# Patient Record
Sex: Female | Born: 1995 | Race: White | Hispanic: No | Marital: Married | Smoking: Former smoker
Health system: Southern US, Community
[De-identification: ages and names within clinical notes are randomized; demographics above are authoritative.]

## PROBLEM LIST (undated history)

## (undated) DIAGNOSIS — O24419 Gestational diabetes mellitus in pregnancy, unspecified control: Secondary | ICD-10-CM

## (undated) DIAGNOSIS — O2230 Deep phlebothrombosis in pregnancy, unspecified trimester: Secondary | ICD-10-CM

## (undated) DIAGNOSIS — R112 Nausea with vomiting, unspecified: Secondary | ICD-10-CM

## (undated) DIAGNOSIS — G43909 Migraine, unspecified, not intractable, without status migrainosus: Secondary | ICD-10-CM

## (undated) DIAGNOSIS — Z9889 Other specified postprocedural states: Secondary | ICD-10-CM

## (undated) DIAGNOSIS — L309 Dermatitis, unspecified: Secondary | ICD-10-CM

## (undated) DIAGNOSIS — J302 Other seasonal allergic rhinitis: Secondary | ICD-10-CM

## (undated) DIAGNOSIS — Z8489 Family history of other specified conditions: Secondary | ICD-10-CM

## (undated) DIAGNOSIS — G47419 Narcolepsy without cataplexy: Secondary | ICD-10-CM

## (undated) DIAGNOSIS — J45909 Unspecified asthma, uncomplicated: Secondary | ICD-10-CM

## (undated) DIAGNOSIS — F4310 Post-traumatic stress disorder, unspecified: Secondary | ICD-10-CM

## (undated) HISTORY — DX: Nausea with vomiting, unspecified: R11.2

## (undated) HISTORY — DX: Narcolepsy without cataplexy: G47.419

## (undated) HISTORY — PX: WISDOM TOOTH EXTRACTION: SHX21

## (undated) HISTORY — DX: Dermatitis, unspecified: L30.9

## (undated) HISTORY — DX: Deep phlebothrombosis in pregnancy, unspecified trimester: O22.30

## (undated) HISTORY — DX: Migraine, unspecified, not intractable, without status migrainosus: G43.909

## (undated) HISTORY — DX: Family history of other specified conditions: Z84.89

## (undated) HISTORY — DX: Post-traumatic stress disorder, unspecified: F43.10

## (undated) HISTORY — DX: Nausea with vomiting, unspecified: Z98.890

## (undated) HISTORY — DX: Gestational diabetes mellitus in pregnancy, unspecified control: O24.419

## (undated) HISTORY — PX: TONSILLECTOMY: SUR1361

---
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
## 2018-01-04 ENCOUNTER — Other Ambulatory Visit: Payer: Self-pay

## 2018-01-04 ENCOUNTER — Emergency Department (HOSPITAL_COMMUNITY)
Admission: EM | Admit: 2018-01-04 | Discharge: 2018-01-04 | Disposition: A | Discharge status: Home / Self Care | Payer: Self-pay | Attending: Emergency Medicine | Admitting: Emergency Medicine

## 2018-01-04 ENCOUNTER — Encounter (HOSPITAL_COMMUNITY): Payer: Self-pay

## 2018-01-04 DIAGNOSIS — R112 Nausea with vomiting, unspecified: Secondary | ICD-10-CM | POA: Insufficient documentation

## 2018-01-04 DIAGNOSIS — R69 Illness, unspecified: Secondary | ICD-10-CM

## 2018-01-04 DIAGNOSIS — R197 Diarrhea, unspecified: Secondary | ICD-10-CM | POA: Insufficient documentation

## 2018-01-04 DIAGNOSIS — J111 Influenza due to unidentified influenza virus with other respiratory manifestations: Secondary | ICD-10-CM

## 2018-01-04 DIAGNOSIS — R5383 Other fatigue: Secondary | ICD-10-CM | POA: Insufficient documentation

## 2018-01-04 DIAGNOSIS — R52 Pain, unspecified: Secondary | ICD-10-CM | POA: Insufficient documentation

## 2018-01-04 LAB — PREGNANCY, URINE: Preg Test, Ur: NEGATIVE

## 2018-01-04 LAB — COMPREHENSIVE METABOLIC PANEL
ALBUMIN: 4 g/dL (ref 3.5–5.0)
ALT: 16 U/L (ref 14–54)
ANION GAP: 11 (ref 5–15)
AST: 21 U/L (ref 15–41)
Alkaline Phosphatase: 85 U/L (ref 38–126)
BUN: 18 mg/dL (ref 6–20)
CHLORIDE: 104 mmol/L (ref 101–111)
CO2: 23 mmol/L (ref 22–32)
Calcium: 8.7 mg/dL — ABNORMAL LOW (ref 8.9–10.3)
Creatinine, Ser: 0.67 mg/dL (ref 0.44–1.00)
GFR calc Af Amer: >60 mL/min (ref >60)
GFR calc non Af Amer: >60 mL/min (ref >60)
GLUCOSE: 99 mg/dL (ref 65–99)
POTASSIUM: 3.7 mmol/L (ref 3.5–5.1)
Sodium: 138 mmol/L (ref 135–145)
Total Bilirubin: 1 mg/dL (ref 0.3–1.2)
Total Protein: 7 g/dL (ref 6.5–8.1)

## 2018-01-04 LAB — URINALYSIS, ROUTINE W REFLEX MICROSCOPIC
Bilirubin Urine: NEGATIVE
Glucose, UA: NEGATIVE mg/dL
Hgb urine dipstick: NEGATIVE
Ketones, ur: NEGATIVE mg/dL
Nitrite: NEGATIVE
Protein, ur: 30 mg/dL — AB
Specific Gravity, Urine: 1.034 — ABNORMAL HIGH (ref 1.005–1.030)
pH: 5 (ref 5.0–8.0)

## 2018-01-04 LAB — TSH: TSH: 0.71 u[IU]/mL (ref 0.350–4.500)

## 2018-01-04 LAB — CBC
HCT: 45.1 % (ref 36.0–46.0)
Hemoglobin: 14.9 g/dL (ref 12.0–15.0)
MCH: 27.7 pg (ref 26.0–34.0)
MCHC: 33 g/dL (ref 30.0–36.0)
MCV: 84 fL (ref 78.0–100.0)
Platelets: 184 10*3/uL (ref 150–400)
RBC: 5.37 MIL/uL — ABNORMAL HIGH (ref 3.87–5.11)
RDW: 13.2 % (ref 11.5–15.5)
WBC: 6.9 10*3/uL (ref 4.0–10.5)

## 2018-01-04 LAB — LIPASE, BLOOD: LIPASE: 28 U/L (ref 11–51)

## 2018-01-04 LAB — T4, FREE: Free T4: 0.8 ng/dL (ref 0.61–1.12)

## 2018-01-04 MED ORDER — KETOROLAC TROMETHAMINE 30 MG/ML IJ SOLN
15.0000 mg | Freq: Once | INTRAMUSCULAR | Status: DC
  Filled 2018-01-04: qty 1

## 2018-01-04 MED ORDER — KETOROLAC TROMETHAMINE 30 MG/ML IJ SOLN
15.0000 mg | Freq: Once | INTRAMUSCULAR | Status: AC
  Administered 2018-01-04: 15 mg via INTRAVENOUS

## 2018-01-04 MED ORDER — TRAMADOL HCL 50 MG PO TABS
50.0000 mg | ORAL_TABLET | Freq: Once | ORAL | Status: AC
  Administered 2018-01-04: 50 mg via ORAL
  Filled 2018-01-04: qty 1

## 2018-01-04 NOTE — ED Provider Notes (Signed)
[facility] Provider Note   CSN: [id number] Arrival date & time: 01/04/18  1040     History   Chief Complaint Chief Complaint  Patient presents with  . Generalized Body Aches  . Emesis    HPI Amy [name] is a 22 y.o. female.  HPI   Patient presents with concern of 3/4 weeks of illness. Prior to the onset she was in her usual state of health, and denies any chronic medical problems. Now, over the past few weeks she has had persistent nausea, anorexia, diminished p.o. intake due to this. There is concurrent fatigue, lack of previously normal energy. Patient also comments of ongoing intermittent loose stool, and has had episodic vomiting as well. Vomiting is approximately 1 hour postprandial, and random. No focal abdominal pain, no chest pain, no dyspnea. There is associated headache, though she notes a long history of migraine headache. This headache is not as severe as her migraines, but is in the posterior midline occiput and neck. No relief with OTC medication. No vision changes, no confusion, no disorientation. Patient does not smoke, drinks only socially.  History reviewed. No pertinent past medical history.  There are no active problems to display for this patient.   Past Surgical History:  Procedure Laterality Date  . TONSILLECTOMY    . WISDOM TOOTH EXTRACTION      OB History    No data available       Home Medications    Prior to Admission medications   Not on File    Family History No family history on file.  Social History Social History   Tobacco Use  . Smoking status: Never Smoker  . Smokeless tobacco: Never Used  Substance Use Topics  . Alcohol use: No    Frequency: Never  . Drug use: No     Allergies   Cough syrup [guaifenesin]   Review of Systems Review of Systems  Constitutional:       Per HPI, otherwise negative  HENT:       Per HPI, otherwise negative  Respiratory:       Per HPI, otherwise  negative  Cardiovascular:       Per HPI, otherwise negative  Gastrointestinal: Positive for diarrhea, nausea and vomiting. Negative for abdominal pain.  Endocrine:       Negative aside from HPI  Genitourinary:       Neg aside from HPI   Musculoskeletal:       Per HPI, otherwise negative  Skin: Negative.   Neurological: Positive for headaches. Negative for syncope.     Physical Exam Updated Vital Signs BP 124/69 (BP Location: Left Arm)   Pulse 90   Temp 99 F (37.2 C) (Oral)   Resp 18   Ht 5\' 3"  (1.6 m)   Wt 74.4 kg (164 lb 1.6 oz)   LMP 11/23/2017   SpO2 100%   BMI 29.07 kg/m   Physical Exam  Constitutional: She is oriented to person, place, and time. She appears well-developed and well-nourished. No distress.  HENT:  Head: Normocephalic and atraumatic.  Eyes: Conjunctivae and EOM are normal.  Neck: Normal range of motion. Neck supple. No spinous process tenderness and no muscular tenderness present. No neck rigidity. No edema, no erythema and normal range of motion present.  Cardiovascular: Normal rate and regular rhythm.  Pulmonary/Chest: Effort normal and breath sounds normal. No stridor. No respiratory distress.  Abdominal: She exhibits no distension.  Musculoskeletal: She exhibits no edema.  Neurological:  She is alert and oriented to person, place, and time. No cranial nerve deficit.  Skin: Skin is warm and dry.  Psychiatric: She has a normal mood and affect.  Nursing note and vitals reviewed.    ED Treatments / Results  Labs (all labs ordered are listed, but only abnormal results are displayed) Labs Reviewed  COMPREHENSIVE METABOLIC PANEL - Abnormal; Notable for the following components:      Result Value   Calcium 8.7 (*)    All other components within normal limits  CBC - Abnormal; Notable for the following components:   RBC 5.37 (*)    All other components within normal limits  URINALYSIS, ROUTINE W REFLEX MICROSCOPIC - Abnormal; Notable for the  following components:   Color, Urine AMBER (*)    APPearance CLOUDY (*)    Specific Gravity, Urine 1.034 (*)    Protein, ur 30 (*)    Leukocytes, UA SMALL (*)    Bacteria, UA RARE (*)    Squamous Epithelial / LPF TOO NUMEROUS TO COUNT (*)    All other components within normal limits  LIPASE, BLOOD  PREGNANCY, URINE  TSH  T4, FREE     Procedures Procedures (including critical care time)  Medications Ordered in ED Medications  traMADol (ULTRAM) tablet 50 mg (not administered)  ketorolac (TORADOL) 30 MG/ML injection 15 mg (not administered)     Initial Impression / Assessment and Plan / ED Course  I have reviewed the triage vital signs and the nursing notes.  Pertinent labs & imaging results that were available during my care of the patient were reviewed by me and considered in my medical decision making (see chart for details).  4:16 PM Awake alert in no distress speaking clearly. No new complaints. We discussed all findings, including reassuring labs, urinalysis. With no abnormal breath sounds, and non-smoking status, no x-ray indicated.  This young female presents with ongoing generalized illness including nausea, vomiting, diarrhea, but has a reassuring physical exam, vital signs, and given the time course, though she may have previously had influenza, no indication for antiviral. After hours of monitoring with no decompensation, no new complaints, the patient was discharged with close outpatient follow-up.  Final Clinical Impressions(s) / ED Diagnoses  Influenza-like illness   Gerhard Munch, MD 01/04/18 [phone]

## 2018-01-04 NOTE — ED Triage Notes (Signed)
Reports of body aches, vomiting, diarrhea and headache x3 weeks.

## 2018-01-04 NOTE — Discharge Instructions (Signed)
As discussed, your evaluation today has been largely reassuring.  But, it is important that you monitor your condition carefully, and do not hesitate to return to the ED if you develop new, or concerning changes in your condition. ? ?Otherwise, please follow-up with your physician for appropriate ongoing care. ? ?

## 2018-01-26 ENCOUNTER — Encounter (HOSPITAL_COMMUNITY): Payer: Self-pay

## 2018-01-26 ENCOUNTER — Emergency Department (HOSPITAL_COMMUNITY)
Admission: EM | Admit: 2018-01-26 | Discharge: 2018-01-26 | Disposition: A | Discharge status: Home / Self Care | Payer: Self-pay | Attending: Emergency Medicine | Admitting: Emergency Medicine

## 2018-01-26 DIAGNOSIS — R112 Nausea with vomiting, unspecified: Secondary | ICD-10-CM | POA: Insufficient documentation

## 2018-01-26 DIAGNOSIS — R197 Diarrhea, unspecified: Secondary | ICD-10-CM | POA: Insufficient documentation

## 2018-01-26 DIAGNOSIS — J4531 Mild persistent asthma with (acute) exacerbation: Secondary | ICD-10-CM | POA: Insufficient documentation

## 2018-01-26 DIAGNOSIS — R062 Wheezing: Secondary | ICD-10-CM

## 2018-01-26 DIAGNOSIS — Z79899 Other long term (current) drug therapy: Secondary | ICD-10-CM | POA: Insufficient documentation

## 2018-01-26 HISTORY — DX: Unspecified asthma, uncomplicated: J45.909

## 2018-01-26 MED ORDER — ONDANSETRON 4 MG PO TBDP: 4.0000 mg | ORAL_TABLET | Freq: Three times a day (TID) | ORAL | 0 refills | Status: DC | PRN

## 2018-01-26 MED ORDER — ALBUTEROL SULFATE (2.5 MG/3ML) 0.083% IN NEBU
5.0000 mg | INHALATION_SOLUTION | Freq: Once | RESPIRATORY_TRACT | Status: AC
  Administered 2018-01-26: 5 mg via RESPIRATORY_TRACT
  Filled 2018-01-26: qty 6

## 2018-01-26 MED ORDER — PREDNISONE 20 MG PO TABS: 40.0000 mg | ORAL_TABLET | Freq: Every day | ORAL | 0 refills | Status: DC

## 2018-01-26 MED ORDER — ONDANSETRON 4 MG PO TBDP
4.0000 mg | ORAL_TABLET | Freq: Once | ORAL | Status: AC
  Administered 2018-01-26: 4 mg via ORAL
  Filled 2018-01-26: qty 1

## 2018-01-26 NOTE — Discharge Instructions (Signed)
Zofran every 6 hours as needed Albuterol every 4 hours as needed for wheezing Prednisone daily for 5 days  Out of work until not having vomiting or diarrhea

## 2018-01-26 NOTE — ED Provider Notes (Signed)
[facility] EMERGENCY DEPARTMENT Provider Note   CSN: [id number] Arrival date & time: 01/26/18  1029     History   Chief Complaint Chief Complaint  Patient presents with  . Emesis  . Diarrhea    HPI ARLISS HEPBURN is a 22 y.o. female.  HPI  The patient is a 22 year old female, she has a history of asthma, she works at [employer] and states that over the last several days she has had several coworkers which have gone out with vomiting and diarrhea, last night she developed vomiting and diarrhea and had a fever of 102, that has subsided this morning but she still has ongoing watery diarrhea which is nonbloody as well as multiple episodes of vomiting when she tries to eat food.  She does report that she has been able to drink over a gallon of water yesterday and has been able to hold that down without any difficulty.  She is also noticed an increase in the amount of wheezing and coughing that she is doing over the last several days.  She does use her rescue inhaler as needed and uses Singulair for coughing related to allergies.  She still uses those medications.   Past Medical History:  Diagnosis Date  . Asthma     There are no active problems to display for this patient.   Past Surgical History:  Procedure Laterality Date  . TONSILLECTOMY    . WISDOM TOOTH EXTRACTION      OB History    No data available       Home Medications    Prior to Admission medications   Medication Sig Start Date End Date Taking? Authorizing Provider  acetaminophen (TYLENOL) 325 MG tablet Take 650 mg by mouth every 6 (six) hours as needed for mild pain or headache.   Yes [provider]  albuterol (PROVENTIL HFA;VENTOLIN HFA) 108 (90 Base) MCG/ACT inhaler Inhale 1-2 puffs into the lungs every 6 (six) hours as needed for wheezing or shortness of breath.   Yes [provider]  Cinnamon 500 MG capsule Take 500 mg by mouth daily.   Yes [provider]  magnesium  gluconate (MAGONATE) 500 MG tablet Take 500 mg by mouth daily.   Yes [provider]  OLIVE LEAF EXTRACT PO Take 1 capsule by mouth daily.   Yes [provider]  vitamin C (ASCORBIC ACID) 500 MG tablet Take 500 mg by mouth daily.   Yes [provider]  montelukast (SINGULAIR) 10 MG tablet Take 10 mg by mouth at bedtime.    [provider]  ondansetron (ZOFRAN ODT) 4 MG disintegrating tablet Take 1 tablet (4 mg total) by mouth every 8 (eight) hours as needed for nausea. 01/26/18   Eber Hong, MD  predniSONE (DELTASONE) 20 MG tablet Take 2 tablets (40 mg total) by mouth daily. 01/26/18   Eber Hong, MD    Family History No family history on file.  Social History Social History   Tobacco Use  . Smoking status: Never Smoker  . Smokeless tobacco: Never Used  Substance Use Topics  . Alcohol use: Yes    Frequency: Never    Comment: occ  . Drug use: No     Allergies   Cough syrup [guaifenesin] and Other   Review of Systems Review of Systems  All other systems reviewed and are negative.    Physical Exam Updated Vital Signs BP 132/62   Pulse 77   Temp 98.6 F (37 C) (Oral)  Resp 18   Ht 5\' 3"  (1.6 m)   Wt 74.1 kg (163 lb 6.4 oz)   LMP 01/19/2018 (Exact Date)   SpO2 98%   BMI 28.95 kg/m   Physical Exam  Constitutional: She appears well-developed and well-nourished. No distress.  HENT:  Head: Normocephalic and atraumatic.  Mouth/Throat: Oropharynx is clear and moist. No oropharyngeal exudate.  Oral cavity is clear, moist, no redness exudate or asymmetry  Eyes: Conjunctivae and EOM are normal. Pupils are equal, round, and reactive to light. Right eye exhibits no discharge. Left eye exhibits no discharge. No scleral icterus.  Neck: Normal range of motion. Neck supple. No JVD present. No thyromegaly present.  Cardiovascular: Normal rate, regular rhythm, normal heart sounds and intact distal pulses. Exam reveals no gallop and no friction  rub.  No murmur heard. Pulmonary/Chest: Effort normal. No respiratory distress. She has wheezes ( The patient has expiratory wheezing but is able to speak in full sentences without any increased work of breathing or distress). She has no rales.  Abdominal: Soft. Bowel sounds are normal. She exhibits no distension and no mass. There is tenderness ( Mild diffuse abdominal tenderness, there is no guarding, the abdomen is very soft, there is no focal tenderness in the right lower quadrant and there is no Murphy sign).  Musculoskeletal: Normal range of motion. She exhibits no edema or tenderness.  No edema  Lymphadenopathy:    She has no cervical adenopathy.  Neurological: She is alert. Coordination normal.  The patient is alert, she is able to follow all my commands, she is able to walk without any difficulty with normal balance and normal speech  Skin: Skin is warm and dry. No rash noted. No erythema.  Psychiatric: She has a normal mood and affect. Her behavior is normal.  Nursing note and vitals reviewed.    ED Treatments / Results  Labs (all labs ordered are listed, but only abnormal results are displayed) Labs Reviewed - No data to display   Radiology No results found.  Procedures Procedures (including critical care time)  Medications Ordered in ED Medications  ondansetron (ZOFRAN-ODT) disintegrating tablet 4 mg (4 mg Oral Given 01/26/18 1154)  albuterol (PROVENTIL) (2.5 MG/3ML) 0.083% nebulizer solution 5 mg (5 mg Nebulization Given 01/26/18 1226)     Initial Impression / Assessment and Plan / ED Course  I have reviewed the triage vital signs and the nursing notes.  Pertinent labs & imaging results that were available during my care of the patient were reviewed by me and considered in my medical decision making (see chart for details).     The patient has what appears to be a gastroenteritis, she is afebrile here however with her story of fever with vomiting and diarrhea I  suspect this is a viral cause.  Additionally she has some wheezing for which she will get a nebulized treatment however she does not appear to be in distress from this and has ongoing treatment at home.  Zofran given in the emergency department, Nebulized treatment of albuterol given  Anticipate discharge home.  Improved with meds ,  Vitals:   01/26/18 1045 01/26/18 1227 01/26/18 1251  BP: 111/68  132/62  Pulse: 96  77  Resp: 18  18  Temp: 98.6 F (37 C)    TempSrc: Oral    SpO2: 97% 98% 98%  Weight: 74.1 kg (163 lb 6.4 oz)    Height: 5\' 3"  (1.6 m)       Final Clinical Impressions(s) /  ED Diagnoses   Final diagnoses:  Nausea vomiting and diarrhea  Wheezing  Mild persistent asthma with exacerbation    ED Discharge Orders        Ordered    ondansetron (ZOFRAN ODT) 4 MG disintegrating tablet  Every 8 hours PRN     01/26/18 1312    predniSONE (DELTASONE) 20 MG tablet  Daily     01/26/18 1312       Eber HongMiller, Ignacio Lowder, MD 01/26/18 1313

## 2018-01-26 NOTE — ED Triage Notes (Signed)
Pt reports n/v/d abd pain, and sob since last night.  Reports a lot of her coworkers have had the same symptoms.

## 2018-03-02 ENCOUNTER — Encounter (HOSPITAL_COMMUNITY): Payer: Self-pay | Admitting: *Deleted

## 2018-03-02 ENCOUNTER — Emergency Department (HOSPITAL_COMMUNITY): Payer: Self-pay

## 2018-03-02 ENCOUNTER — Emergency Department (HOSPITAL_COMMUNITY)
Admission: EM | Admit: 2018-03-02 | Discharge: 2018-03-02 | Disposition: A | Discharge status: Home / Self Care | Payer: Self-pay | Attending: Emergency Medicine | Admitting: Emergency Medicine

## 2018-03-02 DIAGNOSIS — J45909 Unspecified asthma, uncomplicated: Secondary | ICD-10-CM | POA: Insufficient documentation

## 2018-03-02 DIAGNOSIS — X501XXA Overexertion from prolonged static or awkward postures, initial encounter: Secondary | ICD-10-CM | POA: Insufficient documentation

## 2018-03-02 DIAGNOSIS — Y929 Unspecified place or not applicable: Secondary | ICD-10-CM | POA: Insufficient documentation

## 2018-03-02 DIAGNOSIS — S8392XA Sprain of unspecified site of left knee, initial encounter: Secondary | ICD-10-CM | POA: Insufficient documentation

## 2018-03-02 DIAGNOSIS — Z79899 Other long term (current) drug therapy: Secondary | ICD-10-CM | POA: Insufficient documentation

## 2018-03-02 DIAGNOSIS — Y999 Unspecified external cause status: Secondary | ICD-10-CM | POA: Insufficient documentation

## 2018-03-02 DIAGNOSIS — Y9389 Activity, other specified: Secondary | ICD-10-CM | POA: Insufficient documentation

## 2018-03-02 HISTORY — DX: Other seasonal allergic rhinitis: J30.2

## 2018-03-02 MED ORDER — DICLOFENAC SODIUM 50 MG PO TBEC: 50.0000 mg | DELAYED_RELEASE_TABLET | Freq: Two times a day (BID) | ORAL | 0 refills | Status: DC

## 2018-03-02 NOTE — ED Notes (Signed)
Hx of knee problems  Getting out of [id number] truck when knee gave way and she hit the running board with same knee

## 2018-03-02 NOTE — ED Triage Notes (Signed)
Pt states her left knee gave out while getting out of her truck today and fell landing on that same knee.  Pt states she is able to put weight on it but very painful.

## 2018-03-02 NOTE — Discharge Instructions (Signed)
Return if any problems.

## 2018-03-02 NOTE — ED Provider Notes (Signed)
[facility] EMERGENCY DEPARTMENT Provider Note   CSN: [id number] Arrival date & time: 03/02/18  1712     History   Chief Complaint Chief Complaint  Patient presents with  . Knee Pain    left    HPI Amy [name] is a 22 y.o. female.  The history is provided by the patient. No language interpreter was used.  Knee Pain   This is a new problem. The current episode started 1 to 2 hours ago. The problem occurs constantly. The problem has been gradually worsening. The pain is present in the left knee. The quality of the pain is described as aching. The pain is moderate. Associated symptoms include limited range of motion. She has tried nothing for the symptoms. The treatment provided no relief. There has been a history of trauma.  Pt reports she slipped on a running board and twisted/hit her knee. Pt complains of swelling and pain.    Past Medical History:  Diagnosis Date  . Asthma   . Seasonal allergies     There are no active problems to display for this patient.   Past Surgical History:  Procedure Laterality Date  . TONSILLECTOMY    . WISDOM TOOTH EXTRACTION      OB History    No data available       Home Medications    Prior to Admission medications   Medication Sig Start Date End Date Taking? Authorizing Provider  acetaminophen (TYLENOL) 325 MG tablet Take 650 mg by mouth every 6 (six) hours as needed for mild pain or headache.    [provider]  albuterol (PROVENTIL HFA;VENTOLIN HFA) 108 (90 Base) MCG/ACT inhaler Inhale 1-2 puffs into the lungs every 6 (six) hours as needed for wheezing or shortness of breath.    [provider]  Cinnamon 500 MG capsule Take 500 mg by mouth daily.    [provider]  magnesium gluconate (MAGONATE) 500 MG tablet Take 500 mg by mouth daily.    [provider]  montelukast (SINGULAIR) 10 MG tablet Take 10 mg by mouth at bedtime.    [provider]  OLIVE LEAF EXTRACT PO Take 1  capsule by mouth daily.    [provider]  ondansetron (ZOFRAN ODT) 4 MG disintegrating tablet Take 1 tablet (4 mg total) by mouth every 8 (eight) hours as needed for nausea. 01/26/18   Eber Hong, MD  predniSONE (DELTASONE) 20 MG tablet Take 2 tablets (40 mg total) by mouth daily. 01/26/18   Eber Hong, MD  vitamin C (ASCORBIC ACID) 500 MG tablet Take 500 mg by mouth daily.    [provider]    Family History History reviewed. No pertinent family history.  Social History Social History   Tobacco Use  . Smoking status: Never Smoker  . Smokeless tobacco: Never Used  Substance Use Topics  . Alcohol use: Yes    Frequency: Never    Comment: occ  . Drug use: No     Allergies   Cough syrup [guaifenesin] and Other   Review of Systems Review of Systems  All other systems reviewed and are negative.    Physical Exam Updated Vital Signs BP 113/81   Pulse 81   Temp 97.8 F (36.6 C) (Oral)   Resp 18   Ht 5\' 3"  (1.6 m)   Wt 63.5 kg (140 lb)   LMP 02/21/2018   SpO2 100%   BMI 24.80 kg/m   Physical Exam  Constitutional: She is  oriented to person, place, and time. She appears well-developed and well-nourished.  HENT:  Head: Normocephalic.  Musculoskeletal: She exhibits edema and tenderness.  Neurological: She is alert and oriented to person, place, and time.  Skin: Skin is warm.  Psychiatric: She has a normal mood and affect.  Nursing note and vitals reviewed.    ED Treatments / Results  Labs (all labs ordered are listed, but only abnormal results are displayed) Labs Reviewed - No data to display  EKG  EKG Interpretation None       Radiology Dg Knee Complete 4 Views Left  Result Date: 03/02/2018 CLINICAL DATA:  Pain after trauma EXAM: LEFT KNEE - COMPLETE 4+ VIEW COMPARISON:  None. FINDINGS: There is a well corticated lucency in the patella. No acute fractures. No joint effusions. No degenerative changes. IMPRESSION: No acute  abnormalities. The well corticated lucency in the patella appears nonacute could be congenital or from previous trauma. Electronically Signed   By: Gerome Samavid  Williams III M.D   On: 03/02/2018 18:02    Procedures Procedures (including critical care time)  Medications Ordered in ED Medications - No data to display   Initial Impression / Assessment and Plan / ED Course  I have reviewed the triage vital signs and the nursing notes.  Pertinent labs & imaging results that were available during my care of the patient were reviewed by me and considered in my medical decision making (see chart for details).     Knee immbolizer placed MDM   Xray shows no acute fracture.  I suspect sprain/tear. Pt placed in immobilizer.  Pt advised to call Dr. Romeo AppleHarrison tomorrow to be seen for evaluation.   Final Clinical Impressions(s) / ED Diagnoses   Final diagnoses:  Sprain of left knee, unspecified ligament, initial encounter    ED Discharge Orders    None    An After Visit Summary was printed and given to the patient.    Elson AreasSofia, Krysti Hickling K, [place] 03/02/18 Corky Crafts1955    Eber HongMiller, Brian, MD 03/04/18 [phone]

## 2018-11-04 ENCOUNTER — Encounter (HOSPITAL_COMMUNITY): Payer: Self-pay | Admitting: Emergency Medicine

## 2018-11-04 ENCOUNTER — Other Ambulatory Visit: Payer: Self-pay

## 2018-11-04 ENCOUNTER — Emergency Department (HOSPITAL_COMMUNITY)
Admission: EM | Admit: 2018-11-04 | Discharge: 2018-11-04 | Disposition: A | Discharge status: Home / Self Care | Payer: Self-pay | Attending: Emergency Medicine | Admitting: Emergency Medicine

## 2018-11-04 DIAGNOSIS — L309 Dermatitis, unspecified: Secondary | ICD-10-CM | POA: Insufficient documentation

## 2018-11-04 DIAGNOSIS — J4541 Moderate persistent asthma with (acute) exacerbation: Secondary | ICD-10-CM | POA: Insufficient documentation

## 2018-11-04 DIAGNOSIS — Z79899 Other long term (current) drug therapy: Secondary | ICD-10-CM | POA: Insufficient documentation

## 2018-11-04 DIAGNOSIS — L0100 Impetigo, unspecified: Secondary | ICD-10-CM | POA: Insufficient documentation

## 2018-11-04 MED ORDER — ALBUTEROL SULFATE HFA 108 (90 BASE) MCG/ACT IN AERS
2.0000 | INHALATION_SPRAY | RESPIRATORY_TRACT | Status: DC
  Administered 2018-11-04: 2 via RESPIRATORY_TRACT
  Filled 2018-11-04: qty 6.7

## 2018-11-04 MED ORDER — SULFAMETHOXAZOLE-TRIMETHOPRIM 800-160 MG PO TABS: 1.0000 | ORAL_TABLET | Freq: Two times a day (BID) | ORAL | 0 refills | Status: AC

## 2018-11-04 MED ORDER — IPRATROPIUM-ALBUTEROL 0.5-2.5 (3) MG/3ML IN SOLN
3.0000 mL | Freq: Once | RESPIRATORY_TRACT | Status: AC
  Administered 2018-11-04: 3 mL via RESPIRATORY_TRACT
  Filled 2018-11-04: qty 3

## 2018-11-04 NOTE — Discharge Instructions (Signed)
Return if any problems.

## 2018-11-04 NOTE — Progress Notes (Signed)
**Note De-Identified Maely Clements Obfuscation** Education for MDI received well by patient and mother. Flow measured for adequate medication delivery. Patient tolerated well.

## 2018-11-04 NOTE — ED Notes (Signed)
Pt called out, states she can't breathe, RA sats at 100 %, pt states hx of asthma, inp. And exp. Wheezing noted.  Neb orders placed and respiratory called.

## 2018-11-04 NOTE — ED Triage Notes (Signed)
Patient complaining of rash to left side of face starting Friday. States she also has rash to other areas of her body "for years." States "my face rash started when my cat touched me."

## 2018-11-05 NOTE — ED Provider Notes (Signed)
[facility] Provider Note   CSN: [id number] Arrival date & time: 11/04/18  1008     History   Chief Complaint Chief Complaint  Patient presents with  . Rash  . Asthma    HPI Amy [name] is a 22 y.o. female.  The history is provided by the patient. No language interpreter was used.  Rash   This is a new problem. The current episode started yesterday. The problem has been gradually worsening. There has been no fever. The fever has been present for less than 1 day. The rash is present on the face and back. The pain is moderate. The pain has been constant since onset. Associated symptoms include blisters.  Asthma  This is a recurrent problem. The problem occurs constantly. The problem has been gradually worsening. Nothing aggravates the symptoms. Nothing relieves the symptoms. She has tried nothing for the symptoms. The treatment provided no relief.  Pt reports she lives in a dirty house that triggers her asthma.  Pt reports she is allergic to cat and home owner has a cat. Pt does not have anywhere else to stay.   Past Medical History:  Diagnosis Date  . Asthma   . Seasonal allergies     There are no active problems to display for this patient.   Past Surgical History:  Procedure Laterality Date  . TONSILLECTOMY    . WISDOM TOOTH EXTRACTION       OB History   None      Home Medications    Prior to Admission medications   Medication Sig Start Date End Date Taking? Authorizing Provider  albuterol (PROVENTIL HFA;VENTOLIN HFA) 108 (90 Base) MCG/ACT inhaler Inhale 1-2 puffs into the lungs every 6 (six) hours as needed for wheezing or shortness of breath.    [provider]  diclofenac (VOLTAREN) 50 MG EC tablet Take 1 tablet (50 mg total) by mouth 2 (two) times daily. 03/02/18   Elson AreasSofia, Brandey Vandalen K, PA-C  sulfamethoxazole-trimethoprim (BACTRIM DS,SEPTRA DS) 800-160 MG tablet Take 1 tablet by mouth 2 (two) times daily for 10 days. 11/04/18  11/14/18  Elson AreasSofia, Kallyn Demarcus K, PA-C    Family History History reviewed. No pertinent family history.  Social History Social History   Tobacco Use  . Smoking status: Never Smoker  . Smokeless tobacco: Never Used  Substance Use Topics  . Alcohol use: Yes    Frequency: Never    Comment: occ  . Drug use: No     Allergies   Cough syrup [guaifenesin] and Other   Review of Systems Review of Systems  Skin: Positive for rash.  All other systems reviewed and are negative.    Physical Exam Updated Vital Signs BP (!) 144/83 (BP Location: Right Arm)   Pulse 100   Temp 98.2 F (36.8 C) (Oral)   Resp 17   Ht 5\' 4"  (1.626 m)   Wt 70.8 kg   SpO2 100%   BMI 26.78 kg/m   Physical Exam  Constitutional: She is oriented to person, place, and time. She appears well-developed and well-nourished.  HENT:  Head: Normocephalic.  Eyes: EOM are normal.  Neck: Normal range of motion.  Cardiovascular: Normal rate and regular rhythm.  Pulmonary/Chest: She has wheezes.  Abdominal: She exhibits no distension.  Musculoskeletal: Normal range of motion.  Neurological: She is alert and oriented to person, place, and time.  Skin: Skin is warm.  Multiple scabbed area and open sores, face, back,  Dried rash full body  Psychiatric: She has a normal mood and affect.  Nursing note and vitals reviewed.    ED Treatments / Results  Labs (all labs ordered are listed, but only abnormal results are displayed) Labs Reviewed - No data to display  EKG None  Radiology No results found.  Procedures Procedures (including critical care time)  Medications Ordered in ED Medications  ipratropium-albuterol (DUONEB) 0.5-2.5 (3) MG/3ML nebulizer solution 3 mL (3 mLs Nebulization Given 11/04/18 1141)     Initial Impression / Assessment and Plan / ED Course  I have reviewed the triage vital signs and the nursing notes.  Pertinent labs & imaging results that were available during my care of the patient  were reviewed by me and considered in my medical decision making (see chart for details).    MDM Pt appears to have eczema,  Pt has multiple sores that appear to be picked open.  Pt has some honeycombed crusting  Pt feels better after albuterol.  Pt given rx for bactrim for skin infection.  Pt reports she is allergic to prednisone, topical and oral.   Pt given albuterol inhaler here.  Pt given a Facilities manager.  Final Clinical Impressions(s) / ED Diagnoses   Final diagnoses:  Moderate persistent asthma with exacerbation  Eczema, unspecified type  Impetigo    ED Discharge Orders         Ordered    sulfamethoxazole-trimethoprim (BACTRIM DS,SEPTRA DS) 800-160 MG tablet  2 times daily     11/04/18 1159        An After Visit Summary was printed and given to the patient.    Elson Areas, [place] 11/05/18 1056    Bethann Berkshire, MD 11/06/18 1320

## 2018-11-22 ENCOUNTER — Other Ambulatory Visit: Payer: Self-pay

## 2018-11-22 ENCOUNTER — Emergency Department (HOSPITAL_COMMUNITY)
Admission: EM | Admit: 2018-11-22 | Discharge: 2018-11-23 | Disposition: A | Discharge status: Home / Self Care | Payer: Self-pay | Attending: Emergency Medicine | Admitting: Emergency Medicine

## 2018-11-22 ENCOUNTER — Encounter (HOSPITAL_COMMUNITY): Payer: Self-pay | Admitting: *Deleted

## 2018-11-22 DIAGNOSIS — L089 Local infection of the skin and subcutaneous tissue, unspecified: Secondary | ICD-10-CM

## 2018-11-22 DIAGNOSIS — L309 Dermatitis, unspecified: Secondary | ICD-10-CM | POA: Insufficient documentation

## 2018-11-22 DIAGNOSIS — J45901 Unspecified asthma with (acute) exacerbation: Secondary | ICD-10-CM

## 2018-11-22 DIAGNOSIS — J4541 Moderate persistent asthma with (acute) exacerbation: Secondary | ICD-10-CM | POA: Insufficient documentation

## 2018-11-22 NOTE — ED Triage Notes (Signed)
Pt c/o rash to left side of facial area and neck that started a few weeks ago along with rash to buttock region. Was seen in er 11/04/2018, given bactrim that she completed but the rash returned,

## 2018-11-23 MED ORDER — SULFAMETHOXAZOLE-TRIMETHOPRIM 800-160 MG PO TABS
1.0000 | ORAL_TABLET | Freq: Once | ORAL | Status: AC
  Administered 2018-11-23: 1 via ORAL
  Filled 2018-11-23: qty 1

## 2018-11-23 MED ORDER — HYDROXYZINE PAMOATE 25 MG PO CAPS: 25.0000 mg | ORAL_CAPSULE | Freq: Three times a day (TID) | ORAL | 0 refills | Status: DC | PRN

## 2018-11-23 MED ORDER — HYDROXYZINE HCL 25 MG PO TABS
25.0000 mg | ORAL_TABLET | Freq: Once | ORAL | Status: AC
  Administered 2018-11-23: 25 mg via ORAL
  Filled 2018-11-23: qty 1

## 2018-11-23 MED ORDER — SULFAMETHOXAZOLE-TRIMETHOPRIM 800-160 MG PO TABS: 1.0000 | ORAL_TABLET | Freq: Two times a day (BID) | ORAL | 0 refills | Status: AC

## 2018-11-23 MED ORDER — ALBUTEROL SULFATE HFA 108 (90 BASE) MCG/ACT IN AERS
2.0000 | INHALATION_SPRAY | Freq: Once | RESPIRATORY_TRACT | Status: AC
  Administered 2018-11-23: 2 via RESPIRATORY_TRACT
  Filled 2018-11-23: qty 6.7

## 2018-11-23 NOTE — ED Notes (Signed)
Respiratory notified.

## 2018-11-23 NOTE — Discharge Instructions (Signed)
Please use vistaril for itching and burning sensation. Use bactrim 2 times daily for infection. Albuterol every 4 hours for wheezing or difficulty with breathing. Please call the [facility] for assistance securing a primary MD.

## 2018-11-23 NOTE — ED Notes (Signed)
Chaperoned PA Beverely PaceBryant for patient skin assessment.

## 2018-11-23 NOTE — Progress Notes (Signed)
Patient had excellent mdi technique no instruction needed.

## 2018-11-24 NOTE — ED Provider Notes (Signed)
[facility] EMERGENCY DEPARTMENT Provider Note   CSN: [id number] Arrival date & time: 11/22/18  2322     History   Chief Complaint Chief Complaint  Patient presents with  . Rash    HPI Amy [name] is a 22 y.o. female.  Patient is a 22 year old female who presents to the emergency department with rash.  Patient states she has had problems with rash for several years.  She has been seen by dermatology in the past, but has not been given a formal diagnosis.  She says that the dermatology specialist wanted to do testing and biopsies, but she did not allow for these testing.  She was seen in the emergency department approximately November 12 for similar rash,.  She was treated with Bactrim, she says the rash seemed to have gotten better, but after the medication left her system within a issue became worse.  She now has a problem with itching and burning sensation she is doing a lot of scratching, and has pain over some of the rash areas as a result of her scratching so much.  There is been no new clothing, no new dryer sheets or detergent, no new foods, no new medications.  The history is provided by the patient.  Rash      Past Medical History:  Diagnosis Date  . Asthma   . Seasonal allergies     There are no active problems to display for this patient.   Past Surgical History:  Procedure Laterality Date  . TONSILLECTOMY    . WISDOM TOOTH EXTRACTION       OB History   None      Home Medications    Prior to Admission medications   Medication Sig Start Date End Date Taking? Authorizing Provider  albuterol (PROVENTIL HFA;VENTOLIN HFA) 108 (90 Base) MCG/ACT inhaler Inhale 1-2 puffs into the lungs every 6 (six) hours as needed for wheezing or shortness of breath.   Yes [provider]  diclofenac (VOLTAREN) 50 MG EC tablet Take 1 tablet (50 mg total) by mouth 2 (two) times daily. 03/02/18   Elson Areas, PA-C  hydrOXYzine (VISTARIL) 25 MG capsule Take 1  capsule (25 mg total) by mouth 3 (three) times daily as needed. 11/23/18   Ivery Quale, PA-C  sulfamethoxazole-trimethoprim (BACTRIM DS,SEPTRA DS) 800-160 MG tablet Take 1 tablet by mouth 2 (two) times daily for 7 days. 11/23/18 11/30/18  Ivery Quale, PA-C    Family History No family history on file.  Social History Social History   Tobacco Use  . Smoking status: Never Smoker  . Smokeless tobacco: Never Used  Substance Use Topics  . Alcohol use: Yes    Frequency: Never    Comment: occ  . Drug use: No     Allergies   Cough syrup [guaifenesin] and Other   Review of Systems Review of Systems  Constitutional: Negative for activity change.       All ROS Neg except as noted in HPI  HENT: Negative for nosebleeds.   Eyes: Negative for photophobia and discharge.  Respiratory: Negative for cough, shortness of breath and wheezing.   Cardiovascular: Negative for chest pain and palpitations.  Gastrointestinal: Negative for abdominal pain and blood in stool.  Genitourinary: Negative for dysuria, frequency and hematuria.  Musculoskeletal: Negative for arthralgias, back pain and neck pain.  Skin: Positive for rash.  Neurological: Negative for dizziness, seizures and speech difficulty.  Psychiatric/Behavioral: Negative for confusion and hallucinations. The patient is nervous/anxious.  Physical Exam Updated Vital Signs BP 137/76 (BP Location: Left Arm)   Pulse 75   Temp (!) 97.5 F (36.4 C) (Oral)   Resp 18   Ht 5\' 4"  (1.626 m)   Wt 70.8 kg   SpO2 98%   BMI 26.79 kg/m   Physical Exam  Constitutional: She is oriented to person, place, and time. She appears well-developed and well-nourished.  Non-toxic appearance.  HENT:  Head: Normocephalic.  Right Ear: Tympanic membrane and external ear normal.  Left Ear: Tympanic membrane and external ear normal.  Eyes: Pupils are equal, round, and reactive to light. EOM and lids are normal.  Neck: Normal range of motion. Neck  supple. Carotid bruit is not present.  Cardiovascular: Normal rate, regular rhythm, normal heart sounds, intact distal pulses and normal pulses.  Pulmonary/Chest: Breath sounds normal. No respiratory distress. She has no wheezes.  Abdominal: Soft. Bowel sounds are normal. There is no tenderness. There is no guarding.  Musculoskeletal: Normal range of motion.  Lymphadenopathy:       Head (right side): No submandibular adenopathy present.       Head (left side): No submandibular adenopathy present.    She has no cervical adenopathy.  Neurological: She is alert and oriented to person, place, and time. She has normal strength. No cranial nerve deficit or sensory deficit.  Skin: Skin is warm and dry. Rash noted.  There are multiple areas of open school still sores on the chest, the back, the legs, and one area on the side of the face.  The area on the chest is sore to touch with increased redness.  There is one area of increased redness also on the lower extremity.  No drainage, no red streaks appreciated.  Psychiatric: She has a normal mood and affect. Her speech is normal.  Nursing note and vitals reviewed.    ED Treatments / Results  Labs (all labs ordered are listed, but only abnormal results are displayed) Labs Reviewed - No data to display  EKG None  Radiology No results found.  Procedures Procedures (including critical care time)  Medications Ordered in ED Medications  hydrOXYzine (ATARAX/VISTARIL) tablet 25 mg (25 mg Oral Given 11/23/18 0011)  sulfamethoxazole-trimethoprim (BACTRIM DS,SEPTRA DS) 800-160 MG per tablet 1 tablet (1 tablet Oral Given 11/23/18 0011)  albuterol (PROVENTIL HFA;VENTOLIN HFA) 108 (90 Base) MCG/ACT inhaler 2 puff (2 puffs Inhalation Given 11/23/18 0012)     Initial Impression / Assessment and Plan / ED Course  I have reviewed the triage vital signs and the nursing notes.  Pertinent labs & imaging results that were available during my care of the  patient were reviewed by me and considered in my medical decision making (see chart for details).      Final Clinical Impressions(s) / ED Diagnoses MDM  Vital signs within normal limits.  Pulse oximetry is 98% on room air.  Within normal limits by my interpretation.  The patient has an exacerbation of her chronic rash.  I am concerned for possible secondary infection in certain areas.  Patient will be treated with an anti-medic medication to help with burning and itching.  Bactroban cream, as well as prescription for Bactrim.  I have given the patient resources concerning dermatology evaluation.  Questions were answered.  Feel that it is safe for the patient be discharged home.   Final diagnoses:  Skin infection  Eczema, unspecified type  Moderate asthma with exacerbation, unspecified whether persistent    ED Discharge Orders  Ordered    hydrOXYzine (VISTARIL) 25 MG capsule  3 times daily PRN     11/23/18 0018    sulfamethoxazole-trimethoprim (BACTRIM DS,SEPTRA DS) 800-160 MG tablet  2 times daily     11/23/18 0018           Ivery QualeBryant, Sherrin Stahle, PA-C 11/24/18 1635    Pollina, Canary Brimhristopher J, MD 11/24/18 2306

## 2019-01-04 ENCOUNTER — Emergency Department (HOSPITAL_COMMUNITY)
Admission: EM | Admit: 2019-01-04 | Discharge: 2019-01-04 | Disposition: A | Discharge status: Home / Self Care | Payer: Self-pay | Attending: Emergency Medicine | Admitting: Emergency Medicine

## 2019-01-04 ENCOUNTER — Encounter (HOSPITAL_COMMUNITY): Payer: Self-pay

## 2019-01-04 ENCOUNTER — Emergency Department (HOSPITAL_COMMUNITY): Payer: Self-pay

## 2019-01-04 ENCOUNTER — Other Ambulatory Visit: Payer: Self-pay

## 2019-01-04 DIAGNOSIS — W01198A Fall on same level from slipping, tripping and stumbling with subsequent striking against other object, initial encounter: Secondary | ICD-10-CM | POA: Insufficient documentation

## 2019-01-04 DIAGNOSIS — Y998 Other external cause status: Secondary | ICD-10-CM | POA: Insufficient documentation

## 2019-01-04 DIAGNOSIS — Y9289 Other specified places as the place of occurrence of the external cause: Secondary | ICD-10-CM | POA: Insufficient documentation

## 2019-01-04 DIAGNOSIS — J4521 Mild intermittent asthma with (acute) exacerbation: Secondary | ICD-10-CM | POA: Insufficient documentation

## 2019-01-04 DIAGNOSIS — Y9389 Activity, other specified: Secondary | ICD-10-CM | POA: Insufficient documentation

## 2019-01-04 DIAGNOSIS — R102 Pelvic and perineal pain: Secondary | ICD-10-CM | POA: Insufficient documentation

## 2019-01-04 DIAGNOSIS — S301XXA Contusion of abdominal wall, initial encounter: Secondary | ICD-10-CM | POA: Insufficient documentation

## 2019-01-04 LAB — CBC WITH DIFFERENTIAL/PLATELET
Abs Immature Granulocytes: 0.02 10*3/uL (ref 0.00–0.07)
Basophils Absolute: 0.1 10*3/uL (ref 0.0–0.1)
Basophils Relative: 1 %
EOS PCT: 12 %
Eosinophils Absolute: 1.4 10*3/uL — ABNORMAL HIGH (ref 0.0–0.5)
HEMATOCRIT: 44.2 % (ref 36.0–46.0)
HEMOGLOBIN: 14.4 g/dL (ref 12.0–15.0)
Immature Granulocytes: 0 %
LYMPHS PCT: 21 %
Lymphs Abs: 2.4 10*3/uL (ref 0.7–4.0)
MCH: 27.2 pg (ref 26.0–34.0)
MCHC: 32.6 g/dL (ref 30.0–36.0)
MCV: 83.6 fL (ref 80.0–100.0)
MONO ABS: 0.7 10*3/uL (ref 0.1–1.0)
MONOS PCT: 6 %
Neutro Abs: 6.7 10*3/uL (ref 1.7–7.7)
Neutrophils Relative %: 60 %
Platelets: 215 10*3/uL (ref 150–400)
RBC: 5.29 MIL/uL — AB (ref 3.87–5.11)
RDW: 12.6 % (ref 11.5–15.5)
WBC: 11.3 10*3/uL — ABNORMAL HIGH (ref 4.0–10.5)
nRBC: 0 % (ref 0.0–0.2)

## 2019-01-04 LAB — COMPREHENSIVE METABOLIC PANEL
ALK PHOS: 94 U/L (ref 38–126)
ALT: 24 U/L (ref 0–44)
AST: 21 U/L (ref 15–41)
Albumin: 3.9 g/dL (ref 3.5–5.0)
Anion gap: 6 (ref 5–15)
BUN: 15 mg/dL (ref 6–20)
CALCIUM: 8.6 mg/dL — AB (ref 8.9–10.3)
CO2: 23 mmol/L (ref 22–32)
CREATININE: 0.67 mg/dL (ref 0.44–1.00)
Chloride: 110 mmol/L (ref 98–111)
GFR calc Af Amer: >60 mL/min (ref >60)
Glucose, Bld: 105 mg/dL — ABNORMAL HIGH (ref 70–99)
Potassium: 3.8 mmol/L (ref 3.5–5.1)
SODIUM: 139 mmol/L (ref 135–145)
Total Bilirubin: 0.4 mg/dL (ref 0.3–1.2)
Total Protein: 7.1 g/dL (ref 6.5–8.1)

## 2019-01-04 LAB — LIPASE, BLOOD: Lipase: 51 U/L (ref 11–51)

## 2019-01-04 LAB — I-STAT BETA HCG BLOOD, ED (MC, WL, AP ONLY)

## 2019-01-04 MED ORDER — BENZONATATE 200 MG PO CAPS: 200.0000 mg | ORAL_CAPSULE | Freq: Three times a day (TID) | ORAL | 0 refills | Status: DC | PRN

## 2019-01-04 MED ORDER — IPRATROPIUM-ALBUTEROL 0.5-2.5 (3) MG/3ML IN SOLN
3.0000 mL | Freq: Once | RESPIRATORY_TRACT | Status: AC
  Administered 2019-01-04: 3 mL via RESPIRATORY_TRACT
  Filled 2019-01-04: qty 3

## 2019-01-04 MED ORDER — IOPAMIDOL (ISOVUE-300) INJECTION 61%
100.0000 mL | Freq: Once | INTRAVENOUS | Status: AC | PRN
  Administered 2019-01-04: 100 mL via INTRAVENOUS

## 2019-01-04 MED ORDER — PREDNISONE 20 MG PO TABS: 40.0000 mg | ORAL_TABLET | Freq: Every day | ORAL | 0 refills | Status: DC

## 2019-01-04 MED ORDER — FENTANYL CITRATE (PF) 100 MCG/2ML IJ SOLN
50.0000 ug | Freq: Once | INTRAMUSCULAR | Status: AC
  Administered 2019-01-04: 50 ug via INTRAVENOUS
  Filled 2019-01-04: qty 2

## 2019-01-04 MED ORDER — ALBUTEROL SULFATE HFA 108 (90 BASE) MCG/ACT IN AERS
2.0000 | INHALATION_SPRAY | Freq: Once | RESPIRATORY_TRACT | Status: DC
  Filled 2019-01-04: qty 6.7

## 2019-01-04 MED ORDER — ONDANSETRON HCL 4 MG/2ML IJ SOLN
4.0000 mg | Freq: Once | INTRAMUSCULAR | Status: AC
  Administered 2019-01-04: 4 mg via INTRAVENOUS
  Filled 2019-01-04: qty 2

## 2019-01-04 MED ORDER — ALBUTEROL SULFATE (2.5 MG/3ML) 0.083% IN NEBU
2.5000 mg | INHALATION_SOLUTION | Freq: Once | RESPIRATORY_TRACT | Status: AC
  Administered 2019-01-04: 2.5 mg via RESPIRATORY_TRACT
  Filled 2019-01-04: qty 3

## 2019-01-04 NOTE — Discharge Instructions (Addendum)
2 puffs of your albuterol inhaler 4 times a day as needed.  Drink plenty of fluids.  You may take Tylenol if needed for body aches or fever.  Start your prednisone prescription today.  You may apply warm heat on and off to your lower abdomen.  follow-up with your primary doctor or return to the ER for any worsening symptoms.

## 2019-01-04 NOTE — ED Notes (Signed)
Pt states her period has started 2 days earlier than usual, cramping more than usual and bleeding heavier than usual.  Pt denies any blood clots.  Pt with recent fall hitting her lower abdomen on a sink with fall.

## 2019-01-04 NOTE — ED Provider Notes (Signed)
[facility] EMERGENCY DEPARTMENT Provider Note   CSN: [id number] Arrival date & time: 01/04/19  1110     History   Chief Complaint Chief Complaint  Patient presents with  . Pelvic Pain    HPI Amy [name] is a 23 y.o. female.  HPI   Dayton ScrapeSarah E [name] is a 23 y.o. female who presents to the Emergency Department complaining of lower abdominal pain after a mechanical injury in which she tripped and fell up against the edge of a sink striking her lower abdomen.  She also states that she began her menstrual cycle 2 days earlier than normal.  She describes sharp pains across her lower abdomen.  She describes the fall as minimal because she braced herself with her hands.  She denies bruising of her abdomen.  She states that she feels her abdomen is swollen.  No vomiting or diarrhea or dysuria.  Amount of bleeding is similar to previous and without clotting.  No dizziness, weakness, or syncope. Pt also reports hx of asthma and ran out of her inhaler recently.  Complains of increased wheezing and occasional cough.  Non-productive.  Denies chest pain and shortness of breath   Past Medical History:  Diagnosis Date  . Asthma   . Seasonal allergies     There are no active problems to display for this patient.   Past Surgical History:  Procedure Laterality Date  . TONSILLECTOMY    . WISDOM TOOTH EXTRACTION       OB History   No obstetric history on file.      Home Medications    Prior to Admission medications   Medication Sig Start Date End Date Taking? Authorizing Provider  albuterol (PROVENTIL HFA;VENTOLIN HFA) 108 (90 Base) MCG/ACT inhaler Inhale 1-2 puffs into the lungs every 6 (six) hours as needed for wheezing or shortness of breath.    [provider]  benzonatate (TESSALON) 200 MG capsule Take 1 capsule (200 mg total) by mouth 3 (three) times daily as needed for cough. Swallow whole, do not chew 01/04/19   Aijalon Demuro, PA-C  diclofenac (VOLTAREN) 50 MG  EC tablet Take 1 tablet (50 mg total) by mouth 2 (two) times daily. 03/02/18   Elson AreasSofia, Leslie K, PA-C  hydrOXYzine (VISTARIL) 25 MG capsule Take 1 capsule (25 mg total) by mouth 3 (three) times daily as needed. 11/23/18   Ivery QualeBryant, Hobson, PA-C  predniSONE (DELTASONE) 20 MG tablet Take 2 tablets (40 mg total) by mouth daily. 01/04/19   Vanice Rappa, Babette Relicammy, PA-C    Family History No family history on file.  Social History Social History   Tobacco Use  . Smoking status: Never Smoker  . Smokeless tobacco: Never Used  Substance Use Topics  . Alcohol use: Yes    Frequency: Never    Comment: occ  . Drug use: No     Allergies   Cough syrup [guaifenesin] and Other   Review of Systems Review of Systems  Constitutional: Negative for appetite change, chills and fever.  HENT: Negative for congestion.   Respiratory: Positive for wheezing. Negative for cough and shortness of breath.   Cardiovascular: Negative for chest pain.  Gastrointestinal: Positive for abdominal distention and abdominal pain. Negative for blood in stool, diarrhea, nausea and vomiting.  Genitourinary: Positive for vaginal bleeding. Negative for decreased urine volume, difficulty urinating, dysuria, flank pain, frequency and menstrual problem.  Musculoskeletal: Negative for arthralgias, back pain and neck pain.  Skin: Negative for color change, rash and wound.  Neurological:  Negative for dizziness, weakness and numbness.  Hematological: Negative for adenopathy.     Physical Exam Updated Vital Signs BP 122/73 (BP Location: Right Arm)   Pulse 90   Temp (!) 97.5 F (36.4 C) (Oral)   Resp 18   Ht 5\' 4"  (1.626 m)   LMP 01/03/2019 Comment: neg preg  SpO2 95%   BMI 26.79 kg/m   Physical Exam Vitals signs and nursing note reviewed.  Constitutional:      General: She is not in acute distress.    Appearance: Normal appearance. She is not toxic-appearing.  HENT:     Head: Atraumatic.     Mouth/Throat:     Mouth: Mucous  membranes are moist.     Pharynx: Oropharynx is clear. No oropharyngeal exudate or posterior oropharyngeal erythema.  Neck:     Musculoskeletal: Normal range of motion. No muscular tenderness.  Cardiovascular:     Rate and Rhythm: Normal rate and regular rhythm.     Pulses: Normal pulses.  Pulmonary:     Effort: Pulmonary effort is normal. No respiratory distress.     Breath sounds: Wheezing present. No rhonchi or rales.     Comments: Expiratory wheezing throughout all fields.  No respiratory distress.  No rales.   Abdominal:     General: There is no distension.     Palpations: Abdomen is soft. There is no mass.     Tenderness: There is abdominal tenderness in the suprapubic area. There is no guarding.     Comments: Diffuse tenderness to palpation of the lower abdomen.  No guarding or rebound tenderness.  No ecchymosis.  Abdomen does not appear distended  Musculoskeletal: Normal range of motion.  Skin:    General: Skin is warm.     Capillary Refill: Capillary refill takes less than 2 seconds.     Findings: No rash.  Neurological:     General: No focal deficit present.     Mental Status: She is alert.     Sensory: No sensory deficit.     Motor: No weakness.      ED Treatments / Results  Labs (all labs ordered are listed, but only abnormal results are displayed) Labs Reviewed  COMPREHENSIVE METABOLIC PANEL - Abnormal; Notable for the following components:      Result Value   Glucose, Bld 105 (*)    Calcium 8.6 (*)    All other components within normal limits  CBC WITH DIFFERENTIAL/PLATELET - Abnormal; Notable for the following components:   WBC 11.3 (*)    RBC 5.29 (*)    Eosinophils Absolute 1.4 (*)    All other components within normal limits  LIPASE, BLOOD  I-STAT BETA HCG BLOOD, ED ([facility], [facility], [facility] ONLY)    EKG None  Radiology Ct Abdomen Pelvis W Contrast  Result Date: 01/04/2019 CLINICAL DATA:  Lower abdominal pain and vaginal bleeding. Fever. Recent fall with  blunt abdominal trauma. EXAM: CT ABDOMEN AND PELVIS WITH CONTRAST TECHNIQUE: Multidetector CT imaging of the abdomen and pelvis was performed using the standard protocol following bolus administration of intravenous contrast. CONTRAST:  100mL ISOVUE-300 IOPAMIDOL (ISOVUE-300) INJECTION 61% COMPARISON:  None. FINDINGS: Lower Chest: No acute findings. Hepatobiliary: No hepatic masses identified. Multiple small cholesterol gallstones are seen, however there is no evidence of cholecystitis or biliary ductal dilatation. No evidence of perihepatic fluid. Pancreas: No mass or inflammatory changes. No evidence of peripancreatic fluid. Spleen: Within normal limits in size and appearance. No evidence of perisplenic fluid. Adrenals/Urinary Tract: No  masses identified. No evidence of perinephric fluid. No evidence of ureteral calculi or hydronephrosis. Unremarkable unopacified urinary bladder. Stomach/Bowel: No evidence of obstruction, inflammatory process or abnormal fluid collections. Normal appendix visualized. Vascular/Lymphatic: No pathologically enlarged lymph nodes. No abdominal aortic aneurysm. Reproductive:  No mass or other significant abnormality. Other:  None. Musculoskeletal: No suspicious bone lesions identified. No fractures identified. IMPRESSION: 1. No acute findings. No evidence of visceral injury or hemoperitoneum. 2. Cholelithiasis. No radiographic evidence of cholecystitis. Electronically Signed   By: Myles Rosenthal M.D.   On: 01/04/2019 15:30    Procedures Procedures (including critical care time)  Medications Ordered in ED Medications  albuterol (PROVENTIL HFA;VENTOLIN HFA) 108 (90 Base) MCG/ACT inhaler 2 puff (has no administration in time range)  iopamidol (ISOVUE-300) 61 % injection 100 mL (100 mLs Intravenous Contrast Given 01/04/19 1433)  ipratropium-albuterol (DUONEB) 0.5-2.5 (3) MG/3ML nebulizer solution 3 mL (3 mLs Nebulization Given 01/04/19 1534)  albuterol (PROVENTIL) (2.5 MG/3ML) 0.083%  nebulizer solution 2.5 mg (2.5 mg Nebulization Given 01/04/19 1533)  fentaNYL (SUBLIMAZE) injection 50 mcg (50 mcg Intravenous Given 01/04/19 1528)  ondansetron (ZOFRAN) injection 4 mg (4 mg Intravenous Given 01/04/19 1528)     Initial Impression / Assessment and Plan / ED Course  I have reviewed the triage vital signs and the nursing notes.  Pertinent labs & imaging results that were available during my care of the patient were reviewed by me and considered in my medical decision making (see chart for details).     On recheck, patient is well appearing.  Non-toxic.  Contusion of lower abdominal wall.  CT negative for acute injury.  incidental finding of cholelithiasis W/o evidence of acute cholecystitis or ductal dilatation.  No tenderness of the upper abdomen. Labs reassuring.   Pt made aware of findings.  Vitals stable.    Lung sounds improved after albuterol neb.  No respiratory distress.  No hypoxia or tachycardia. Mild asthma exacerbation,  Albuterol MDI dispensed for home.  rx for tessalon and prednisone   She appears stable for d/c home, strict return precautions discussed and pt verbalized understanding    Final Clinical Impressions(s) / ED Diagnoses   Final diagnoses:  Contusion of abdominal wall, initial encounter  Mild intermittent asthma with exacerbation    ED Discharge Orders         Ordered    predniSONE (DELTASONE) 20 MG tablet  Daily     01/04/19 1602    benzonatate (TESSALON) 200 MG capsule  3 times daily PRN     01/04/19 1602           Pauline Aus, PA-C 01/07/19 1751    Bethann Berkshire, MD 01/09/19 2327

## 2019-01-04 NOTE — ED Triage Notes (Signed)
Pt reports that she fell into the sink and hit her pelvic area 2 days ago and pain has increased since last night. Report she started her period 2 days sooner that normal

## 2019-02-25 ENCOUNTER — Encounter: Payer: Self-pay | Admitting: Physician Assistant

## 2019-02-25 ENCOUNTER — Ambulatory Visit: Payer: Self-pay | Admitting: Physician Assistant

## 2019-02-25 VITALS — BP 120/74 | HR 83 | Temp 97.9°F | Ht 63.5 in | Wt 176.8 lb

## 2019-02-25 DIAGNOSIS — E669 Obesity, unspecified: Secondary | ICD-10-CM

## 2019-02-25 DIAGNOSIS — R062 Wheezing: Secondary | ICD-10-CM

## 2019-02-25 DIAGNOSIS — Z7689 Persons encountering health services in other specified circumstances: Secondary | ICD-10-CM

## 2019-02-25 DIAGNOSIS — J45909 Unspecified asthma, uncomplicated: Secondary | ICD-10-CM

## 2019-02-25 DIAGNOSIS — L309 Dermatitis, unspecified: Secondary | ICD-10-CM | POA: Insufficient documentation

## 2019-02-25 MED ORDER — MONTELUKAST SODIUM 10 MG PO TABS: 10.0000 mg | ORAL_TABLET | Freq: Every day | ORAL | 0 refills | Status: DC

## 2019-02-25 MED ORDER — IPRATROPIUM-ALBUTEROL 0.5-2.5 (3) MG/3ML IN SOLN
3.0000 mL | Freq: Once | RESPIRATORY_TRACT | Status: AC
  Administered 2019-02-25: 3 mL via RESPIRATORY_TRACT

## 2019-02-25 MED ORDER — MOMETASONE FURO-FORMOTEROL FUM 100-5 MCG/ACT IN AERO: 2.0000 | INHALATION_SPRAY | Freq: Two times a day (BID) | RESPIRATORY_TRACT | 1 refills | Status: DC

## 2019-02-25 MED ORDER — MELOXICAM 15 MG PO TABS: 15.0000 mg | ORAL_TABLET | Freq: Every day | ORAL | 0 refills | Status: DC | PRN

## 2019-02-25 MED ORDER — ALBUTEROL SULFATE HFA 108 (90 BASE) MCG/ACT IN AERS: 2.0000 | INHALATION_SPRAY | Freq: Four times a day (QID) | RESPIRATORY_TRACT | 1 refills | Status: DC | PRN

## 2019-02-25 MED ORDER — ALBUTEROL SULFATE HFA 108 (90 BASE) MCG/ACT IN AERS: 2.0000 | INHALATION_SPRAY | Freq: Four times a day (QID) | RESPIRATORY_TRACT | 0 refills | Status: DC | PRN

## 2019-02-25 NOTE — Progress Notes (Signed)
BP 120/74 (BP Location: Left Arm, [name] Position: Sitting, Cuff Size: Normal)   Pulse 83   Temp 97.9 F (36.6 C)   Ht 5' 3.5" (1.613 m)   Wt 176 lb 12 oz (80.2 kg)   SpO2 97%   BMI 30.82 kg/m    Subjective:    [name] ID: Amy [name], female    DOB: [birth year], 23 y.o.   MRN: [id number]  HPI: Amy [name] is a 23 y.o. female presenting on 02/25/2019 for New [name] (Initial Visit)   HPI  Pt got a job recently at [employer] but [employer] hasn't opened yet.  It is scheduled to open end of March.   Pt had normal PAP 2 years ago in [place].   Pt says she has been on different inhalers for her asthma in Amy past- Amy blue (ventolin), yellow (proventil) , red one (proair).  She says she has never been on maintenance medication.   She does not have a nebulizer machine  Pt says she has been to 4 dermatologists for her eczema but says no one can figure out what to do for it.   Relevant past medical, surgical, family and social history reviewed and updated as indicated. Interim medical history since our last visit reviewed. Allergies and medications reviewed and updated.   No current outpatient medications on file.    Review of Systems  Constitutional: Negative for appetite change, chills, diaphoresis, fatigue, fever and unexpected weight change.  HENT: Negative for congestion, dental problem, drooling, ear pain, facial swelling, hearing loss, mouth sores, sneezing, sore throat, trouble swallowing and voice change.   Eyes: Negative for pain, discharge, redness, itching and visual disturbance.  Respiratory: Negative for cough, choking, shortness of breath and wheezing.   Cardiovascular: Negative for chest pain, palpitations and leg swelling.  Gastrointestinal: Negative for abdominal pain, blood in stool, constipation, diarrhea and vomiting.  Endocrine: Negative for cold intolerance, heat intolerance and polydipsia.  Genitourinary: Negative for decreased urine  volume, dysuria and hematuria.  Musculoskeletal: Negative for arthralgias, back pain and gait problem.  Skin: Negative for rash.  Allergic/Immunologic: Negative for environmental allergies.  Neurological: Negative for seizures, syncope, light-headedness and headaches.  Hematological: Negative for adenopathy.  Psychiatric/Behavioral: Negative for agitation, dysphoric mood and suicidal ideas. Amy [name] is not nervous/anxious.     Per HPI unless specifically indicated above     Objective:    BP 120/74 (BP Location: Left Arm, [name] Position: Sitting, Cuff Size: Normal)   Pulse 83   Temp 97.9 F (36.6 C)   Ht 5' 3.5" (1.613 m)   Wt 176 lb 12 oz (80.2 kg)   SpO2 97%   BMI 30.82 kg/m   Wt Readings from Last 3 Encounters:  02/25/19 176 lb 12 oz (80.2 kg)  11/22/18 156 lb 1.4 oz (70.8 kg)  11/04/18 156 lb (70.8 kg)    Physical Exam Vitals signs reviewed.  Constitutional:      Appearance: She is well-developed.  HENT:     Head: Normocephalic and atraumatic.     Mouth/Throat:     Pharynx: No oropharyngeal exudate.  Eyes:     Conjunctiva/sclera: Conjunctivae normal.     Pupils: Pupils are equal, round, and reactive to light.  Neck:     Musculoskeletal: Neck supple.     Thyroid: No thyromegaly.  Cardiovascular:     Rate and Rhythm: Normal rate and regular rhythm.  Pulmonary:     Effort: Pulmonary effort is normal. No respiratory distress.  Breath sounds: No stridor. Wheezing present. No rhonchi or rales.  Abdominal:     General: Bowel sounds are normal.     Palpations: Abdomen is soft. There is no mass.     Tenderness: There is no abdominal tenderness.  Musculoskeletal:     Right lower leg: No edema.     Left lower leg: No edema.  Lymphadenopathy:     Cervical: No cervical adenopathy.  Skin:    General: Skin is warm and dry.     Findings: Rash present.     Comments: eczma B wrists and anterior lower legs- dry cracking skin.  No secondary infection seen.    Neurological:     Mental Status: She is alert and oriented to person, place, and time.     Gait: Gait normal.  Psychiatric:        Behavior: Behavior normal.          Assessment & Plan:    Encounter Diagnoses  Name Primary?  . Encounter to establish care Yes  . Asthma, unspecified asthma severity, unspecified whether complicated, unspecified whether persistent   . Eczema, unspecified type   . Wheezing   . Obesity, unspecified classification, unspecified obesity type, unspecified whether serious comorbidity present   \ -pt has been signed up for medication assistance -rx for Inhalers sent to [facility].  She is given rx and coupon in case she needs to get one locally before her mail order meds arrive -pt is Counseled on eczema but she is very resistant to any recommendation (encouraged her to shower no more than once/day and she says she HAS to shower 2 or 3 times daily- explained to pt how this causes dryness and will exacerbate her eczema but she was unreceptive) Record request for PAP sent to previous provider in [place] -pt to follow up 3 months.  RTO sooner prn

## 2019-02-26 ENCOUNTER — Other Ambulatory Visit: Payer: Self-pay | Admitting: Physician Assistant

## 2019-05-16 ENCOUNTER — Other Ambulatory Visit: Payer: Self-pay

## 2019-05-16 ENCOUNTER — Emergency Department (HOSPITAL_COMMUNITY): Payer: Self-pay

## 2019-05-16 ENCOUNTER — Encounter (HOSPITAL_COMMUNITY): Payer: Self-pay | Admitting: Emergency Medicine

## 2019-05-16 ENCOUNTER — Emergency Department (HOSPITAL_COMMUNITY)
Admission: EM | Admit: 2019-05-16 | Discharge: 2019-05-16 | Disposition: A | Discharge status: Home / Self Care | Payer: Self-pay | Attending: Emergency Medicine | Admitting: Emergency Medicine

## 2019-05-16 DIAGNOSIS — Y9301 Activity, walking, marching and hiking: Secondary | ICD-10-CM | POA: Insufficient documentation

## 2019-05-16 DIAGNOSIS — S93601A Unspecified sprain of right foot, initial encounter: Secondary | ICD-10-CM | POA: Insufficient documentation

## 2019-05-16 DIAGNOSIS — J45909 Unspecified asthma, uncomplicated: Secondary | ICD-10-CM | POA: Insufficient documentation

## 2019-05-16 DIAGNOSIS — X509XXA Other and unspecified overexertion or strenuous movements or postures, initial encounter: Secondary | ICD-10-CM | POA: Insufficient documentation

## 2019-05-16 DIAGNOSIS — S93401A Sprain of unspecified ligament of right ankle, initial encounter: Secondary | ICD-10-CM | POA: Insufficient documentation

## 2019-05-16 DIAGNOSIS — Y998 Other external cause status: Secondary | ICD-10-CM | POA: Insufficient documentation

## 2019-05-16 DIAGNOSIS — R2241 Localized swelling, mass and lump, right lower limb: Secondary | ICD-10-CM | POA: Insufficient documentation

## 2019-05-16 DIAGNOSIS — Z79899 Other long term (current) drug therapy: Secondary | ICD-10-CM | POA: Insufficient documentation

## 2019-05-16 DIAGNOSIS — Y92012 Bathroom of single-family (private) house as the place of occurrence of the external cause: Secondary | ICD-10-CM | POA: Insufficient documentation

## 2019-05-16 NOTE — ED Provider Notes (Signed)
[facility] EMERGENCY DEPARTMENT Provider Note   CSN: [id number] Arrival date & time: 05/16/19  1430    History   Chief Complaint Chief Complaint  Patient presents with  . Foot Pain    HPI Amy [name] is a 23 y.o. female.  She is complaining of an injury to her right foot when she twisted it coming into the bathroom.  She denies any head or neck injury and there was no loss of consciousness.  She has been unable to bear weight on it since then.  She rates the pain is severe and worse with any movement or twisting.  Is primarily located at her lateral malleolus although she does have pain through the midfoot.  No numbness or tingling.  She has tried nothing for it.     The history is provided by the patient.  Ankle Pain  Location:  Ankle and foot Time since incident:  1 hour Injury: yes   Mechanism of injury: fall   Fall:    Fall occurred:  Standing Ankle location:  R ankle Foot location:  R foot Pain details:    Quality:  Throbbing   Radiates to:  Does not radiate   Severity:  Severe   Onset quality:  Sudden   Duration:  1 hour Chronicity:  New Dislocation: no   Foreign body present:  No foreign bodies Relieved by:  None tried Worsened by:  Bearing weight Ineffective treatments:  None tried Associated symptoms: swelling   Associated symptoms: no back pain, no fever, no neck pain, no numbness and no tingling     Past Medical History:  Diagnosis Date  . Asthma   . Migraines   . Seasonal allergies     Patient Active Problem List   Diagnosis Date Noted  . Asthma 02/25/2019  . Eczema 02/25/2019    Past Surgical History:  Procedure Laterality Date  . TONSILLECTOMY    . WISDOM TOOTH EXTRACTION       OB History   No obstetric history on file.      Home Medications    Prior to Admission medications   Medication Sig Start Date End Date Taking? Authorizing Provider  albuterol (PROVENTIL HFA;VENTOLIN HFA) 108 (90 Base) MCG/ACT inhaler Inhale 2  puffs into the lungs every 6 (six) hours as needed for wheezing or shortness of breath. 02/25/19   Jacquelin HawkingMcElroy, Shannon, PA-C  albuterol (PROVENTIL HFA;VENTOLIN HFA) 108 (90 Base) MCG/ACT inhaler Inhale 2 puffs into the lungs every 6 (six) hours as needed for wheezing or shortness of breath. 02/25/19   Jacquelin HawkingMcElroy, Shannon, PA-C  meloxicam (MOBIC) 15 MG tablet Take 1 tablet (15 mg total) by mouth daily as needed for pain. 02/25/19   Jacquelin HawkingMcElroy, Shannon, PA-C  mometasone-formoterol (DULERA) 100-5 MCG/ACT AERO Inhale 2 puffs into the lungs 2 (two) times daily. 02/25/19   Jacquelin HawkingMcElroy, Shannon, PA-C  montelukast (SINGULAIR) 10 MG tablet Take 1 tablet (10 mg total) by mouth at bedtime. 02/25/19   Jacquelin HawkingMcElroy, Shannon, PA-C    Family History Family History  Problem Relation Age of Onset  . Diabetes Mother   . Hypertension Mother   . Diabetes Father   . Cancer Father        basal cell cancer  . Cancer Maternal Uncle        brain    Social History Social History   Tobacco Use  . Smoking status: Never Smoker  . Smokeless tobacco: Never Used  Substance Use Topics  . Alcohol use: Yes  Frequency: Never    Comment: occ  . Drug use: No     Allergies   Cough syrup [guaifenesin] and Other   Review of Systems Review of Systems  Constitutional: Negative for fever.  Musculoskeletal: Positive for gait problem. Negative for back pain and neck pain.  Skin: Negative for wound.  Neurological: Negative for numbness.     Physical Exam Updated Vital Signs BP 131/64 (BP Location: Right Arm)   Pulse 83   Temp (!) 97.5 F (36.4 C) (Oral)   Resp 18   Ht  (1.626 m)   Wt 74.4 kg   SpO2 99%   BMI 28.15 kg/m   Physical Exam Constitutional:      Appearance: She is well-developed.  HENT:     Head: Normocephalic and atraumatic.  Eyes:     Conjunctiva/sclera: Conjunctivae normal.  Neck:     Musculoskeletal: Neck supple.  Musculoskeletal:     Comments: Patient is nontender right hip and knee.  She is tender  throughout the ankle and foot primarily over the lateral malleolus and anterior talofibular calcaneofibular ligaments.  There is diffuse foot tenderness.  She is able to move her digits and has intact sensation and cap refill.  There is minimal swelling and no overlying erythema or open wounds.  Skin:    General: Skin is warm and dry.     Capillary Refill: Capillary refill takes less than 2 seconds.  Neurological:     General: No focal deficit present.     Mental Status: She is alert and oriented to person, place, and time.     GCS: GCS eye subscore is 4. GCS verbal subscore is 5. GCS motor subscore is 6.     Sensory: No sensory deficit.      ED Treatments / Results  Labs (all labs ordered are listed, but only abnormal results are displayed) Labs Reviewed - No data to display  EKG None  Radiology Dg Ankle Complete Right  Result Date: 05/16/2019 CLINICAL DATA:  Pain following fall EXAM: RIGHT ANKLE - COMPLETE 3+ VIEW COMPARISON:  None. FINDINGS: Frontal, oblique, and lateral views obtained. There is no fracture or joint effusion. There is no joint space narrowing or erosion. The ankle mortise appears intact. IMPRESSION: No fracture or evident arthropathy.  Ankle mortise appears intact. Electronically Signed   By: Bretta Bang III M.D.   On: 05/16/2019 15:08   Dg Foot Complete Right  Result Date: 05/16/2019 CLINICAL DATA:  Pain following fall EXAM: RIGHT FOOT COMPLETE - 3+ VIEW COMPARISON:  None. FINDINGS: Frontal, oblique, and lateral views obtained. No evident fracture or dislocation. Joint spaces appear normal. No erosive change. IMPRESSION: No fracture or dislocation.  No evident arthropathy. Electronically Signed   By: Bretta Bang III M.D.   On: 05/16/2019 15:09    Procedures Procedures (including critical care time)  Medications Ordered in ED Medications - No data to display   Initial Impression / Assessment and Plan / ED Course  I have reviewed the triage vital  signs and the nursing notes.  Pertinent labs & imaging results that were available during my care of the patient were reviewed by me and considered in my medical decision making (see chart for details).  Clinical Course as of May 15 1624  Sat May 16, 2019  1447 Differential diagnosis includes sprain, fracture, dislocation, subluxation.   [MB]  1511 Patient's x-rays do not show an obvious fracture although I question may be a navicular on the foot.  Awaiting radiology readings.   [MB]  1517 X-rays read by radiology as negative for fracture or dislocation.  I reviewed this with the patient.  We will get her an Aircast and she already has her own crutches.  She understands to follow-up with her doctor and use ibuprofen and ice.   [MB]    Clinical Course User Index [MB] Terrilee Files, MD      Final Clinical Impressions(s) / ED Diagnoses   Final diagnoses:  Sprain of right ankle, unspecified ligament, initial encounter  Sprain of right foot, initial encounter    ED Discharge Orders    None       Terrilee Files, MD 05/16/19 1626

## 2019-05-16 NOTE — Discharge Instructions (Addendum)
You were seen in the emergency department for an injury to your right foot and ankle.  You had x-rays that did not show an obvious fracture.  You should use the brace and crutches and ice the affected area for the next 24 hours.  Ibuprofen or other anti-inflammatory medication.  Nonweightbearing for 2 days and may slowly start adding weight as tolerated.  Follow-up with your doctor and return if any worsening symptoms.

## 2019-05-16 NOTE — ED Triage Notes (Signed)
Pt fell today going into the bathroom. C/o of right foot pain.

## 2019-05-28 ENCOUNTER — Ambulatory Visit: Payer: Self-pay | Admitting: Physician Assistant

## 2019-05-28 ENCOUNTER — Encounter: Payer: Self-pay | Admitting: Physician Assistant

## 2019-05-28 DIAGNOSIS — J45909 Unspecified asthma, uncomplicated: Secondary | ICD-10-CM

## 2019-05-28 MED ORDER — MOMETASONE FURO-FORMOTEROL FUM 100-5 MCG/ACT IN AERO: 2.0000 | INHALATION_SPRAY | Freq: Two times a day (BID) | RESPIRATORY_TRACT | 1 refills | Status: DC

## 2019-05-28 MED ORDER — MONTELUKAST SODIUM 10 MG PO TABS: 10.0000 mg | ORAL_TABLET | Freq: Every day | ORAL | 0 refills | Status: DC

## 2019-05-28 NOTE — Progress Notes (Signed)
   There were no vitals taken for this visit.   Subjective:    Patient ID: Amy [name], female    DOB: October 24, 1996, 23 y.o.   MRN: [id number]  HPI: Amy [name] is a 23 y.o. female presenting on 05/28/2019 for No chief complaint on file.   HPI   This is a telemedicine appointment through [employer] due to coronavirus pandemic  I connected with  Amy [name] on 05/28/19 by a video enabled telemedicine application and verified that I am speaking with the correct person using two identifiers.   I discussed the limitations of evaluation and management by telemedicine. The patient expressed understanding and agreed to proceed.   Pt is at home.  Provider is at office/clinic  Pt says she is doing well Pt says Elwin Sleight is working.  She has not been needing to use her albuterol inhaler.  She only occassionally uses the mobic- just prn HA.     She is wearing a mask when she goes out to store  Discussed with pt that place in [place] were record requst was sent for last PAP states she wasn't a pt  Relevant past medical, surgical, family and social history reviewed and updated as indicated. Interim medical history since our last visit reviewed. Allergies and medications reviewed and updated.   Current Outpatient Medications:  .  albuterol (PROVENTIL HFA;VENTOLIN HFA) 108 (90 Base) MCG/ACT inhaler, Inhale 2 puffs into the lungs every 6 (six) hours as needed for wheezing or shortness of breath., Disp: 1 Inhaler, Rfl: 0 .  meloxicam (MOBIC) 15 MG tablet, Take 1 tablet (15 mg total) by mouth daily as needed for pain., Disp: 30 tablet, Rfl: 0 .  mometasone-formoterol (DULERA) 100-5 MCG/ACT AERO, Inhale 2 puffs into the lungs 2 (two) times daily., Disp: 3 Inhaler, Rfl: 1 .  montelukast (SINGULAIR) 10 MG tablet, Take 1 tablet (10 mg total) by mouth at bedtime., Disp: 90 tablet, Rfl: 0    Review of Systems  Per HPI unless specifically indicated above     Objective:    There were no vitals  taken for this visit.  Wt Readings from Last 3 Encounters:  05/16/19 164 lb (74.4 kg)  02/25/19 176 lb 12 oz (80.2 kg)  11/22/18 156 lb 1.4 oz (70.8 kg)    Physical Exam Constitutional:      General: She is not in acute distress.    Appearance: She is not ill-appearing.  HENT:     Head: Normocephalic and atraumatic.  Pulmonary:     Effort: Pulmonary effort is normal. No respiratory distress.  Neurological:     Mental Status: She is alert and oriented to person, place, and time.  Psychiatric:        Attention and Perception: Attention normal.        Mood and Affect: Mood normal.        Speech: Speech normal.        Behavior: Behavior is cooperative.        Cognition and Memory: Cognition normal.         Assessment & Plan:      Encounter Diagnosis  Name Primary?  [name] Asthma, unspecified asthma severity, unspecified whether complicated, unspecified whether persistent Yes    -Pt will continue with current medications -Pt to get better number for record request for her PAP report.  She will contact us with that when she gets it -pt to follow up 5 months.  Pt to contact office sooner prn

## 2019-09-01 ENCOUNTER — Other Ambulatory Visit: Payer: Self-pay | Admitting: Physician Assistant

## 2019-10-17 ENCOUNTER — Other Ambulatory Visit: Payer: Self-pay

## 2019-10-17 ENCOUNTER — Encounter (HOSPITAL_COMMUNITY): Payer: Self-pay | Admitting: Emergency Medicine

## 2019-10-17 ENCOUNTER — Emergency Department (HOSPITAL_COMMUNITY)
Admission: EM | Admit: 2019-10-17 | Discharge: 2019-10-17 | Disposition: A | Discharge status: Home / Self Care | Payer: Self-pay | Attending: Emergency Medicine | Admitting: Emergency Medicine

## 2019-10-17 DIAGNOSIS — T23222A Burn of second degree of single left finger (nail) except thumb, initial encounter: Secondary | ICD-10-CM | POA: Insufficient documentation

## 2019-10-17 DIAGNOSIS — Y92000 Kitchen of unspecified non-institutional (private) residence as  the place of occurrence of the external cause: Secondary | ICD-10-CM | POA: Insufficient documentation

## 2019-10-17 DIAGNOSIS — J45909 Unspecified asthma, uncomplicated: Secondary | ICD-10-CM | POA: Insufficient documentation

## 2019-10-17 DIAGNOSIS — T23209A Burn of second degree of unspecified hand, unspecified site, initial encounter: Secondary | ICD-10-CM

## 2019-10-17 DIAGNOSIS — X150XXA Contact with hot stove (kitchen), initial encounter: Secondary | ICD-10-CM | POA: Insufficient documentation

## 2019-10-17 DIAGNOSIS — Y999 Unspecified external cause status: Secondary | ICD-10-CM | POA: Insufficient documentation

## 2019-10-17 DIAGNOSIS — Y93G3 Activity, cooking and baking: Secondary | ICD-10-CM | POA: Insufficient documentation

## 2019-10-17 DIAGNOSIS — Z79899 Other long term (current) drug therapy: Secondary | ICD-10-CM | POA: Insufficient documentation

## 2019-10-17 MED ORDER — SILVER SULFADIAZINE 1 % EX CREA: 1.0000 "application " | TOPICAL_CREAM | Freq: Every day | CUTANEOUS | 0 refills | Status: DC

## 2019-10-17 MED ORDER — BACITRACIN-NEOMYCIN-POLYMYXIN 400-5-5000 EX OINT
TOPICAL_OINTMENT | Freq: Once | CUTANEOUS | Status: AC
  Administered 2019-10-17: 11:00:00 via TOPICAL
  Filled 2019-10-17: qty 1

## 2019-10-17 NOTE — Discharge Instructions (Signed)
Keep wound clean and dry.  Apply cream daily and keep it covered.  If you have any signs of infection such as increased redness, increased swelling or pain please return to the emergency department.  You may take over-the-counter Tylenol or Motrin for pain.

## 2019-10-17 NOTE — ED Triage Notes (Signed)
Pt states she burned her left pinkie finger on the stove this morning. Intact blister noted.

## 2019-10-17 NOTE — ED Provider Notes (Signed)
[facility] EMERGENCY DEPARTMENT Provider Note   CSN: [id number] Arrival date & time: 10/17/19  1010     History   Chief Complaint Chief Complaint  Patient presents with  . Burn    HPI Amy [name] is a 23 y.o. female.     Patient is a 23 year old female with past medical history of asthma who presents to the emergency department for burn to the left finger.  Patient reports that this morning she was cooking and accidentally the lateral aspect of her left little finger touched a hot object.  Reports that she has some pain and blistering.  No treatment prior to arrival.  No other injuries     Past Medical History:  Diagnosis Date  . Asthma   . Migraines   . Seasonal allergies     Patient Active Problem List   Diagnosis Date Noted  . Asthma 02/25/2019  . Eczema 02/25/2019    Past Surgical History:  Procedure Laterality Date  . TONSILLECTOMY    . WISDOM TOOTH EXTRACTION       OB History   No obstetric history on file.      Home Medications    Prior to Admission medications   Medication Sig Start Date End Date Taking? Authorizing Provider  meloxicam (MOBIC) 15 MG tablet TAKE 1 Tablet BY MOUTH ONCE DAILY AS NEEDED FOR PAIN 09/01/19   Jacquelin Hawking, PA-C  mometasone-formoterol (DULERA) 100-5 MCG/ACT AERO Inhale 2 puffs into the lungs 2 (two) times daily. 05/28/19   Jacquelin Hawking, PA-C  montelukast (SINGULAIR) 10 MG tablet TAKE 1 Tablet BY MOUTH EVERY NIGHT AT BEDTIME 09/01/19   Jacquelin Hawking, PA-C  PROVENTIL HFA 108 (90 Base) MCG/ACT inhaler INHALE 2 PUFFS BY MOUTH EVERY 6 HOURS AS NEEDED FOR COUGHING, WHEEZING, OR SHORTNESS OF BREATH 09/01/19   Jacquelin Hawking, PA-C  silver sulfADIAZINE (SILVADENE) 1 % cream Apply 1 application topically daily. 10/17/19   Arlyn Dunning, PA-C    Family History Family History  Problem Relation Age of Onset  . Diabetes Mother   . Hypertension Mother   . Diabetes Father   . Cancer Father        basal cell cancer   . Cancer Maternal Uncle        brain    Social History Social History   Tobacco Use  . Smoking status: Never Smoker  . Smokeless tobacco: Never Used  Substance Use Topics  . Alcohol use: Yes    Frequency: Never    Comment: occ  . Drug use: No     Allergies   Cough syrup [guaifenesin] and Other   Review of Systems Review of Systems  Constitutional: Negative for appetite change, chills and fever.  Gastrointestinal: Negative for nausea and vomiting.  Skin: Positive for wound. Negative for rash.  Allergic/Immunologic: Negative for immunocompromised state.  Neurological: Negative for dizziness and light-headedness.  Hematological: Does not bruise/bleed easily.  Psychiatric/Behavioral: Negative for self-injury.     Physical Exam Updated Vital Signs BP 125/66 (BP Location: Right Arm)   Pulse 74   Temp 98 F (36.7 C) (Oral)   Resp 16   Ht 5' (1.524 m)   Wt 74.4 kg   LMP 10/12/2019   SpO2 98%   BMI 32.03 kg/m   Physical Exam Vitals signs and nursing note reviewed.  Constitutional:      Appearance: Normal appearance.  HENT:     Head: Normocephalic.  Eyes:     Conjunctiva/sclera: Conjunctivae normal.  Pulmonary:  Effort: Pulmonary effort is normal.  Skin:    General: Skin is dry.     Capillary Refill: Capillary refill takes less than 2 seconds.     Comments: There is a half a centimeter by 3 mm blister to the distal portion of the left little finger.  No surrounding erythema, no drainage, neurovascularly intact.  Neurological:     Mental Status: She is alert.  Psychiatric:        Mood and Affect: Mood normal.      ED Treatments / Results  Labs (all labs ordered are listed, but only abnormal results are displayed) Labs Reviewed - No data to display  EKG None  Radiology No results found.  Procedures Procedures (including critical care time)  Medications Ordered in ED Medications  neomycin-bacitracin-polymyxin (NEOSPORIN) ointment packet (  Topical Given 10/17/19 1127)     Initial Impression / Assessment and Plan / ED Course  I have reviewed the triage vital signs and the nursing notes.  Pertinent labs & imaging results that were available during my care of the patient were reviewed by me and considered in my medical decision making (see chart for details).  Clinical Course as of Oct 16 1126  Sat Oct 17, 2019  1127 Patient presenting with a superficial partial-thickness burn to the distal lateral pinky.  Does not cross joint lines.  Is not circumferential.  It covers about 1/2 cm x 52mm surface area.  She is up-to-date on her tetanus shot within the last 4 years.  The wound was dressed with bacitracin and covered with a bandage.  I sent a prescription for Silvadene cream to the pharmacy and advised her on wound care and proper follow-up instructions.   [KM]    Clinical Course User Index [KM] Alveria Apley, PA-C       Based on review of vitals, medical screening exam, lab work and/or imaging, there does not appear to be an acute, emergent etiology for the patient's symptoms. Counseled pt on good return precautions and encouraged both PCP and ED follow-up as needed.  Prior to discharge, I also discussed incidental imaging findings with patient in detail and advised appropriate, recommended follow-up in detail.  Clinical Impression: 1. Superficial partial thickness burn of hand     Disposition: Discharge  Prior to providing a prescription for a controlled substance, I independently reviewed the patient's recent prescription history on the [place]. The patient had no recent or regular prescriptions and was deemed appropriate for a brief, less than 3 day prescription of narcotic for acute analgesia.  This note was prepared with assistance of Systems analyst. Occasional wrong-word or sound-a-like substitutions may have occurred due to the inherent limitations of  voice recognition software.   Final Clinical Impressions(s) / ED Diagnoses   Final diagnoses:  Superficial partial thickness burn of hand    ED Discharge Orders         Ordered    silver sulfADIAZINE (SILVADENE) 1 % cream  Daily     10/17/19 1115           Kristine Royal 10/17/19 1128    Daleen Bo, MD 10/18/19 [phone]

## 2019-11-02 ENCOUNTER — Encounter: Payer: Self-pay | Admitting: Physician Assistant

## 2019-11-02 ENCOUNTER — Ambulatory Visit: Payer: Self-pay | Admitting: Physician Assistant

## 2019-11-02 DIAGNOSIS — J45909 Unspecified asthma, uncomplicated: Secondary | ICD-10-CM

## 2019-11-02 NOTE — Progress Notes (Signed)
   LMP 10/12/2019    Subjective:    Patient ID: Amy [name], female    DOB: [birth year], 23 y.o.   MRN: [id number]  HPI: Amy [name] is a 23 y.o. female presenting on 11/02/2019 for No chief complaint on file.   HPI  This is a telemedicine appointment through [employer] due to coronavirus pandemic  I connected with  Amy [name] on 11/920 by a video enabled telemedicine application and verified that I am speaking with the correct person using two identifiers.   I discussed the limitations of evaluation and management by telemedicine. The patient expressed understanding and agreed to proceed.  Pt is at home.  Provider is at office.   Pt says she is doing well and she has no complaints.  She is wearing a mask when she goes out.    Relevant past medical, surgical, family and social history reviewed and updated as indicated. Interim medical history since our last visit reviewed. Allergies and medications reviewed and updated.   Current Outpatient Medications:  .  meloxicam (MOBIC) 15 MG tablet, TAKE 1 Tablet BY MOUTH ONCE DAILY AS NEEDED FOR PAIN, Disp: 30 tablet, Rfl: 0 .  mometasone-formoterol (DULERA) 100-5 MCG/ACT AERO, Inhale 2 puffs into the lungs 2 (two) times daily., Disp: 3 Inhaler, Rfl: 1 .  montelukast (SINGULAIR) 10 MG tablet, TAKE 1 Tablet BY MOUTH EVERY NIGHT AT BEDTIME, Disp: 90 tablet, Rfl: 0 .  PROVENTIL HFA 108 (90 Base) MCG/ACT inhaler, INHALE 2 PUFFS BY MOUTH EVERY 6 HOURS AS NEEDED FOR COUGHING, WHEEZING, OR SHORTNESS OF BREATH (Patient not taking: Reported on 11/02/2019), Disp: 20.1 g, Rfl: 1    Review of Systems  Per HPI unless specifically indicated above     Objective:    LMP 10/12/2019   Wt Readings from Last 3 Encounters:  10/17/19 164 lb 0.4 oz (74.4 kg)  05/16/19 164 lb (74.4 kg)  02/25/19 176 lb 12 oz (80.2 kg)    Physical Exam Constitutional:      General: She is not in acute distress.    Appearance: Normal appearance. She is not  ill-appearing.  HENT:     Head: Normocephalic and atraumatic.  Pulmonary:     Effort: Pulmonary effort is normal. No respiratory distress.  Neurological:     Mental Status: She is alert and oriented to person, place, and time.  Psychiatric:        Attention and Perception: Attention normal.        Speech: Speech normal.        Behavior: Behavior is cooperative.           Assessment & Plan:    Encounter Diagnosis  Name Primary?  [name] Asthma, unspecified asthma severity, unspecified whether complicated, unspecified whether persistent Yes      Pt to continue current medications Pt encouraged to wear a mask when she has to go out Pt to follow up 6 months.  She is to contact office sooner prn

## 2019-11-25 ENCOUNTER — Other Ambulatory Visit: Payer: Self-pay

## 2019-11-25 DIAGNOSIS — Z20822 Contact with and (suspected) exposure to covid-19: Secondary | ICD-10-CM

## 2019-11-27 LAB — NOVEL CORONAVIRUS, NAA: SARS-CoV-2, NAA: NOT DETECTED

## 2019-11-30 ENCOUNTER — Other Ambulatory Visit: Payer: Self-pay | Admitting: Physician Assistant

## 2019-12-20 ENCOUNTER — Other Ambulatory Visit: Payer: Self-pay

## 2019-12-20 ENCOUNTER — Emergency Department (HOSPITAL_COMMUNITY)
Admission: EM | Admit: 2019-12-20 | Discharge: 2019-12-20 | Disposition: A | Discharge status: Home / Self Care | Payer: Self-pay | Attending: Emergency Medicine | Admitting: Emergency Medicine

## 2019-12-20 ENCOUNTER — Encounter (HOSPITAL_COMMUNITY): Payer: Self-pay

## 2019-12-20 ENCOUNTER — Emergency Department (HOSPITAL_COMMUNITY): Payer: Self-pay

## 2019-12-20 DIAGNOSIS — J45909 Unspecified asthma, uncomplicated: Secondary | ICD-10-CM | POA: Insufficient documentation

## 2019-12-20 DIAGNOSIS — Z20828 Contact with and (suspected) exposure to other viral communicable diseases: Secondary | ICD-10-CM | POA: Insufficient documentation

## 2019-12-20 DIAGNOSIS — R0789 Other chest pain: Secondary | ICD-10-CM | POA: Insufficient documentation

## 2019-12-20 DIAGNOSIS — Z79899 Other long term (current) drug therapy: Secondary | ICD-10-CM | POA: Insufficient documentation

## 2019-12-20 MED ORDER — ALBUTEROL SULFATE HFA 108 (90 BASE) MCG/ACT IN AERS
2.0000 | INHALATION_SPRAY | RESPIRATORY_TRACT | Status: DC | PRN
  Administered 2019-12-20: 2 via RESPIRATORY_TRACT
  Filled 2019-12-20: qty 6.7

## 2019-12-20 NOTE — Discharge Instructions (Signed)
Take Tylenol for pain and follow-up with your family doctor next week for recheck.  Your Covid test to be back in a few days

## 2019-12-20 NOTE — ED Triage Notes (Signed)
Pt presents to ED with complaints of mid chest pain radiates around right side of back and down left arm x 1 hour. Pt states pain feels like stabbing.

## 2019-12-20 NOTE — ED Notes (Signed)
Neither signature pad would work for patient to sign discharge. Discharge instructions given to patient.

## 2019-12-20 NOTE — ED Provider Notes (Signed)
[facility] EMERGENCY DEPARTMENT Provider Note   CSN: [id number] Arrival date & time: 12/20/19  2018     History Chief Complaint  Patient presents with  . Chest Pain    Amy [name] is a 23 y.o. female.  Patient complains of chest discomfort mild shortness of breath no fever chills  The history is provided by the patient. No language interpreter was used.  Chest Pain Pain location:  Substernal area Pain quality: aching   Pain radiates to:  Does not radiate Pain severity:  Mild Onset quality:  Sudden Timing:  Constant Progression:  Waxing and waning Chronicity:  New Associated symptoms: no abdominal pain, no back pain, no cough, no fatigue and no headache        Past Medical History:  Diagnosis Date  . Asthma   . Migraines   . Seasonal allergies     Patient Active Problem List   Diagnosis Date Noted  . Asthma 02/25/2019  . Eczema 02/25/2019    Past Surgical History:  Procedure Laterality Date  . TONSILLECTOMY    . WISDOM TOOTH EXTRACTION       OB History   No obstetric history on file.     Family History  Problem Relation Age of Onset  . Diabetes Mother   . Hypertension Mother   . Diabetes Father   . Cancer Father        basal cell cancer  . Cancer Maternal Uncle        brain    Social History   Tobacco Use  . Smoking status: Never Smoker  . Smokeless tobacco: Never Used  Substance Use Topics  . Alcohol use: Yes    Comment: occ  . Drug use: No    Home Medications Prior to Admission medications   Medication Sig Start Date End Date Taking? Authorizing Provider  DULERA 100-5 MCG/ACT AERO INHALE 2 PUFFS BY MOUTH TWICE DAILY. RINSE MOUTH AFTER EACH USE 11/30/19   Soyla Dryer, PA-C  meloxicam (MOBIC) 15 MG tablet TAKE 1 Tablet BY MOUTH ONCE DAILY AS NEEDED FOR PAIN 11/30/19   Soyla Dryer, PA-C  montelukast (SINGULAIR) 10 MG tablet TAKE 1 Tablet BY MOUTH ONCE EVERY NIGHT AT BEDTIME 11/30/19   Soyla Dryer, PA-C  PROVENTIL  HFA 108 (90 Base) MCG/ACT inhaler INHALE 2 PUFFS BY MOUTH EVERY 6 HOURS AS NEEDED FOR COUGHING, WHEEZING, OR SHORTNESS OF BREATH Patient not taking: Reported on 11/02/2019 09/01/19   Soyla Dryer, PA-C    Allergies    Cough syrup [guaifenesin] and Other  Review of Systems   Review of Systems  Constitutional: Negative for appetite change and fatigue.  HENT: Negative for congestion, ear discharge and sinus pressure.   Eyes: Negative for discharge.  Respiratory: Negative for cough.   Cardiovascular: Positive for chest pain.  Gastrointestinal: Negative for abdominal pain and diarrhea.  Genitourinary: Negative for frequency and hematuria.  Musculoskeletal: Negative for back pain.  Skin: Negative for rash.  Neurological: Negative for seizures and headaches.  Psychiatric/Behavioral: Negative for hallucinations.    Physical Exam Updated Vital Signs BP 139/70 (BP Location: Right Arm)   Pulse 85   Temp (!) 97.5 F (36.4 C) (Oral)   Resp 20   Ht 5\' 4"  (1.626 m)   Wt 87.1 kg   LMP 12/16/2019   SpO2 100%   BMI 32.96 kg/m   Physical Exam Vitals and nursing note reviewed.  Constitutional:      Appearance: She is well-developed.  HENT:  Head: Normocephalic.     Nose: Nose normal.  Eyes:     General: No scleral icterus.    Conjunctiva/sclera: Conjunctivae normal.  Neck:     Thyroid: No thyromegaly.  Cardiovascular:     Rate and Rhythm: Normal rate and regular rhythm.     Heart sounds: No murmur. No friction rub. No gallop.   Pulmonary:     Breath sounds: No stridor. No wheezing or rales.  Chest:     Chest wall: No tenderness.  Abdominal:     General: There is no distension.     Tenderness: There is no abdominal tenderness. There is no rebound.  Musculoskeletal:        General: Normal range of motion.     Cervical back: Neck supple.  Lymphadenopathy:     Cervical: No cervical adenopathy.  Skin:    Findings: No erythema or rash.  Neurological:     Mental Status: She  is oriented to person, place, and time.     Motor: No abnormal muscle tone.     Coordination: Coordination normal.  Psychiatric:        Behavior: Behavior normal.     ED Results / Procedures / Treatments   Labs (all labs ordered are listed, but only abnormal results are displayed) Labs Reviewed  NOVEL CORONAVIRUS, NAA (HOSP ORDER, SEND-OUT TO REF LAB; TAT 18-24 HRS)    EKG EKG Interpretation  Date/Time:  Sunday December 20 2019 20:43:01 EST Ventricular Rate:  80 PR Interval:  146 QRS Duration: 84 QT Interval:  400 QTC Calculation: 461 R Axis:   77 Text Interpretation: Normal sinus rhythm with sinus arrhythmia Normal ECG Confirmed by Bethann Berkshire [phone]) on 12/20/2019 10:36:16 PM   Radiology No results found.  Procedures Procedures (including critical care time)  Medications Ordered in ED Medications  albuterol (VENTOLIN HFA) 108 (90 Base) MCG/ACT inhaler 2 puff (has no administration in time range)    ED Course  I have reviewed the triage vital signs and the nursing notes.  Pertinent labs & imaging results that were available during my care of the patient were reviewed by me and considered in my medical decision making (see chart for details).    MDM Rules/Calculators/A&P                      EKG unremarkable.  Patient will get a chest x-ray and also given albuterol inhaler to see if that helps her breathing and she will get a Covid test sent out.  Chest x-ray unremarkable.  Patient with atypical chest pain she was discharged home with albuterol inhaler Final Clinical Impression(s) / ED Diagnoses Final diagnoses:  None    Rx / DC Orders ED Discharge Orders    None       Bethann Berkshire, MD 12/20/19 2259

## 2019-12-22 LAB — NOVEL CORONAVIRUS, NAA (HOSP ORDER, SEND-OUT TO REF LAB; TAT 18-24 HRS): SARS-CoV-2, NAA: NOT DETECTED

## 2019-12-27 DIAGNOSIS — Z3490 Encounter for supervision of normal pregnancy, unspecified, unspecified trimester: Secondary | ICD-10-CM

## 2019-12-27 HISTORY — DX: Encounter for supervision of normal pregnancy, unspecified, unspecified trimester: Z34.90

## 2020-02-13 ENCOUNTER — Ambulatory Visit
Admission: EM | Admit: 2020-02-13 | Discharge: 2020-02-13 | Disposition: A | Discharge status: Home / Self Care | Payer: Self-pay | Attending: Emergency Medicine | Admitting: Emergency Medicine

## 2020-02-13 ENCOUNTER — Other Ambulatory Visit: Payer: Self-pay

## 2020-02-13 DIAGNOSIS — T07XXXA Unspecified multiple injuries, initial encounter: Secondary | ICD-10-CM

## 2020-02-13 DIAGNOSIS — L209 Atopic dermatitis, unspecified: Secondary | ICD-10-CM

## 2020-02-13 DIAGNOSIS — L299 Pruritus, unspecified: Secondary | ICD-10-CM

## 2020-02-13 DIAGNOSIS — L301 Dyshidrosis [pompholyx]: Secondary | ICD-10-CM

## 2020-02-13 MED ORDER — DOXYCYCLINE HYCLATE 100 MG PO CAPS: 100.0000 mg | ORAL_CAPSULE | Freq: Two times a day (BID) | ORAL | 0 refills | Status: DC

## 2020-02-13 MED ORDER — HYDROXYZINE HCL 25 MG PO TABS: 25.0000 mg | ORAL_TABLET | Freq: Every day | ORAL | 0 refills | Status: DC

## 2020-02-13 MED ORDER — PREDNISONE 10 MG (21) PO TBPK: ORAL_TABLET | Freq: Every day | ORAL | 0 refills | Status: DC

## 2020-02-13 NOTE — ED Provider Notes (Addendum)
[facility]   [id number] 02/13/20 Arrival Time: 7564  CC: Rash and itching  SUBJECTIVE:  Amy [name] is a 24 y.o. female who presents with a itchy rash x 3 years.  Denies precipitating event or trauma.  Denies changes in soaps, detergents, close contacts with similar rash, known trigger, or allergy. Denies medications change or starting a new medication recently.  Rash diffuse about the body including extremities and trunk.  Describes it as itchy and dry.  Has tried OTC medications and moisturizers without relief. Was seen by dermatology and told it was eczema.  Symptoms are made worse with scratching.  Reports similar symptoms in the past that improved with steroid and antibiotic.   Denies fever, chills, nausea, vomiting, swelling, discharge, oral or genital lesions, SOB, chest pain, abdominal pain, changes in bowel or bladder function.    ROS: As per HPI.  All other pertinent ROS negative.     Past Medical History:  Diagnosis Date  . Asthma   . Migraines   . Seasonal allergies    Past Surgical History:  Procedure Laterality Date  . TONSILLECTOMY    . WISDOM TOOTH EXTRACTION     Allergies  Allergen Reactions  . Cough Syrup [Guaifenesin] Nausea And Vomiting  . Other Nausea And Vomiting    Any coated medications   No current facility-administered medications on file prior to encounter.   Current Outpatient Medications on File Prior to Encounter  Medication Sig Dispense Refill  . DULERA 100-5 MCG/ACT AERO INHALE 2 PUFFS BY MOUTH TWICE DAILY. RINSE MOUTH AFTER EACH USE (Patient taking differently: Inhale 2 puffs into the lungs 2 (two) times daily. ) 39 g 1  . meloxicam (MOBIC) 15 MG tablet TAKE 1 Tablet BY MOUTH ONCE DAILY AS NEEDED FOR PAIN (Patient taking differently: Take 15 mg by mouth daily as needed for pain. ) 30 tablet 0  . montelukast (SINGULAIR) 10 MG tablet TAKE 1 Tablet BY MOUTH ONCE EVERY NIGHT AT BEDTIME (Patient taking differently: Take 10 mg by mouth  at bedtime. ) 90 tablet 0  . [DISCONTINUED] PROVENTIL HFA 108 (90 Base) MCG/ACT inhaler INHALE 2 PUFFS BY MOUTH EVERY 6 HOURS AS NEEDED FOR COUGHING, WHEEZING, OR SHORTNESS OF BREATH (Patient not taking: Reported on 11/02/2019) 20.1 g 1   Social History   Socioeconomic History  . Marital status: Single    Spouse name: Not on file  . Number of children: Not on file  . Years of education: Not on file  . Highest education level: Not on file  Occupational History  . Not on file  Tobacco Use  . Smoking status: Never Smoker  . Smokeless tobacco: Never Used  Substance and Sexual Activity  . Alcohol use: Yes    Comment: occ  . Drug use: No  . Sexual activity: Not on file  Other Topics Concern  . Not on file  Social History Narrative  . Not on file   Social Determinants of Health   Financial Resource Strain:   . Difficulty of Paying Living Expenses: Not on file  Food Insecurity:   . Worried About Charity fundraiser in the Last Year: Not on file  . Ran Out of Food in the Last Year: Not on file  Transportation Needs:   . Lack of Transportation (Medical): Not on file  . Lack of Transportation (Non-Medical): Not on file  Physical Activity:   . Days of Exercise per Week: Not on file  . Minutes of Exercise per  Session: Not on file  Stress:   . Feeling of Stress : Not on file  Social Connections:   . Frequency of Communication with Friends and Family: Not on file  . Frequency of Social Gatherings with Friends and Family: Not on file  . Attends Religious Services: Not on file  . Active Member of Clubs or Organizations: Not on file  . Attends Banker Meetings: Not on file  . Marital Status: Not on file  Intimate Partner Violence:   . Fear of Current or Ex-Partner: Not on file  . Emotionally Abused: Not on file  . Physically Abused: Not on file  . Sexually Abused: Not on file   Family History  Problem Relation Age of Onset  . Diabetes Mother   . Hypertension Mother    . Diabetes Father   . Cancer Father        basal cell cancer  . Cancer Maternal Uncle        brain    OBJECTIVE: Vitals:   02/13/20 1442  BP: 129/81  Pulse: (!) 111  Resp: 20  Temp: 98.1 F (36.7 C)  SpO2: 97%    General appearance: alert; appears uncomfortable, no distress Head: NCAT Neck: FROM Lungs: normal respiratory effort Extremities: no edema Skin: warm and dry; multiple excoriations over bilateral upper and lower extremities in various stages of healing, no open wounds with erythema, or purulent discharge; skin appears thickened and dry, fleshy papules over bilateral hands Psychological: alert and cooperative; normal mood and affect  ASSESSMENT & PLAN:  1. Dyshidrotic eczema   2. Excoriation of multiple sites   3. Itching   4. Atopic dermatitis, unspecified type     Meds ordered this encounter  Medications  . doxycycline (VIBRAMYCIN) 100 MG capsule    Sig: Take 1 capsule (100 mg total) by mouth 2 (two) times daily.    Dispense:  20 capsule    Refill:  0    Order Specific Question:   Supervising Provider    Answer:   Eustace Moore [[id number]]  . predniSONE (STERAPRED UNI-PAK 21 TAB) 10 MG (21) TBPK tablet    Sig: Take by mouth daily. Take 6 tabs by mouth daily  for 2 days, then 5 tabs for 2 days, then 4 tabs for 2 days, then 3 tabs for 2 days, 2 tabs for 2 days, then 1 tab by mouth daily for 2 days    Dispense:  42 tablet    Refill:  0    Order Specific Question:   Supervising Provider    Answer:   Eustace Moore [[id number]]  . hydrOXYzine (ATARAX/VISTARIL) 25 MG tablet    Sig: Take 1 tablet (25 mg total) by mouth at bedtime.    Dispense:  20 tablet    Refill:  0    Order Specific Question:   Supervising Provider    Answer:   Eustace Moore [[id number]]    Prednisone prescribed.  Take as directed and to completion Doxycycline for possible secondary infection related to scratching You may use OTC zyrtec (antihistamine), or calamine  lotion Hydroxyzine prescribed.  Use at night for nighttime relief of itching Avoid hot showers/ baths.  Try oatmeal baths instead to help sooth and moisturize skin Moisturize skin daily.  Wrap extremities with ace bandages or wear gloves to help lock in moisture Follow up with dermatology for further evaluation and management Return or go to the ER if you have any new or worsening symptoms such  as fever, chills, nausea, vomiting, redness, swelling, discharge, if symptoms do not improve with medications, etc...  Reviewed expectations re: course of current medical issues. Questions answered. Outlined signs and symptoms indicating need for more acute intervention. Patient verbalized understanding. After Visit Summary given.   Rennis Harding, PA-C 02/13/20 1505    Alvino Chapel [place], PA-C 02/13/20 1506

## 2020-02-13 NOTE — ED Triage Notes (Signed)
Pt presents with rash that she has had for past 3 years , pt last seen over a year ago. . Pt has rash all over and states that it is painful

## 2020-02-13 NOTE — Discharge Instructions (Signed)
Prednisone prescribed.  Take as directed and to completion Doxycycline for possible secondary infection related to scratching You may use OTC zyrtec (antihistamine), or calamine lotion Hydroxyzine prescribed.  Use at night for nighttime relief of itching Avoid hot showers/ baths.  Try oatmeal baths instead to help sooth and moisturize skin Moisturize skin daily.  Wrap extremities with ace bandages or wear gloves to help lock in moisture Follow up with dermatology for further evaluation and management Return or go to the ER if you have any new or worsening symptoms such as fever, chills, nausea, vomiting, redness, swelling, discharge, if symptoms do not improve with medications, etc..[name]

## 2020-03-09 ENCOUNTER — Ambulatory Visit
Admission: EM | Admit: 2020-03-09 | Discharge: 2020-03-09 | Disposition: A | Discharge status: Home / Self Care | Payer: Medicaid Other | Attending: Emergency Medicine | Admitting: Emergency Medicine

## 2020-03-09 ENCOUNTER — Other Ambulatory Visit: Payer: Self-pay

## 2020-03-09 DIAGNOSIS — B86 Scabies: Secondary | ICD-10-CM

## 2020-03-09 MED ORDER — PERMETHRIN 5 % EX CREA: TOPICAL_CREAM | CUTANEOUS | 1 refills | Status: DC

## 2020-03-09 NOTE — Discharge Instructions (Signed)
Use the prescribed cream as directed.  See the attached information for additional instructions.    Follow-up with a dermatologist if your symptoms are not improving.

## 2020-03-09 NOTE — ED Triage Notes (Signed)
Patient presents to UC with a rash for that has been ongoing x 3 years. Patient has a rash all over her body.

## 2020-03-09 NOTE — ED Provider Notes (Signed)
[facility]    CSN: [id number] Arrival date & time: 03/09/20  1048      History   Chief Complaint Chief Complaint  Patient presents with  . Rash    HPI Amy [name] is a 24 y.o. female.   Patient presents with a pruritic rash on her face, neck, abdomen, and extremities intermittently x3 years; worse x1 week.  She states it is extremely pruritic and "feels like something crawling on me".  She states no one else in her household has a rash.  Patient was seen at the Urgent Care for similar rash on 02/13/2020 and treated with prednisone, hydroxyzine, and doxycycline; she states her rash partially cleared temporarily with this treatment but then returned.  She denies fever, chills, or other symptoms.    The history is provided by the patient.    Past Medical History:  Diagnosis Date  . Asthma   . Migraines   . Seasonal allergies     Patient Active Problem List   Diagnosis Date Noted  . Asthma 02/25/2019  . Eczema 02/25/2019    Past Surgical History:  Procedure Laterality Date  . TONSILLECTOMY    . WISDOM TOOTH EXTRACTION      OB History   No obstetric history on file.      Home Medications    Prior to Admission medications   Medication Sig Start Date End Date Taking? Authorizing Provider  DULERA 100-5 MCG/ACT AERO INHALE 2 PUFFS BY MOUTH TWICE DAILY. RINSE MOUTH AFTER EACH USE Patient taking differently: Inhale 2 puffs into the lungs 2 (two) times daily.  11/30/19  Yes Jacquelin Hawking, PA-C  meloxicam (MOBIC) 15 MG tablet TAKE 1 Tablet BY MOUTH ONCE DAILY AS NEEDED FOR PAIN Patient taking differently: Take 15 mg by mouth daily as needed for pain.  11/30/19  Yes Jacquelin Hawking, PA-C  montelukast (SINGULAIR) 10 MG tablet TAKE 1 Tablet BY MOUTH ONCE EVERY NIGHT AT BEDTIME Patient taking differently: Take 10 mg by mouth at bedtime.  11/30/19  Yes Jacquelin Hawking, PA-C  doxycycline (VIBRAMYCIN) 100 MG capsule Take 1 capsule (100 mg total) by mouth 2 (two)  times daily. 02/13/20   Wurst, [place], PA-C  hydrOXYzine (ATARAX/VISTARIL) 25 MG tablet Take 1 tablet (25 mg total) by mouth at bedtime. 02/13/20   Wurst, [place], PA-C  permethrin (ELIMITE) 5 % cream Apply to affected area.  Leave on for 8-14 hours, then rinse off.  May repeat treatment in 14 days. 03/09/20   Mickie Bail, NP  predniSONE (STERAPRED UNI-PAK 21 TAB) 10 MG (21) TBPK tablet Take by mouth daily. Take 6 tabs by mouth daily  for 2 days, then 5 tabs for 2 days, then 4 tabs for 2 days, then 3 tabs for 2 days, 2 tabs for 2 days, then 1 tab by mouth daily for 2 days 02/13/20   Wurst, [place], PA-C  PROVENTIL HFA 108 (90 Base) MCG/ACT inhaler INHALE 2 PUFFS BY MOUTH EVERY 6 HOURS AS NEEDED FOR COUGHING, WHEEZING, OR SHORTNESS OF BREATH Patient not taking: Reported on 11/02/2019 09/01/19 02/13/20  Jacquelin Hawking, PA-C    Family History Family History  Problem Relation Age of Onset  . Diabetes Mother   . Hypertension Mother   . Diabetes Father   . Cancer Father        basal cell cancer  . Cancer Maternal Uncle        brain    Social History Social History   Tobacco Use  .  Smoking status: Never Smoker  . Smokeless tobacco: Never Used  Substance Use Topics  . Alcohol use: Yes    Comment: occ  . Drug use: No     Allergies   Cough syrup [guaifenesin] and Other   Review of Systems Review of Systems  Constitutional: Negative for chills and fever.  HENT: Negative for ear pain and sore throat.   Eyes: Negative for pain and visual disturbance.  Respiratory: Negative for cough and shortness of breath.   Cardiovascular: Negative for chest pain and palpitations.  Gastrointestinal: Negative for abdominal pain and vomiting.  Genitourinary: Negative for dysuria and hematuria.  Musculoskeletal: Negative for arthralgias and back pain.  Skin: Positive for rash. Negative for color change.  Neurological: Negative for seizures and syncope.  All other systems reviewed and are  negative.    Physical Exam Triage Vital Signs ED Triage Vitals  Enc Vitals Group     BP      Pulse      Resp      Temp      Temp src      SpO2      Weight      Height      Head Circumference      Peak Flow      Pain Score      Pain Loc      Pain Edu?      Excl. in GC?    No data found.  Updated Vital Signs BP 123/78 (BP Location: Left Arm)   Pulse 93   Temp 98.2 F (36.8 C) (Oral)   Resp 16   Ht 5\' 4"  (1.626 m)   Wt 189 lb 12.8 oz (86.1 kg)   LMP 02/28/2020   SpO2 97%   BMI 32.58 kg/m   Visual Acuity Right Eye Distance:   Left Eye Distance:   Bilateral Distance:    Right Eye Near:   Left Eye Near:    Bilateral Near:     Physical Exam Vitals and nursing note reviewed.  Constitutional:      General: She is not in acute distress.    Appearance: She is well-developed.  HENT:     Head: Normocephalic and atraumatic.     Mouth/Throat:     Mouth: Mucous membranes are moist.     Pharynx: Oropharynx is clear.  Eyes:     Conjunctiva/sclera: Conjunctivae normal.  Cardiovascular:     Rate and Rhythm: Normal rate and regular rhythm.     Heart sounds: No murmur.  Pulmonary:     Effort: Pulmonary effort is normal. No respiratory distress.     Breath sounds: Normal breath sounds.  Abdominal:     Palpations: Abdomen is soft.     Tenderness: There is no abdominal tenderness. There is no guarding or rebound.  Musculoskeletal:     Cervical back: Neck supple.  Skin:    General: Skin is warm and dry.     Findings: Rash present.     Comments: Excoriated, erythematous papular rash in linear pattern on abdomen, extremities, neck.  Worse in antecubital spaces.  No drainage.   Neurological:     General: No focal deficit present.     Mental Status: She is alert and oriented to person, place, and time.  Psychiatric:        Mood and Affect: Mood normal.        Behavior: Behavior normal.      UC Treatments / Results  Labs (all labs  ordered are listed, but only  abnormal results are displayed) Labs Reviewed - No data to display  EKG   Radiology No results found.  Procedures Procedures (including critical care time)  Medications Ordered in UC Medications - No data to display  Initial Impression / Assessment and Plan / UC Course  I have reviewed the triage vital signs and the nursing notes.  Pertinent labs & imaging results that were available during my care of the patient were reviewed by me and considered in my medical decision making (see chart for details).   Scabies.  Treating with permethrin.  Discussed with patient that if her symptoms do not resolve with this treatment that she needs to follow-up with a dermatologist.  Patient agrees to plan of care.     Final Clinical Impressions(s) / UC Diagnoses   Final diagnoses:  Scabies     Discharge Instructions     Use the prescribed cream as directed.  See the attached information for additional instructions.    Follow-up with a dermatologist if your symptoms are not improving.        ED Prescriptions    Medication Sig Dispense Auth. Provider   permethrin (ELIMITE) 5 % cream Apply to affected area.  Leave on for 8-14 hours, then rinse off.  May repeat treatment in 14 days. 60 g Sharion Balloon, NP     PDMP not reviewed this encounter.   Sharion Balloon, NP 03/09/20 1214

## 2020-03-11 ENCOUNTER — Emergency Department
Admission: EM | Admit: 2020-03-11 | Discharge: 2020-03-11 | Disposition: A | Discharge status: Home / Self Care | Payer: Medicaid Other | Attending: Emergency Medicine | Admitting: Emergency Medicine

## 2020-03-11 ENCOUNTER — Other Ambulatory Visit: Payer: Self-pay

## 2020-03-11 ENCOUNTER — Encounter: Payer: Self-pay | Admitting: Emergency Medicine

## 2020-03-11 DIAGNOSIS — J45909 Unspecified asthma, uncomplicated: Secondary | ICD-10-CM | POA: Insufficient documentation

## 2020-03-11 DIAGNOSIS — R21 Rash and other nonspecific skin eruption: Secondary | ICD-10-CM | POA: Insufficient documentation

## 2020-03-11 DIAGNOSIS — Z79899 Other long term (current) drug therapy: Secondary | ICD-10-CM | POA: Insufficient documentation

## 2020-03-11 DIAGNOSIS — L309 Dermatitis, unspecified: Secondary | ICD-10-CM | POA: Insufficient documentation

## 2020-03-11 LAB — CBC WITH DIFFERENTIAL/PLATELET
Abs Immature Granulocytes: 0.04 10*3/uL (ref 0.00–0.07)
Basophils Absolute: 0.1 10*3/uL (ref 0.0–0.1)
Basophils Relative: 1 %
Eosinophils Absolute: 0.7 10*3/uL — ABNORMAL HIGH (ref 0.0–0.5)
Eosinophils Relative: 7 %
HCT: 43.4 % (ref 36.0–46.0)
Hemoglobin: 14.3 g/dL (ref 12.0–15.0)
Immature Granulocytes: 0 %
Lymphocytes Relative: 15 %
Lymphs Abs: 1.6 10*3/uL (ref 0.7–4.0)
MCH: 27.6 pg (ref 26.0–34.0)
MCHC: 32.9 g/dL (ref 30.0–36.0)
MCV: 83.6 fL (ref 80.0–100.0)
Monocytes Absolute: 0.6 10*3/uL (ref 0.1–1.0)
Monocytes Relative: 5 %
Neutro Abs: 7.7 10*3/uL (ref 1.7–7.7)
Neutrophils Relative %: 72 %
Platelets: 230 10*3/uL (ref 150–400)
RBC: 5.19 MIL/uL — ABNORMAL HIGH (ref 3.87–5.11)
RDW: 12.7 % (ref 11.5–15.5)
WBC: 10.6 10*3/uL — ABNORMAL HIGH (ref 4.0–10.5)
nRBC: 0 % (ref 0.0–0.2)

## 2020-03-11 LAB — COMPREHENSIVE METABOLIC PANEL
ALT: 33 U/L (ref 0–44)
AST: 30 U/L (ref 15–41)
Albumin: 4.3 g/dL (ref 3.5–5.0)
Alkaline Phosphatase: 115 U/L (ref 38–126)
Anion gap: 11 (ref 5–15)
BUN: 17 mg/dL (ref 6–20)
CO2: 21 mmol/L — ABNORMAL LOW (ref 22–32)
Calcium: 9.2 mg/dL (ref 8.9–10.3)
Chloride: 109 mmol/L (ref 98–111)
Creatinine, Ser: 0.91 mg/dL (ref 0.44–1.00)
GFR calc Af Amer: >60 mL/min (ref >60)
GFR calc non Af Amer: >60 mL/min (ref >60)
Glucose, Bld: 109 mg/dL — ABNORMAL HIGH (ref 70–99)
Potassium: 3.9 mmol/L (ref 3.5–5.1)
Sodium: 141 mmol/L (ref 135–145)
Total Bilirubin: 0.9 mg/dL (ref 0.3–1.2)
Total Protein: 6.9 g/dL (ref 6.5–8.1)

## 2020-03-11 MED ORDER — PREDNISONE 10 MG PO TABS: ORAL_TABLET | ORAL | 0 refills | Status: DC

## 2020-03-11 MED ORDER — HYDROXYZINE HCL 25 MG PO TABS: 25.0000 mg | ORAL_TABLET | Freq: Four times a day (QID) | ORAL | 1 refills | Status: DC | PRN

## 2020-03-11 MED ORDER — DEXAMETHASONE SODIUM PHOSPHATE 10 MG/ML IJ SOLN
10.0000 mg | Freq: Once | INTRAMUSCULAR | Status: AC
  Administered 2020-03-11: 10 mg via INTRAVENOUS
  Filled 2020-03-11: qty 1

## 2020-03-11 MED ORDER — MOMETASONE FUROATE 0.1 % EX CREA: TOPICAL_CREAM | CUTANEOUS | 2 refills | Status: DC

## 2020-03-11 NOTE — Discharge Instructions (Signed)
Follow-up with one of the dermatologist listed on your discharge papers.  Call today as there office appointments may be several weeks to months out.  Begin taking prednisone as directed and Atarax if needed for itching.  This medication could cause drowsiness so do not drive while taking the Atarax.  Elocon cream is to affected areas once a day.  Also when bathing use very little soap which may be an irritant to your skin.  While your skin is still wet apply a very thin layer of alpha Keri bath oil to your skin and do not tell dry off.  This will help hydrate your skin to make it less scaly which may help with your itching.

## 2020-03-11 NOTE — ED Provider Notes (Signed)
[facility] Emergency Department Provider Note  ____________________________________________   First MD Initiated Contact with Patient 03/11/20 1116     (approximate)  I have reviewed the triage vital signs and the nursing notes.   HISTORY  Chief Complaint Rash   HPI Amy [name] is a 24 y.o. female presents to the ED with complaint of fine erythematous rash over the abdomen and extremities that itches at times becomes painful.  Patient states this is been going on for 3 years and usually she goes to an urgent care.  She states that it does get better when she is taken prednisone.  No one has ever referred her to a dermatologist.  Patient states that she has seasonal allergies and also asthma.  She continues  to take Singulair and inhaler for her asthma.  Patient denies any fever, chills, nausea or vomiting.  She states that she does scratch at certain areas until it bleeds.  Rates her pain as an 8 out of 10.     Past Medical History:  Diagnosis Date  . Asthma   . Migraines   . Seasonal allergies     Patient Active Problem List   Diagnosis Date Noted  . Asthma 02/25/2019  . Eczema 02/25/2019    Past Surgical History:  Procedure Laterality Date  . TONSILLECTOMY    . WISDOM TOOTH EXTRACTION      Prior to Admission medications   Medication Sig Start Date End Date Taking? Authorizing Provider  DULERA 100-5 MCG/ACT AERO INHALE 2 PUFFS BY MOUTH TWICE DAILY. RINSE MOUTH AFTER EACH USE Patient taking differently: Inhale 2 puffs into the lungs 2 (two) times daily.  11/30/19   Jacquelin Hawking, PA-C  hydrOXYzine (ATARAX/VISTARIL) 25 MG tablet Take 1 tablet (25 mg total) by mouth every 6 (six) hours as needed for itching. 03/11/20   Tommi Rumps, PA-C  meloxicam (MOBIC) 15 MG tablet TAKE 1 Tablet BY MOUTH ONCE DAILY AS NEEDED FOR PAIN Patient taking differently: Take 15 mg by mouth daily as needed for pain.  11/30/19   Jacquelin Hawking, PA-C   mometasone (ELOCON) 0.1 % cream Apply to affected area once a day 03/11/20 03/11/21  Bridget Hartshorn L, PA-C  montelukast (SINGULAIR) 10 MG tablet TAKE 1 Tablet BY MOUTH ONCE EVERY NIGHT AT BEDTIME Patient taking differently: Take 10 mg by mouth at bedtime.  11/30/19   Jacquelin Hawking, PA-C  predniSONE (DELTASONE) 10 MG tablet Take 6 tablets  today, on day 2 take 5 tablets, day 3 take 4 tablets, day 4 take 3 tablets, day 5 take  2 tablets and 1 tablet the last day 03/11/20   Tommi Rumps, PA-C  PROVENTIL HFA 108 (90 Base) MCG/ACT inhaler INHALE 2 PUFFS BY MOUTH EVERY 6 HOURS AS NEEDED FOR COUGHING, WHEEZING, OR SHORTNESS OF BREATH Patient not taking: Reported on 11/02/2019 09/01/19 02/13/20  Jacquelin Hawking, PA-C    Allergies Cough syrup [guaifenesin] and Other  Family History  Problem Relation Age of Onset  . Diabetes Mother   . Hypertension Mother   . Diabetes Father   . Cancer Father        basal cell cancer  . Cancer Maternal Uncle        brain    Social History Social History   Tobacco Use  . Smoking status: Never Smoker  . Smokeless tobacco: Never Used  Substance Use Topics  . Alcohol use: Yes    Comment: occ  . Drug use: No  Review of Systems Constitutional: No fever/chills Eyes: No visual changes. ENT: No sore throat. Cardiovascular: Denies chest pain. Respiratory: Denies shortness of breath.  Negative for cough. Gastrointestinal: No abdominal pain.  No nausea, no vomiting.  No diarrhea. Musculoskeletal: Negative for muscle aches. Skin: Positive for rash. Neurological: Negative for headaches, focal weakness or numbness. ____________________________________________   PHYSICAL EXAM:  VITAL SIGNS: ED Triage Vitals  Enc Vitals Group     BP 03/11/20 1121 120/70     Pulse Rate 03/11/20 1121 78     Resp 03/11/20 1121 20     Temp 03/11/20 1121 98 F (36.7 C)     Temp Source 03/11/20 1121 Oral     SpO2 03/11/20 1121 98 %     Weight 03/11/20 1118 184 lb  (83.5 kg)     Height 03/11/20 1118 5\' 4"  (1.626 m)     Head Circumference --      Peak Flow --      Pain Score 03/11/20 1117 8     Pain Loc --      Pain Edu? --      Excl. in GC? --     Constitutional: Alert and oriented. Well appearing and in no acute distress. Eyes: Conjunctivae are normal. PERRL. EOMI. Head: Atraumatic. Nose: No congestion/rhinnorhea. Neck: No stridor.   Cardiovascular: Normal rate, regular rhythm. Grossly normal heart sounds.  Good peripheral circulation. Respiratory: Normal respiratory effort.  No retractions. Lungs CTAB. Gastrointestinal: Soft and nontender. No distention. Musculoskeletal: Moves upper and lower extremities with any difficulty normal gait was noted. Neurologic:  Normal speech and language. No gross focal neurologic deficits are appreciated. No gait instability. Skin:  Skin is warm, dry.  Upper and lower extremities are extremely dry with a scaly sensation when touching the areas.  There is diffuse erythema and excoriation from patient scratching.  The back is spared.  Patient does have involvement on her abdomen which is also extremely dry and scaly.  No warmth or drainage is noted from these areas. Psychiatric: Mood and affect are normal. Speech and behavior are normal.  ____________________________________________   LABS (all labs ordered are listed, but only abnormal results are displayed)  Labs Reviewed  CBC WITH DIFFERENTIAL/PLATELET - Abnormal; Notable for the following components:      Result Value   WBC 10.6 (*)    RBC 5.19 (*)    Eosinophils Absolute 0.7 (*)    All other components within normal limits  COMPREHENSIVE METABOLIC PANEL - Abnormal; Notable for the following components:   CO2 21 (*)    Glucose, Bld 109 (*)    All other components within normal limits     PROCEDURES  Procedure(s) performed (including Critical Care):  Procedures  ____________________________________________   INITIAL IMPRESSION / ASSESSMENT  AND PLAN / ED COURSE  As part of my medical decision making, I reviewed the following data within the electronic MEDICAL RECORD NUMBER Notes from prior ED visits and [place] Controlled Substance Database   24 year old female presents to the ED with complaint of rash that has been present for 3 years.  Patient has been to urgent cares and states that it is better when she is on prednisone.  Patient does have a history of asthma and continues to take Singulair along with her inhaler as needed.  Skin is extremely dry and scaly and rash is consistent with an atopic dermatitis/eczema.  Patient was given Decadron 10 mg IV while in the ED.  Lab work was reassuring and patient  was made aware.  She was given instructions to follow-up with one of the dermatology offices in [place].  She was discharged with a prescription for prednisone, Atarax and Elocon cream to apply to areas once daily.  ____________________________________________   FINAL CLINICAL IMPRESSION(S) / ED DIAGNOSES  Final diagnoses:  Rash and nonspecific skin eruption     ED Discharge Orders         Ordered    predniSONE (DELTASONE) 10 MG tablet     03/11/20 1304    mometasone (ELOCON) 0.1 % cream     03/11/20 1304    hydrOXYzine (ATARAX/VISTARIL) 25 MG tablet  Every 6 hours PRN     03/11/20 1304           Note:  This document was prepared using Dragon voice recognition software and may include unintentional dictation errors.    Johnn Hai, PA-C 03/11/20 1336    Harvest Dark, MD 03/11/20 1431

## 2020-03-11 NOTE — ED Triage Notes (Signed)
Presents with generalized fine rash  States rash is over entire body  And describes as painful    Noticed areas 3 years ago  Has been seen by Urgent care  Placed on prednisone and has relief until prednisone stops

## 2020-04-12 ENCOUNTER — Inpatient Hospital Stay
Admission: EM | Admit: 2020-04-12 | Discharge: 2020-04-14 | DRG: 203 | Disposition: A | Discharge status: Home / Self Care | Payer: Medicaid Other | Attending: Family Medicine | Admitting: Family Medicine

## 2020-04-12 ENCOUNTER — Other Ambulatory Visit: Payer: Self-pay

## 2020-04-12 ENCOUNTER — Encounter: Payer: Self-pay | Admitting: *Deleted

## 2020-04-12 DIAGNOSIS — J45902 Unspecified asthma with status asthmaticus: Secondary | ICD-10-CM

## 2020-04-12 DIAGNOSIS — T380X5A Adverse effect of glucocorticoids and synthetic analogues, initial encounter: Secondary | ICD-10-CM | POA: Diagnosis not present

## 2020-04-12 DIAGNOSIS — L309 Dermatitis, unspecified: Secondary | ICD-10-CM | POA: Diagnosis present

## 2020-04-12 DIAGNOSIS — Z8249 Family history of ischemic heart disease and other diseases of the circulatory system: Secondary | ICD-10-CM

## 2020-04-12 DIAGNOSIS — Z888 Allergy status to other drugs, medicaments and biological substances status: Secondary | ICD-10-CM

## 2020-04-12 DIAGNOSIS — J40 Bronchitis, not specified as acute or chronic: Secondary | ICD-10-CM | POA: Diagnosis present

## 2020-04-12 DIAGNOSIS — Y92239 Unspecified place in hospital as the place of occurrence of the external cause: Secondary | ICD-10-CM | POA: Diagnosis not present

## 2020-04-12 DIAGNOSIS — J4541 Moderate persistent asthma with (acute) exacerbation: Principal | ICD-10-CM

## 2020-04-12 DIAGNOSIS — Z79899 Other long term (current) drug therapy: Secondary | ICD-10-CM

## 2020-04-12 DIAGNOSIS — D72829 Elevated white blood cell count, unspecified: Secondary | ICD-10-CM | POA: Diagnosis not present

## 2020-04-12 DIAGNOSIS — J329 Chronic sinusitis, unspecified: Secondary | ICD-10-CM | POA: Diagnosis present

## 2020-04-12 DIAGNOSIS — Z791 Long term (current) use of non-steroidal anti-inflammatories (NSAID): Secondary | ICD-10-CM

## 2020-04-12 DIAGNOSIS — Z20822 Contact with and (suspected) exposure to covid-19: Secondary | ICD-10-CM | POA: Diagnosis present

## 2020-04-12 DIAGNOSIS — Z833 Family history of diabetes mellitus: Secondary | ICD-10-CM

## 2020-04-12 DIAGNOSIS — J45901 Unspecified asthma with (acute) exacerbation: Secondary | ICD-10-CM | POA: Diagnosis present

## 2020-04-12 DIAGNOSIS — Z808 Family history of malignant neoplasm of other organs or systems: Secondary | ICD-10-CM

## 2020-04-12 DIAGNOSIS — R0902 Hypoxemia: Secondary | ICD-10-CM | POA: Diagnosis present

## 2020-04-12 DIAGNOSIS — R0602 Shortness of breath: Secondary | ICD-10-CM | POA: Diagnosis present

## 2020-04-12 LAB — POC SARS CORONAVIRUS 2 AG: SARS Coronavirus 2 Ag: NEGATIVE

## 2020-04-12 MED ORDER — ALBUTEROL SULFATE HFA 108 (90 BASE) MCG/ACT IN AERS: INHALATION_SPRAY | RESPIRATORY_TRACT | 0 refills | Status: DC

## 2020-04-12 MED ORDER — IPRATROPIUM-ALBUTEROL 0.5-2.5 (3) MG/3ML IN SOLN
RESPIRATORY_TRACT | Status: AC
  Filled 2020-04-12: qty 6

## 2020-04-12 MED ORDER — IPRATROPIUM-ALBUTEROL 0.5-2.5 (3) MG/3ML IN SOLN
RESPIRATORY_TRACT | Status: AC
Start: 2020-04-12 — End: 2020-04-12
  Administered 2020-04-12: 3 mL via RESPIRATORY_TRACT
  Filled 2020-04-12: qty 3

## 2020-04-12 MED ORDER — IPRATROPIUM-ALBUTEROL 0.5-2.5 (3) MG/3ML IN SOLN
3.0000 mL | Freq: Once | RESPIRATORY_TRACT | Status: AC
  Administered 2020-04-12: 23:00:00 3 mL via RESPIRATORY_TRACT

## 2020-04-12 MED ORDER — PREDNISONE 20 MG PO TABS
60.0000 mg | ORAL_TABLET | ORAL | Status: AC
  Administered 2020-04-12: 60 mg via ORAL
  Filled 2020-04-12: qty 3

## 2020-04-12 MED ORDER — PREDNISONE 10 MG PO TABS: ORAL_TABLET | ORAL | 0 refills | Status: DC

## 2020-04-12 NOTE — ED Provider Notes (Signed)
[facility] Emergency Department Provider Note  ____________________________________________   First MD Initiated Contact with Patient 04/12/20 2305     (approximate)  I have reviewed the triage vital signs and the nursing notes.   HISTORY  Chief Complaint Shortness of Breath    HPI Amy [name] is a 24 y.o. female with a history of asthma and eczema since early childhood.  She presents in severe respiratory distress with what she believes is an asthma exacerbation.  Initially upon arrival she could not speak in complete sentences.  After a DuoNeb, at the time of my evaluation of her, she is able to speak in full sentences and her breathing is improved dramatically.  She said that she has felt increasingly short of breath over the last couple of days and it feels similar to prior exacerbations.  She has been hospitalized in the past for similar.  She has had no fever or chills other she subjectively felt hot earlier today.  She denies sore throat.  She has had a cough and exertion makes her symptoms worse.  She denies abdominal pain.  She said that she has had some vomiting of clear fluid and bile as a result of using her inhaler so much, which is something that has happened to her in the past.  Otherwise she has no persistent nausea or vomiting.  No known COVID-19 contacts and she has not had the vaccination and says that she will not do so.         Past Medical History:  Diagnosis Date  . Asthma   . Migraines   . Seasonal allergies     Patient Active Problem List   Diagnosis Date Noted  . 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 Asthma 02/25/2019  . Eczema 02/25/2019    Past Surgical History:  Procedure Laterality Date  . TONSILLECTOMY    . WISDOM TOOTH EXTRACTION      Prior to Admission medications   Medication Sig Start Date End Date Taking? Authorizing Provider  albuterol (VENTOLIN HFA) 108 (90  Base) MCG/ACT inhaler Inhale 2-4 puffs by mouth every 4 hours as needed for wheezing, cough, and/or shortness of breath 04/12/20   Loleta Rose, MD  DULERA 100-5 MCG/ACT AERO INHALE 2 PUFFS BY MOUTH TWICE DAILY. RINSE MOUTH AFTER EACH USE Patient taking differently: Inhale 2 puffs into the lungs 2 (two) times daily.  11/30/19   Jacquelin Hawking, PA-C  hydrOXYzine (ATARAX/VISTARIL) 25 MG tablet Take 1 tablet (25 mg total) by mouth every 6 (six) hours as needed for itching. 03/11/20   Tommi Rumps, PA-C  meloxicam (MOBIC) 15 MG tablet TAKE 1 Tablet BY MOUTH ONCE DAILY AS NEEDED FOR PAIN Patient taking differently: Take 15 mg by mouth daily as needed for pain.  11/30/19   Jacquelin Hawking, PA-C  mometasone (ELOCON) 0.1 % cream Apply to affected area once a day 03/11/20 03/11/21  Bridget Hartshorn L, PA-C  montelukast (SINGULAIR) 10 MG tablet TAKE 1 Tablet BY MOUTH ONCE EVERY NIGHT AT BEDTIME Patient taking differently: Take 10 mg by mouth at bedtime.  11/30/19   Jacquelin Hawking, PA-C  predniSONE (DELTASONE) 10 MG tablet Take 6 tabs (60 mg) PO x 3 days, then take 4 tabs (40 mg) PO x 3 days, then take 2 tabs (20 mg) PO x 3 days, then take 1 tab (10 mg) PO x 3 days, then take 1/2 tab (5 mg) PO x 4 days. 04/12/20   York Cerise,  Kandee Keen, MD    Allergies Cough syrup [guaifenesin] and Other  Family History  Problem Relation Age of Onset  . Diabetes Mother   . Hypertension Mother   . Diabetes Father   . Cancer Father        basal cell cancer  . Cancer Maternal Uncle        brain    Social History Social History   Tobacco Use  . Smoking status: Never Smoker  . Smokeless tobacco: Never Used  Substance Use Topics  . Alcohol use: Yes    Comment: occ  . Drug use: No    Review of Systems Constitutional: Subjective fever but no measured fever. Eyes: No visual changes. ENT: No sore throat. Cardiovascular: Denies chest pain. Respiratory: Shortness of breath, wheezing, cough, consistent with asthma  attack Gastrointestinal: Some "vomiting or spitting up" of clear fluid and bile after repeated usage of her inhaler which has happened to her in the past.  No abdominal pain.  No diarrhea.  No constipation. Genitourinary: Negative for dysuria. Musculoskeletal: Negative for neck pain.  Negative for back pain. Integumentary: Negative for rash. Neurological: Negative for headaches, focal weakness or numbness.   ____________________________________________   PHYSICAL EXAM:  VITAL SIGNS: ED Triage Vitals  Enc Vitals Group     BP 04/12/20 2236 135/81     Pulse Rate 04/12/20 2236 (!) 126     Resp 04/12/20 2239 (!) 24     Temp --      Temp src --      SpO2 04/12/20 2236 96 %     Weight 04/12/20 2240 81.6 kg (180 lb)     Height 04/12/20 2240 1.626 m (5\' 4" )     Head Circumference --      Peak Flow --      Pain Score 04/12/20 2240 6     Pain Loc --      Pain Edu? --      Excl. in GC? --     Constitutional: Alert and oriented.  At this point she is in no respiratory distress even though she was documented to be in substantial distress earlier prior to breathing treatment. Eyes: Conjunctivae are normal.  Head: Atraumatic. Nose: No congestion/rhinnorhea. Mouth/Throat: Patient is wearing a mask. Neck: No stridor.  No meningeal signs.   Cardiovascular: Normal rate, regular rhythm. Good peripheral circulation. Grossly normal heart sounds. Respiratory: Initially severe respiratory distress, now mild.  Persistent expiratory wheezing throughout lung fields, occasional dry cough.  No intercostal muscle usage or retractions at this time.  Able to speak in full sentences now. Gastrointestinal: Soft and nontender. No distention.  Musculoskeletal: No lower extremity tenderness nor edema. No gross deformities of extremities. Neurologic:  Normal speech and language. No gross focal neurologic deficits are appreciated.  Skin:  Skin is warm and dry.  Extensive eczematous changes throughout exposed skin  surfaces. Psychiatric: Mood and affect are normal. Speech and behavior are normal.  ____________________________________________   LABS (all labs ordered are listed, but only abnormal results are displayed)  Labs Reviewed  CBC WITH DIFFERENTIAL/PLATELET - Abnormal; Notable for the following components:      Result Value   WBC 14.9 (*)    RBC 5.23 (*)    Neutro Abs 11.6 (*)    All other components within normal limits  BASIC METABOLIC PANEL - Abnormal; Notable for the following components:   Potassium 3.4 (*)    Glucose, Bld 137 (*)    All other components within normal  limits  RESPIRATORY PANEL BY RT PCR (FLU A&B, COVID)  POC SARS CORONAVIRUS 2 AG -  ED  POC SARS CORONAVIRUS 2 AG   ____________________________________________  EKG  No indication for EKG ____________________________________________  RADIOLOGY I, Hinda Kehr, personally viewed and evaluated these images (plain radiographs) as part of my medical decision making, as well as reviewing the written report by the radiologist.  ED MD interpretation: No indication for emergent imaging  Official radiology report(s): DG Chest Portable 1 View  Result Date: 04/13/2020 CLINICAL DATA:  Cough, shortness of breath EXAM: PORTABLE CHEST 1 VIEW COMPARISON:  12/20/2019 FINDINGS: Heart and mediastinal contours are within normal limits. No focal opacities or effusions. No acute bony abnormality. IMPRESSION: No active disease. Electronically Signed   By: Rolm Baptise M.D.   On: 04/13/2020 01:34    ____________________________________________   PROCEDURES   Procedure(s) performed (including Critical Care):  .Critical Care Performed by: Hinda Kehr, MD Authorized by: Hinda Kehr, MD   Critical care provider statement:    Critical care time (minutes):  30   Critical care time was exclusive of:  Separately billable procedures and treating other patients   Critical care was necessary to treat or prevent imminent or  life-threatening deterioration of the following conditions:  Respiratory failure (status asthmaticus requiring >= 3 breathing treatments and still in distress)   Critical care was time spent personally by me on the following activities:  Development of treatment plan with patient or surrogate, discussions with consultants, evaluation of patient's response to treatment, examination of patient, obtaining history from patient or surrogate, ordering and performing treatments and interventions, ordering and review of laboratory studies, ordering and review of radiographic studies, pulse oximetry, re-evaluation of patient's condition and review of old charts     ____________________________________________   INITIAL IMPRESSION / MDM / [place] / ED COURSE  As part of my medical decision making, I reviewed the following data within the [facility] notes reviewed and incorporated, Labs reviewed , Old chart reviewed, Radiograph reviewed , Discussed with admitting physician (Dr. Damita Dunnings) and reviewed Notes from prior ED visits   Differential diagnosis includes, but is not limited to, asthma exacerbation, COVID-19, community-acquired pneumonia, nonspecific allergic reaction.   The patient is very familiar with her asthma and says this feels like a typical exacerbation for her.  She is concerned because she had to be admitted the last time this happened, apparently several months ago.  She has not been on steroids for months although she uses a Dulera inhaler along with an albuterol rescue inhaler.  I will give her a total of 3 duo nebs and she very much does not want an IV unless it is absolutely necessary unless she requires hospitalization, so I will give her prednisone 60 mg by mouth.  We agreed to a period of observation to make sure she does not get a rebound after the albuterol wears off.  If she requires additional treatment I will plan for IV placement, magnesium,  etc. patient agrees with the current plan and says she would prefer not to stay in the hospital if it is not necessary.    Clinical Course as of Apr 14 147  Wed Apr 13, 2020  0113 Patient is coughing, shortness of breath, and wheezing is getting worse as time passes since her breathing treatment.  She was asking if she is feeling better and she says that she knows that if she goes home she will be back within  the hour.I have ordered a peripheral IV, basic labs, chest x-ray, magnesium 2 g IV, and another treatment with albuterol 5 mg nebulizer.  At this point I believe she will need admission for frequent nebulizer treatments.  I will await the results of the viral respiratory panel and to the rest of the lab work described previously prior to consulting the hospitalist service.   [CF]  0114 Discussed case by phone with Dr. Para March of the hospitalist service who will admit.   [CF]  0136 SARS Coronavirus 2 by RT PCR: NEGATIVE [CF]  0136 Mild leukocytosis, likely due to asthma exacerbation.  Consulting hospitalist at this time.  WBC(!): 14.9 [CF]  0141 No acute abnormalities on CXR  DG Chest Portable 1 View [CF]  0147 The patient is now saying that she is nauseated so I have ordered Zofran 4 mg IV.  She is also saying that she is having acid reflux I have ordered 30 mL of Maalox by mouth.   [CF]    Clinical Course User Index [CF] Loleta Rose, MD     ____________________________________________  FINAL CLINICAL IMPRESSION(S) / ED DIAGNOSES  Final diagnoses:  Exacerbation of asthma, unspecified asthma severity, unspecified whether persistent  Asthma with status asthmaticus, unspecified asthma severity, unspecified whether persistent     MEDICATIONS GIVEN DURING THIS VISIT:  Medications  ipratropium-albuterol (DUONEB) 0.5-2.5 (3) MG/3ML nebulizer solution (has no administration in time range)  magnesium sulfate IVPB 2 g 50 mL (2 g Intravenous New Bag/Given 04/13/20 0129)  alum & mag  hydroxide-simeth (MAALOX/MYLANTA) 200-200-20 MG/5ML suspension 30 mL (has no administration in time range)  ondansetron (ZOFRAN) injection 4 mg (has no administration in time range)  ipratropium-albuterol (DUONEB) 0.5-2.5 (3) MG/3ML nebulizer solution (3 mLs  Given 04/12/20 2316)  ipratropium-albuterol (DUONEB) 0.5-2.5 (3) MG/3ML nebulizer solution 3 mL (3 mLs Nebulization Given 04/12/20 2323)  ipratropium-albuterol (DUONEB) 0.5-2.5 (3) MG/3ML nebulizer solution 3 mL (3 mLs Nebulization Given 04/12/20 2323)  predniSONE (DELTASONE) tablet 60 mg (60 mg Oral Given 04/12/20 2356)  albuterol (PROVENTIL) (2.5 MG/3ML) 0.083% nebulizer solution 5 mg (5 mg Nebulization Given 04/13/20 0130)     ED Discharge Orders         Ordered    predniSONE (DELTASONE) 10 MG tablet     04/12/20 2321    albuterol (VENTOLIN HFA) 108 (90 Base) MCG/ACT inhaler    Note to Pharmacy: Pharmacy may substitute brand and size for insurance-approved equivalent   04/12/20 2322          *Please note:  Amy [name] was evaluated in Emergency Department on 04/13/2020 for the symptoms described in the history of present illness. She was evaluated in the context of the global COVID-19 pandemic, which necessitated consideration that the patient might be at risk for infection with the SARS-CoV-2 virus that causes COVID-19. Institutional protocols and algorithms that pertain to the evaluation of patients at risk for COVID-19 are in a state of rapid change based on information released by regulatory bodies including the CDC and federal and state organizations. These policies and algorithms were followed during the patient's care in the ED.  Some ED evaluations and interventions may be delayed as a result of limited staffing during the pandemic.*  Note:  This document was prepared using Dragon voice recognition software and may include unintentional dictation errors.   Loleta Rose, MD 04/13/20 [phone]

## 2020-04-12 NOTE — ED Triage Notes (Signed)
Patient c/o asthma attack and has maxed out on rescue inhalers. Patient has labored breathing in a tripod position. Patient has expiratory and inspiratory wheezes. Patient is unable to speak without stopping for a breath. Patient has a cough,.

## 2020-04-12 NOTE — Discharge Instructions (Signed)
We believe that your symptoms are caused today by an exacerbation of your asthma.  Please take the prescribed medications and any medications that you have at home.  Follow up with your doctor as recommended.  If you develop any new or worsening symptoms, including but not limited to fever, persistent vomiting, worsening shortness of breath, or other symptoms that concern you, please return to the Emergency Department immediately.  

## 2020-04-13 ENCOUNTER — Encounter: Payer: Self-pay | Admitting: Internal Medicine

## 2020-04-13 ENCOUNTER — Emergency Department: Payer: Medicaid Other

## 2020-04-13 DIAGNOSIS — Y92239 Unspecified place in hospital as the place of occurrence of the external cause: Secondary | ICD-10-CM | POA: Diagnosis not present

## 2020-04-13 DIAGNOSIS — D72829 Elevated white blood cell count, unspecified: Secondary | ICD-10-CM | POA: Diagnosis not present

## 2020-04-13 DIAGNOSIS — Z20822 Contact with and (suspected) exposure to covid-19: Secondary | ICD-10-CM | POA: Diagnosis present

## 2020-04-13 DIAGNOSIS — Z79899 Other long term (current) drug therapy: Secondary | ICD-10-CM | POA: Diagnosis not present

## 2020-04-13 DIAGNOSIS — T380X5A Adverse effect of glucocorticoids and synthetic analogues, initial encounter: Secondary | ICD-10-CM | POA: Diagnosis not present

## 2020-04-13 DIAGNOSIS — Z833 Family history of diabetes mellitus: Secondary | ICD-10-CM | POA: Diagnosis not present

## 2020-04-13 DIAGNOSIS — Z808 Family history of malignant neoplasm of other organs or systems: Secondary | ICD-10-CM | POA: Diagnosis not present

## 2020-04-13 DIAGNOSIS — R0602 Shortness of breath: Secondary | ICD-10-CM | POA: Diagnosis present

## 2020-04-13 DIAGNOSIS — J45901 Unspecified asthma with (acute) exacerbation: Secondary | ICD-10-CM | POA: Diagnosis not present

## 2020-04-13 DIAGNOSIS — L309 Dermatitis, unspecified: Secondary | ICD-10-CM | POA: Diagnosis present

## 2020-04-13 DIAGNOSIS — J329 Chronic sinusitis, unspecified: Secondary | ICD-10-CM | POA: Diagnosis present

## 2020-04-13 DIAGNOSIS — Z8249 Family history of ischemic heart disease and other diseases of the circulatory system: Secondary | ICD-10-CM | POA: Diagnosis not present

## 2020-04-13 DIAGNOSIS — Z791 Long term (current) use of non-steroidal anti-inflammatories (NSAID): Secondary | ICD-10-CM | POA: Diagnosis not present

## 2020-04-13 DIAGNOSIS — R0902 Hypoxemia: Secondary | ICD-10-CM | POA: Diagnosis present

## 2020-04-13 DIAGNOSIS — J4541 Moderate persistent asthma with (acute) exacerbation: Secondary | ICD-10-CM

## 2020-04-13 DIAGNOSIS — Z888 Allergy status to other drugs, medicaments and biological substances status: Secondary | ICD-10-CM | POA: Diagnosis not present

## 2020-04-13 DIAGNOSIS — J40 Bronchitis, not specified as acute or chronic: Secondary | ICD-10-CM | POA: Diagnosis present

## 2020-04-13 LAB — BASIC METABOLIC PANEL
Anion gap: 11 (ref 5–15)
BUN: 12 mg/dL (ref 6–20)
CO2: 22 mmol/L (ref 22–32)
Calcium: 9.1 mg/dL (ref 8.9–10.3)
Chloride: 110 mmol/L (ref 98–111)
Creatinine, Ser: 0.92 mg/dL (ref 0.44–1.00)
GFR calc Af Amer: >60 mL/min (ref >60)
GFR calc non Af Amer: >60 mL/min (ref >60)
Glucose, Bld: 137 mg/dL — ABNORMAL HIGH (ref 70–99)
Potassium: 3.4 mmol/L — ABNORMAL LOW (ref 3.5–5.1)
Sodium: 143 mmol/L (ref 135–145)

## 2020-04-13 LAB — CBC WITH DIFFERENTIAL/PLATELET
Abs Immature Granulocytes: 0.06 10*3/uL (ref 0.00–0.07)
Basophils Absolute: 0.1 10*3/uL (ref 0.0–0.1)
Basophils Relative: 1 %
Eosinophils Absolute: 0.5 10*3/uL (ref 0.0–0.5)
Eosinophils Relative: 4 %
HCT: 43.6 % (ref 36.0–46.0)
Hemoglobin: 14.5 g/dL (ref 12.0–15.0)
Immature Granulocytes: 0 %
Lymphocytes Relative: 12 %
Lymphs Abs: 1.8 10*3/uL (ref 0.7–4.0)
MCH: 27.7 pg (ref 26.0–34.0)
MCHC: 33.3 g/dL (ref 30.0–36.0)
MCV: 83.4 fL (ref 80.0–100.0)
Monocytes Absolute: 0.8 10*3/uL (ref 0.1–1.0)
Monocytes Relative: 6 %
Neutro Abs: 11.6 10*3/uL — ABNORMAL HIGH (ref 1.7–7.7)
Neutrophils Relative %: 77 %
Platelets: 272 10*3/uL (ref 150–400)
RBC: 5.23 MIL/uL — ABNORMAL HIGH (ref 3.87–5.11)
RDW: 12.7 % (ref 11.5–15.5)
WBC: 14.9 10*3/uL — ABNORMAL HIGH (ref 4.0–10.5)
nRBC: 0 % (ref 0.0–0.2)

## 2020-04-13 LAB — RESPIRATORY PANEL BY RT PCR (FLU A&B, COVID)
Influenza A by PCR: NEGATIVE
Influenza B by PCR: NEGATIVE
SARS Coronavirus 2 by RT PCR: NEGATIVE

## 2020-04-13 MED ORDER — ONDANSETRON HCL 4 MG/2ML IJ SOLN
4.0000 mg | INTRAMUSCULAR | Status: AC
  Administered 2020-04-13: 02:00:00 4 mg via INTRAVENOUS
  Filled 2020-04-13: qty 2

## 2020-04-13 MED ORDER — ALUM & MAG HYDROXIDE-SIMETH 200-200-20 MG/5ML PO SUSP
30.0000 mL | Freq: Once | ORAL | Status: AC
  Administered 2020-04-13: 30 mL via ORAL
  Filled 2020-04-13: qty 30

## 2020-04-13 MED ORDER — ACETAMINOPHEN 325 MG PO TABS
650.0000 mg | ORAL_TABLET | Freq: Four times a day (QID) | ORAL | Status: DC | PRN
  Administered 2020-04-13: 650 mg via ORAL
  Filled 2020-04-13: qty 2

## 2020-04-13 MED ORDER — MONTELUKAST SODIUM 10 MG PO TABS
10.0000 mg | ORAL_TABLET | Freq: Every day | ORAL | Status: DC
  Administered 2020-04-13: 10 mg via ORAL
  Filled 2020-04-13: qty 1

## 2020-04-13 MED ORDER — HYDROXYZINE HCL 25 MG PO TABS
25.0000 mg | ORAL_TABLET | Freq: Four times a day (QID) | ORAL | Status: DC | PRN
  Filled 2020-04-13: qty 1

## 2020-04-13 MED ORDER — PREDNISONE 20 MG PO TABS: 40.0000 mg | ORAL_TABLET | Freq: Every day | ORAL | Status: DC

## 2020-04-13 MED ORDER — ALBUTEROL SULFATE (2.5 MG/3ML) 0.083% IN NEBU
5.0000 mg | INHALATION_SOLUTION | Freq: Once | RESPIRATORY_TRACT | Status: AC
  Administered 2020-04-13: 02:00:00 5 mg via RESPIRATORY_TRACT
  Filled 2020-04-13: qty 6

## 2020-04-13 MED ORDER — SODIUM CHLORIDE 0.9 % IV SOLN: INTRAVENOUS | Status: DC

## 2020-04-13 MED ORDER — BUTALBITAL-APAP-CAFFEINE 50-325-40 MG PO TABS
2.0000 | ORAL_TABLET | Freq: Four times a day (QID) | ORAL | Status: DC | PRN
  Administered 2020-04-13: 23:00:00 2 via ORAL
  Filled 2020-04-13: qty 2

## 2020-04-13 MED ORDER — ENOXAPARIN SODIUM 40 MG/0.4ML ~~LOC~~ SOLN
40.0000 mg | SUBCUTANEOUS | Status: DC
  Filled 2020-04-13: qty 0.4

## 2020-04-13 MED ORDER — MOMETASONE FURO-FORMOTEROL FUM 100-5 MCG/ACT IN AERO
2.0000 | INHALATION_SPRAY | Freq: Two times a day (BID) | RESPIRATORY_TRACT | Status: DC
  Administered 2020-04-13 – 2020-04-14 (×3): 2 via RESPIRATORY_TRACT
  Filled 2020-04-13: qty 8.8

## 2020-04-13 MED ORDER — IPRATROPIUM-ALBUTEROL 0.5-2.5 (3) MG/3ML IN SOLN
3.0000 mL | Freq: Four times a day (QID) | RESPIRATORY_TRACT | Status: DC
  Administered 2020-04-13 – 2020-04-14 (×5): 3 mL via RESPIRATORY_TRACT
  Filled 2020-04-13 (×5): qty 3

## 2020-04-13 MED ORDER — METHYLPREDNISOLONE SODIUM SUCC 40 MG IJ SOLR
40.0000 mg | Freq: Four times a day (QID) | INTRAMUSCULAR | Status: AC
  Administered 2020-04-13 (×4): 40 mg via INTRAVENOUS
  Filled 2020-04-13 (×4): qty 1

## 2020-04-13 MED ORDER — ALBUTEROL SULFATE (2.5 MG/3ML) 0.083% IN NEBU: 2.5000 mg | INHALATION_SOLUTION | RESPIRATORY_TRACT | Status: DC | PRN

## 2020-04-13 MED ORDER — MAGNESIUM SULFATE 2 GM/50ML IV SOLN
2.0000 g | Freq: Once | INTRAVENOUS | Status: AC
  Administered 2020-04-13: 01:00:00 2 g via INTRAVENOUS
  Filled 2020-04-13: qty 50

## 2020-04-13 MED ORDER — AZITHROMYCIN 500 MG PO TABS
500.0000 mg | ORAL_TABLET | Freq: Every day | ORAL | Status: DC
  Administered 2020-04-13 – 2020-04-14 (×2): 500 mg via ORAL
  Filled 2020-04-13 (×2): qty 1

## 2020-04-13 NOTE — Progress Notes (Signed)
OT Screen Note  Patient Details Name: Amy [name] MRN: [id number] DOB: [birth year]   Cancelled Treatment:    Reason Eval/Treat Not Completed: OT screened, no needs identified, will sign off. Thank you for the OT consult. Order received and chart reviewed. Pt seen to discuss functional status. Pt reports feeling at or near baseline level of functional independence to perform ADL management. She endorses being up ad lib in room to use the bathroom without difficulty. She denies need for assistance with dressing, toileting, and grooming. No acute OT needs identified will sign off at this time.   Rockney Ghee, M.S., OTR/L Ascom: [phone] 04/13/20, 12:33 PM

## 2020-04-13 NOTE — Progress Notes (Signed)
1        PROGRESS NOTE    Patient: Amy [name]                            PCP: Soyla Dryer, PA-C                    DOB: [birth year]            DOA: 04/12/2020 [id number]             DOS: 04/13/2020, 11:19 AM   LOS: 0 days   Date of Service: The patient was seen and examined on 04/13/2020  Subjective:   The patient was seen and examined this morning, stable sitting in bed.  Audibly wheezing, with minimal exertion developed shortness of breath,... To self continue to improve  Brief Narrative:   Amy [name] is a 24 y.o. female with medical history significant for moderate persistent asthma and needs atopic eczema who presents to the emergency room with wheezing which she was treating with her inhaler until it ran out.  She continued to wheeze, with shortness of breath, hypoxia.   Assessment & Plan:   Active Problems:   Acute exacerbation of moderate persistent extrinsic asthma   Asthma exacerbation  Acute exacerbation of moderate persistent extrinsic asthma -Patient will remain admitted under observation -Continue DuoNeb bronchodilator treatment -Continue IV Solu-Medrol with transition to p.o. prednisone -We will continue her home medication of Dulera -Continue O2 supplement as needed   Bronchitis/sinusitis -Initiate the patient on empiric antibiotic of azithromycin  Leukocytosis -Signs of pneumonia -Dissipating leukocytosis due to steroids -Continue antibiotics above   Eczema -Topical lotions, recommended patient to follow-up with dermatology as an outpatient -As needed Atarax   Nutritional status:            Cultures; Try to obtain sputum culture  Antimicrobials: Empiric antibiotics to p.o. azithromycin --------------------------------------------------------------------------------------------------------------------------------  DVT prophylaxis: SCD/Compression stockings and Lovenox SQ Code Status:   Code Status:  Full Code Family Communication: No family member present at bedside- attempt will be made to update daily The above findings and plan of care has been discussed with patient (and family )  in detail,  they expressed understanding and agreement of above. -Advance care planning has been discussed.   Admission status:   Status is: Observation  The patient will require care spanning > 2 midnights and should be moved to inpatient because: Patient remained hypoxic with minimal exertion, continues to have audible wheezing with shortness of breath and respite distress.  Dispo: The patient is from: Home              Anticipated d/c is to: Home              Anticipated d/c date is: 1 day              Patient currently is not medically stable to d/c.  At this time due to mild hypoxia, persistent  shortness of breath and wheezing        Consultants: None  Procedures:   No admission procedures for hospital encounter.     Antimicrobials:  Anti-infectives (From admission, onward)   Start     Dose/Rate Route Frequency Ordered Stop   04/13/20 1000  azithromycin (ZITHROMAX) tablet 500 mg     500 mg Oral Daily 04/13/20 0850 04/18/20 0959       Medication:  . azithromycin  500 mg Oral Daily  .  enoxaparin (LOVENOX) injection  40 mg Subcutaneous Q24H  . ipratropium-albuterol  3 mL Nebulization Q6H  . methylPREDNISolone (SOLU-MEDROL) injection  40 mg Intravenous Q6H   Followed by  . [START ON 04/14/2020] predniSONE  40 mg Oral Q breakfast  . mometasone-formoterol  2 puff Inhalation BID  . montelukast  10 mg Oral QHS    albuterol, hydrOXYzine   Objective:   Vitals:   04/13/20 0625 04/13/20 0823 04/13/20 0916 04/13/20 1000  BP: 107/78 (!) 103/55 124/73   Pulse: (!) 114  (!) 112   Resp: 18 18 16    Temp:  (!) 97.5 F (36.4 C) 98 F (36.7 C)   TempSrc:  Oral Oral   SpO2: 96%  95% 97%  Weight:      Height:       No intake or output data in the 24 hours ending 04/13/20  1119 Filed Weights   04/12/20 2240  Weight: 81.6 kg     Examination:   Physical Exam  Constitution:  Alert, cooperative, no distress,  Appears calm and comfortable  Psychiatric: Normal and stable mood and affect, cognition intact,   HEENT: Normocephalic, PERRL, otherwise with in Normal limits  Chest:Chest symmetric Cardio vascular:  S1/S2, RRR, No murmure, No Rubs or Gallops  pulmonary: Clear to auscultation bilaterally, respirations unlabored, diffuse audible wheezes / crackles, diffuse rhonchi Abdomen: Soft, non-tender, non-distended, bowel sounds,no masses, no organomegaly Muscular skeletal: Limited exam - in bed, able to move all 4 extremities, Normal strength,  Neuro: CNII-XII intact. , normal motor and sensation, reflexes intact  Extremities: No pitting edema lower extremities, +2 pulses  Skin: Dry patches in upper lower extremity noted, warm to touch, diffuse petechial rashes, No open wounds Wounds: Negative for any open wounds               LABs:  CBC Latest Ref Rng & Units 04/13/2020 03/11/2020 01/04/2019  WBC 4.0 - 10.5 K/uL 14.9(H) 10.6(H) 11.3(H)  Hemoglobin 12.0 - 15.0 g/dL 03/05/2019 07.3 71.0  Hematocrit 36.0 - 46.0 % 43.6 43.4 44.2  Platelets 150 - 400 K/uL 272 230 215   CMP Latest Ref Rng & Units 04/13/2020 03/11/2020 01/04/2019  Glucose 70 - 99 mg/dL 03/05/2019) 948(N) 462(V)  BUN 6 - 20 mg/dL 12 17 15   Creatinine 0.44 - 1.00 mg/dL 035(K 0.93  Sodium 135 - 145 mmol/L 143 141 139  Potassium 3.5 - 5.1 mmol/L 3.4(L) 3.9 3.8  Chloride 98 - 111 mmol/L 110 109 110  CO2 22 - 32 mmol/L 22 21(L) 23  Calcium 8.9 - 10.3 mg/dL 9.1 9.2 8.18)  Total Protein 6.5 - 8.1 g/dL - 6.9 7.1  Total Bilirubin 0.3 - 1.2 mg/dL - 0.9 0.4  Alkaline Phos 38 - 126 U/L - 115 94  AST 15 - 41 U/L - 30 21  ALT 0 - 44 U/L - 33 24        SIGNED: 2.99, MD, FACP, FHM. [facility],  Pager [phone] (please [email or web address] to page/text)  If 7PM-7AM, please contact  night-coverage [email or web address] [facility] 04/13/2020, 11:19 AM

## 2020-04-13 NOTE — Progress Notes (Signed)
PT Cancellation Note  Patient Details Name: Amy [name] MRN: [id number] DOB: Dec 12, 1996   Cancelled Treatment:    Reason Eval/Treat Not Completed: Other (comment);PT screened, no needs identified, will sign off. PT spoke with patient, at baseline pt is independent, no AD and no concerns about balance/ambulation. Pt stated she has experienced exacerbations in the past and normally can manage but this exacerbation is more significant, and she also has a sinus infection. Discussed discharge options and PT POC, pt informed she may benefit from an outpatient PT evaluation to assess activity tolerance/endurance, but she denies any acute PT needs. Pt was experiencing some increased wheezing, politely requested PT to inform RN.    Olga Coaster PT, DPT 8:33 AM,04/13/20

## 2020-04-13 NOTE — ED Notes (Signed)
Pt walked too bathroom.

## 2020-04-13 NOTE — ED Notes (Signed)
Pts IV will not pull blood back and pt is refusing stick for blood draw at this time.

## 2020-04-13 NOTE — ED Notes (Signed)
Pts oxygen saturation dropped to 90% on RA. Pt denies feeling SOB but is sleeping intermittently. PT placed on 2L via [place] at this time.

## 2020-04-13 NOTE — Progress Notes (Signed)
Nutrition Brief Note  24 y/o female admitted with acute exacerbation of moderate persistent extrinsic asthma  Spoke with pt today. Pt reports good appetite and oral intake pta and today. Pt denies any recent weight changes.   Wt Readings from Last 15 Encounters:  04/12/20 81.6 kg  03/11/20 83.5 kg  03/09/20 86.1 kg  12/20/19 87.1 kg  10/17/19 74.4 kg  05/16/19 74.4 kg  02/25/19 80.2 kg  11/22/18 70.8 kg  11/04/18 70.8 kg  03/02/18 63.5 kg  01/26/18 74.1 kg  01/04/18 74.4 kg    Body mass index is 30.9 kg/m. Patient meets criteria for obesity based on current BMI.  Current diet order is regular, patient is consuming approximately 100% of meals at this time. Labs and medications reviewed.   No nutrition interventions warranted at this time. If nutrition issues arise, please consult RD.   Betsey Holiday MS, RD, LDN Please refer to [facility] for RD and/or RD on-call/weekend/after hours pager

## 2020-04-13 NOTE — H&P (Signed)
History and Physical    Amy [name] UDJ:[id number] DOB: Nov 21, 1996 DOA: 04/12/2020  PCP: Jacquelin Hawking, PA-C   Patient coming from: Home I have personally briefly reviewed patient's old medical records in [facility]  Chief Complaint: Wheezing  HPI: Amy [name] is a 24 y.o. female with medical history significant for moderate persistent asthma and needs atopic eczema who presents to the emergency room with wheezing which she was treating with her inhaler until it ran out.  She denies chest pain, no cough fever or chills.  ED Course: Vitals in the emergency room were within normal limits except for tachycardia of 111.  Blood work showed WBC of 15,000.  Covid and flu test negative.  Chest x-ray with no acute disease.  Patient was treated with DuoNeb as well as IV Solu-Medrol and improvement was only temporarily so hospitalist consulted for admission  Review of Systems: As per HPI otherwise 10 point review of systems negative.    Past Medical History:  Diagnosis Date  . Asthma   . Migraines   . Seasonal allergies     Past Surgical History:  Procedure Laterality Date  . TONSILLECTOMY    . WISDOM TOOTH EXTRACTION       reports that she has never smoked. She has never used smokeless tobacco. She reports current alcohol use. She reports that she does not use drugs.  Allergies  Allergen Reactions  . Cough Syrup [Guaifenesin] Nausea And Vomiting  . Other Nausea And Vomiting    Any coated medications    Family History  Problem Relation Age of Onset  . Diabetes Mother   . Hypertension Mother   . Diabetes Father   . Cancer Father        basal cell cancer  . Cancer Maternal Uncle        brain     Prior to Admission medications   Medication Sig Start Date End Date Taking? Authorizing Provider  albuterol (VENTOLIN HFA) 108 (90 Base) MCG/ACT inhaler Inhale 2-4 puffs by mouth every 4 hours as needed for wheezing, cough, and/or shortness of  breath 04/12/20   Loleta Rose, MD  DULERA 100-5 MCG/ACT AERO INHALE 2 PUFFS BY MOUTH TWICE DAILY. RINSE MOUTH AFTER EACH USE Patient taking differently: Inhale 2 puffs into the lungs 2 (two) times daily.  11/30/19   Jacquelin Hawking, PA-C  hydrOXYzine (ATARAX/VISTARIL) 25 MG tablet Take 1 tablet (25 mg total) by mouth every 6 (six) hours as needed for itching. 03/11/20   Tommi Rumps, PA-C  meloxicam (MOBIC) 15 MG tablet TAKE 1 Tablet BY MOUTH ONCE DAILY AS NEEDED FOR PAIN Patient taking differently: Take 15 mg by mouth daily as needed for pain.  11/30/19   Jacquelin Hawking, PA-C  mometasone (ELOCON) 0.1 % cream Apply to affected area once a day 03/11/20 03/11/21  Bridget Hartshorn L, PA-C  montelukast (SINGULAIR) 10 MG tablet TAKE 1 Tablet BY MOUTH ONCE EVERY NIGHT AT BEDTIME Patient taking differently: Take 10 mg by mouth at bedtime.  11/30/19   Jacquelin Hawking, PA-C  predniSONE (DELTASONE) 10 MG tablet Take 6 tabs (60 mg) PO x 3 days, then take 4 tabs (40 mg) PO x 3 days, then take 2 tabs (20 mg) PO x 3 days, then take 1 tab (10 mg) PO x 3 days, then take 1/2 tab (5 mg) PO x 4 days. 04/12/20   Loleta Rose, MD    Physical Exam: Vitals:   04/12/20 2304 04/12/20 2356 04/13/20 0000 04/13/20  0030  BP:  (!) 142/78 128/83 129/78  Pulse:  (!) 111 (!) 101 (!) 104  Resp:      Temp: 98.1 F (36.7 C)     TempSrc: Oral     SpO2:  99% 96% 98%  Weight:      Height:         Vitals:   04/12/20 2304 04/12/20 2356 04/13/20 0000 04/13/20 0030  BP:  (!) 142/78 128/83 129/78  Pulse:  (!) 111 (!) 101 (!) 104  Resp:      Temp: 98.1 F (36.7 C)     TempSrc: Oral     SpO2:  99% 96% 98%  Weight:      Height:        Constitutional: Alert and awake, oriented x3, not in any acute distress. Eyes: PERLA, EOMI, irises appear normal, anicteric sclera,  ENMT: external ears and nose appear normal, normal hearing             Lips appears normal, oropharynx mucosa, tongue, posterior pharynx appear normal   Neck: neck appears normal, no masses, normal ROM, no thyromegaly, no JVD  CVS: S1-S2 clear, no murmur rubs or gallops,  , no carotid bruits, pedal pulses palpable, No LE edema Respiratory: Scattered wheezing, rales or rhonchi. Respiratory effort normal. No accessory muscle use.  Abdomen: soft nontender, nondistended, normal bowel sounds, no hepatosplenomegaly, no hernias Musculoskeletal: : no cyanosis, clubbing , no contractures or atrophy Neuro: Cranial nerves II-XII intact, sensation, reflexes normal, strength Psych: judgement and insight appear normal, stable mood and affect,  Skin: no rashes or lesions or ulcers, no induration or nodules   Labs on Admission: I have personally reviewed following labs and imaging studies  CBC: Recent Labs  Lab 04/13/20 0123  WBC 14.9*  NEUTROABS 11.6*  HGB 14.5  HCT 43.6  MCV 83.4  PLT 638   Basic Metabolic Panel: Recent Labs  Lab 04/13/20 0123  NA 143  K 3.4*  CL 110  CO2 22  GLUCOSE 137*  BUN 12  CREATININE 0.92  CALCIUM 9.1   GFR: Estimated Creatinine Clearance: 97.5 mL/min (by C-G formula based on SCr of 0.92 mg/dL). Liver Function Tests: No results for input(s): AST, ALT, ALKPHOS, BILITOT, PROT, ALBUMIN in the last 168 hours. No results for input(s): LIPASE, AMYLASE in the last 168 hours. No results for input(s): AMMONIA in the last 168 hours. Coagulation Profile: No results for input(s): INR, PROTIME in the last 168 hours. Cardiac Enzymes: No results for input(s): CKTOTAL, CKMB, CKMBINDEX, TROPONINI in the last 168 hours. BNP (last 3 results) No results for input(s): PROBNP in the last 8760 hours. HbA1C: No results for input(s): HGBA1C in the last 72 hours. CBG: No results for input(s): GLUCAP in the last 168 hours. Lipid Profile: No results for input(s): CHOL, HDL, LDLCALC, TRIG, CHOLHDL, LDLDIRECT in the last 72 hours. Thyroid Function Tests: No results for input(s): TSH, T4TOTAL, FREET4, T3FREE, THYROIDAB in the  last 72 hours. Anemia Panel: No results for input(s): VITAMINB12, FOLATE, FERRITIN, TIBC, IRON, RETICCTPCT in the last 72 hours. Urine analysis:    Component Value Date/Time   COLORURINE AMBER (A) 01/04/2018 1530   APPEARANCEUR CLOUDY (A) 01/04/2018 1530   LABSPEC 1.034 (H) 01/04/2018 1530   PHURINE 5.0 01/04/2018 1530   GLUCOSEU NEGATIVE 01/04/2018 1530   HGBUR NEGATIVE 01/04/2018 1530   BILIRUBINUR NEGATIVE 01/04/2018 1530   KETONESUR NEGATIVE 01/04/2018 1530   PROTEINUR 30 (A) 01/04/2018 1530   NITRITE NEGATIVE 01/04/2018 1530  LEUKOCYTESUR SMALL (A) 01/04/2018 1530    Radiological Exams on Admission: DG Chest Portable 1 View  Result Date: 04/13/2020 CLINICAL DATA:  Cough, shortness of breath EXAM: PORTABLE CHEST 1 VIEW COMPARISON:  12/20/2019 FINDINGS: Heart and mediastinal contours are within normal limits. No focal opacities or effusions. No acute bony abnormality. IMPRESSION: No active disease. Electronically Signed   By: Charlett Nose M.D.   On: 04/13/2020 01:34    EKG: Independently reviewed.   Assessment/Plan Active Problems:   Acute exacerbation of moderate persistent extrinsic asthma -Scheduled and as needed nebulized bronchodilator treatment -Continue home Dulera -Solu-Medrol -Transitional care consult.  Patient states she is unable to afford inhalers    DVT prophylaxis: Lovenox  Code Status: full code  Family Communication:  none  Disposition Plan: Back to previous home environment Consults called: none  Status: Observation    Andris Baumann MD [facility]     04/13/2020, 1:51 AM

## 2020-04-14 ENCOUNTER — Telehealth: Payer: Self-pay | Admitting: Pharmacy Technician

## 2020-04-14 DIAGNOSIS — R0602 Shortness of breath: Secondary | ICD-10-CM

## 2020-04-14 LAB — HIV ANTIBODY (ROUTINE TESTING W REFLEX): HIV Screen 4th Generation wRfx: NONREACTIVE

## 2020-04-14 MED ORDER — IPRATROPIUM-ALBUTEROL 0.5-2.5 (3) MG/3ML IN SOLN: 3.0000 mL | Freq: Four times a day (QID) | RESPIRATORY_TRACT | 5 refills | Status: DC

## 2020-04-14 MED ORDER — IPRATROPIUM-ALBUTEROL 0.5-2.5 (3) MG/3ML IN SOLN: 3.0000 mL | Freq: Four times a day (QID) | RESPIRATORY_TRACT | 3 refills | Status: DC

## 2020-04-14 MED ORDER — ALUM & MAG HYDROXIDE-SIMETH 200-200-20 MG/5ML PO SUSP
30.0000 mL | ORAL | Status: DC | PRN
  Administered 2020-04-14: 30 mL via ORAL
  Filled 2020-04-14: qty 30

## 2020-04-14 MED ORDER — LORATADINE 10 MG PO TABS: 10.0000 mg | ORAL_TABLET | Freq: Every day | ORAL | 5 refills | Status: DC

## 2020-04-14 MED ORDER — METHYLPREDNISOLONE 4 MG PO TABS: 4.0000 mg | ORAL_TABLET | Freq: Every day | ORAL | 0 refills | Status: DC

## 2020-04-14 MED ORDER — LORATADINE 10 MG PO TABS: 10.0000 mg | ORAL_TABLET | Freq: Every day | ORAL | 2 refills | Status: DC

## 2020-04-14 MED ORDER — PREDNISONE 10 MG PO TABS: ORAL_TABLET | ORAL | 1 refills | Status: DC

## 2020-04-14 MED ORDER — MONTELUKAST SODIUM 10 MG PO TABS: 10.0000 mg | ORAL_TABLET | Freq: Every day | ORAL | 3 refills | Status: DC

## 2020-04-14 MED ORDER — MONTELUKAST SODIUM 10 MG PO TABS: 10.0000 mg | ORAL_TABLET | Freq: Every day | ORAL | 1 refills | Status: DC

## 2020-04-14 MED ORDER — PREDNISONE 50 MG PO TABS
60.0000 mg | ORAL_TABLET | Freq: Every day | ORAL | Status: DC
  Administered 2020-04-14: 60 mg via ORAL
  Filled 2020-04-14: qty 1

## 2020-04-14 MED ORDER — AZITHROMYCIN 500 MG PO TABS: 500.0000 mg | ORAL_TABLET | Freq: Every day | ORAL | 0 refills | Status: DC

## 2020-04-14 MED ORDER — ALBUTEROL SULFATE HFA 108 (90 BASE) MCG/ACT IN AERS: INHALATION_SPRAY | RESPIRATORY_TRACT | 0 refills | Status: DC

## 2020-04-14 MED ORDER — DULERA 100-5 MCG/ACT IN AERO: INHALATION_SPRAY | RESPIRATORY_TRACT | 1 refills | Status: DC

## 2020-04-14 MED ORDER — AZITHROMYCIN 500 MG PO TABS: 500.0000 mg | ORAL_TABLET | Freq: Every day | ORAL | 0 refills | Status: AC

## 2020-04-14 NOTE — TOC Initial Note (Signed)
Transition of Care [facility]) - Initial/Assessment Note    Patient Details  Name: Amy [name] MRN: [id number] Date of Birth: Feb 13, 1996  [facility] CM/SW Contact:    Allayne Butcher, RN Phone Number: 04/14/2020, 11:12 AM  Clinical Narrative:                 RNCM introduced self and role in discharge planning, patient verbalizes understanding.  Patient admitted for asthma exacerbation.  Patient reports that she lives with her fiance and 2 other roommates in an apartment in [place].  Patient reports that she just recently moved here from [place].  Patient is looking for work but does not currently have a job.  Patient also needs a primary care provider and medication assistance.   RNCM provided patient with [facility] health services in [place].  Referral has been sent to [facility], application for [facility] given to patient.  Patient will pick up her Duoneb solution from [facility].  [employer] will provide a nebulizer machine under charity care.   Patient has a car and drives.  Fiance will be picking patient up at discharge.  Plan is to discharge home today.     Expected Discharge Plan: Home/Self Care Barriers to Discharge: Barriers Resolved   Patient Goals and CMS Choice Patient states their goals for this hospitalization and ongoing recovery are:: Wants to find a primary care provider in [facility]      Expected Discharge Plan and Services Expected Discharge Plan: Home/Self Care   Discharge Planning Services: CM Consult, Medication Assistance, Indigent Health Clinic     Expected Discharge Date: 04/14/20               DME Arranged: Nebulizer machine DME Agency: [facility] Date DME Agency Contacted: 04/14/20 Time DME Agency Contacted: [phone] Representative spoke with at DME Agency: Mitchell Heir            Prior Living Arrangements/Services   Lives  with:: Roommate, Significant Other Patient language and need for interpreter reviewed:: Yes Do you feel safe going back to the place where you live?: Yes      Need for Family Participation in Patient Care: Yes (Comment)(asthma) Care giver support system in place?: Yes (comment)(Boyfriend)   Criminal Activity/Legal Involvement Pertinent to Current Situation/Hospitalization: No - Comment as needed  Activities of Daily Living Home Assistive Devices/Equipment: None ADL Screening (condition at time of admission) Patient's cognitive ability adequate to safely complete daily activities?: Yes Is the patient deaf or have difficulty hearing?: No Does the patient have difficulty seeing, even when wearing glasses/contacts?: No Does the patient have difficulty concentrating, remembering, or making decisions?: No Patient able to express need for assistance with ADLs?: Yes Does the patient have difficulty dressing or bathing?: No Independently performs ADLs?: Yes (appropriate for developmental age) Does the patient have difficulty walking or climbing stairs?: No Weakness of Legs: None Weakness of Arms/Hands: None  Permission Sought/Granted Permission sought to share information with : Case Manager, Magazine features editor Permission granted to share information with : Yes, Verbal Permission Granted     Permission granted to share info w AGENCY: [facility]        Emotional Assessment Appearance:: Appears stated age Attitude/Demeanor/Rapport: Engaged Affect (typically observed): Accepting Orientation: : Oriented to Self, Oriented to Place, Oriented to  Time, Oriented to Situation Alcohol / Substance Use: Not Applicable Psych Involvement: No (comment)  Admission diagnosis:  Asthma  exacerbation [J45.901] Asthma with status asthmaticus, unspecified asthma severity, unspecified whether persistent [J45.902] Exacerbation of asthma, unspecified asthma severity, unspecified whether  persistent [J45.901] Shortness of breath [R06.02] Patient Active Problem List   Diagnosis Date Noted  . 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 Asthma exacerbation 04/13/2020  . Shortness of breath 04/13/2020  . Asthma 02/25/2019  . Eczema 02/25/2019   PCP:  Soyla Dryer, PA-C Pharmacy:   [place], [place] - [address] [place] [address] Phone: [phone] Fax: [phone]  [facility] [phone] - [place], [place] AT [place] [address] [place] [address] Phone: [phone] Fax: [phone]  [facility], [place], [place] [address], [place] [address] Phone: [phone] Fax: [phone]  [place], [place] - [place] [address] Phone: [phone] Fax: [phone]  [facility] [place], [place] [address] [place] [address] Phone: [phone] Fax: [phone]     Social Determinants of Health (SDOH) Interventions    Readmission Risk Interventions No flowsheet data found.

## 2020-04-14 NOTE — Telephone Encounter (Signed)
Attempted to call patient.  Unable to reach.  Mailing patient a new patient packet to obtain [facility] services.  [facility] must receive requested financial documentation within 30 days in order to determine eligibility and provide medication assistance.  Sherilyn Dacosta Care Manager [facility]

## 2020-04-15 NOTE — Discharge Summary (Signed)
Physician Discharge Summary [facility]    Patient: Amy [name]                   Admit date: 04/12/2020   DOB: June 20, 1996             Discharge date:04/15/2020/7:06 PM WEX:[id number]                          PCP: Jacquelin Hawking, PA-C  Disposition: Home  Recommendations for Outpatient Follow-up:   . 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  Patient had an asthma exacerbation at home which did not respond to her own inhalers used until she ran out.  In ED patient was mildly hypoxic shortness of breath with audible wheezing.   Subsequently patient was admitted for acute asthma exacerbation-moderate persistent extrinsic asthma-   Admitted for close observation, start on DuoNeb bronchodilator treatments scheduled and as needed, IV steroids Solu-Medrol which was subsequently transitioned to p.o.  Patient was weaned off oxygen maintain her O2 sat greater 90% on room air.  Patient was restarted on her inhalers including Dulera.  Patient was also noted for bronchitis/sinusitis initiated on a short course of azithromycin for 5 days.  Patient was discharged on p.o. tapered prednisone, p.o. antibiotics azithromycin, albuterol Atrovent solution for DuoNeb treatments, rescue inhalers, Singulair, Claritin.  Eczema, continue topical lotion, p.o. prednisone should help, patient instructed to follow with PCP and dermatologist.  Disposition-patient was discharged home in  stable condition.          Discharge Instructions:   Discharge Instructions    Activity as tolerated - No restrictions   Complete by: As directed    Diet - low sodium heart healthy   Complete by: As directed    Discharge instructions   Complete by: As directed    Follow-up with your PCP, allergist, recommending pulmonologist follow-up   Increase activity slowly   Complete by: As directed        Medication List    STOP taking these medications   meloxicam 15 MG tablet Commonly known as: MOBIC     TAKE these medications   albuterol 108 (90 Base) MCG/ACT inhaler Commonly known as: VENTOLIN HFA Inhale 2-4 puffs by mouth every 4 hours as needed for wheezing, cough, and/or shortness of breath What changed:   how much to take  how to take this  when to take this  reasons to take this  additional instructions   azithromycin 500 MG tablet Commonly known as: ZITHROMAX Take 1 tablet (500 mg total) by mouth daily for 4 days.   Dulera 100-5 MCG/ACT Aero Generic drug: mometasone-formoterol INHALE 2 PUFFS BY MOUTH TWICE DAILY. RINSE MOUTH AFTER EACH USE What changed: See the new instructions.   hydrOXYzine 25 MG tablet Commonly known as: ATARAX/VISTARIL Take 1 tablet (25 mg total) by mouth every 6 (six) hours as needed for itching.   ipratropium-albuterol 0.5-2.5 (3) MG/3ML Soln Commonly known as: DUONEB Take  3 mLs by nebulization every 6 (six) hours.   loratadine 10 MG tablet Commonly known as: Claritin Take 1 tablet (10 mg total) by mouth daily.   mometasone 0.1 % cream Commonly known as: Elocon Apply to affected area once a day   montelukast 10 MG tablet Commonly known as: SINGULAIR Take 1 tablet (10 mg total) by mouth at bedtime. What changed: See the new instructions.   predniSONE 10 MG tablet Commonly known as: DELTASONE Take 6 tabs (60 mg) PO x 3 days, then take 4 tabs (40 mg) PO x 3 days, then take 2 tabs (20 mg) PO x 3 days, then take 1 tab (10  mg) PO x 3 days, then take 1/2 tab (5 mg) PO x 4 days. What changed: additional instructions      Follow-up Information    Schedule an appointment as soon as possible for a visit  with [facility].   Specialty: Primary Care Why: for a follow up appointment, to establish a primary care doctor Contact information: [place] [phone]       Call the toll-free number listed later in this paperwork [phone] they can help set you up with a primary care doctor near you.          Allergies  Allergen Reactions  . Cough Syrup [Guaifenesin] Nausea And Vomiting  . Other Nausea And Vomiting    Any coated medications     Procedures /Studies:   DG Chest Portable 1 View  Result Date: 04/13/2020 CLINICAL DATA:  Cough, shortness of breath EXAM: PORTABLE CHEST 1 VIEW COMPARISON:  12/20/2019 FINDINGS: Heart and mediastinal contours are within normal limits. No focal opacities or effusions. No acute bony abnormality. IMPRESSION: No active disease. Electronically Signed   By: Rolm Baptise M.D.   On: 04/13/2020 01:34     Subjective:   Patient was seen and examined 04/15/2020, 7:06 PM Patient stable today. No acute distress.  No issues overnight Stable for discharge.  Discharge Exam:    Vitals:   04/14/20 0008 04/14/20 0009 04/14/20 0215 04/14/20 0818  BP: (!) 119/59   (!) 121/56  Pulse: (!) 109 (!) 110  (!) 108  Resp: 18   17  Temp: 98 F (36.7 C)   98.2 F (36.8 C)  TempSrc: Oral   Oral  SpO2: 93% 95% 97% 96%  Weight:      Height:        General: Pt lying comfortably in bed & appears in no obvious distress. Cardiovascular: S1 & S2 heard, RRR, S1/S2 +. No murmurs, rubs, gallops or clicks. No JVD or pedal edema. Respiratory: Clear to auscultation without wheezing, rhonchi or crackles. No increased work of breathing. Abdominal:  Non-distended, non-tender & soft. No  organomegaly or masses appreciated. Normal bowel sounds heard. CNS: Alert and oriented. No focal deficits. Extremities: no edema, no cyanosis    The results of significant diagnostics from this hospitalization (including imaging, microbiology, ancillary and laboratory) are listed below for reference.      Microbiology:   Recent Results (from the past 240 hour(s))  Respiratory Panel by RT PCR (Flu A&B, Covid) - Nasopharyngeal Swab     Status: None   Collection Time: 04/12/20 11:57 PM   Specimen: Nasopharyngeal Swab  Result Value Ref Range Status   SARS Coronavirus 2 by RT PCR NEGATIVE NEGATIVE Final    Comment: (NOTE) SARS-CoV-2 target nucleic acids are NOT DETECTED. The  SARS-CoV-2 RNA is generally detectable in upper respiratoy specimens during the acute phase of infection. The lowest concentration of SARS-CoV-2 viral copies this assay can detect is 131 copies/mL. A negative result does not preclude SARS-Cov-2 infection and should not be used as the sole basis for treatment or other patient management decisions. A negative result may occur with  improper specimen collection/handling, submission of specimen other than nasopharyngeal swab, presence of viral mutation(s) within the areas targeted by this assay, and inadequate number of viral copies (<131 copies/mL). A negative result must be combined with clinical observations, patient history, and epidemiological information. The expected result is Negative. Fact Sheet for Patients:  https://www.moore.com/ Fact Sheet for Healthcare Providers:  https://www.young.biz/ This test is not yet ap proved or cleared by the [place] FDA and  has been authorized for detection and/or diagnosis of SARS-CoV-2 by FDA under an Emergency Use Authorization (EUA). This EUA will remain  in effect (meaning this test can be used) for the duration of the COVID-19 declaration under Section 564(b)(1) of the Act,  21 U.S.C. section 360bbb-3(b)(1), unless the authorization is terminated or revoked sooner.    Influenza A by PCR NEGATIVE NEGATIVE Final   Influenza B by PCR NEGATIVE NEGATIVE Final    Comment: (NOTE) The Xpert Xpress SARS-CoV-2/FLU/RSV assay is intended as an aid in  the diagnosis of influenza from Nasopharyngeal swab specimens and  should not be used as a sole basis for treatment. Nasal washings and  aspirates are unacceptable for Xpert Xpress SARS-CoV-2/FLU/RSV  testing. Fact Sheet for Patients: https://www.moore.com/ Fact Sheet for Healthcare Providers: https://www.young.biz/ This test is not yet approved or cleared by the [place] FDA and  has been authorized for detection and/or diagnosis of SARS-CoV-2 by  FDA under an Emergency Use Authorization (EUA). This EUA will remain  in effect (meaning this test can be used) for the duration of the  Covid-19 declaration under Section 564(b)(1) of the Act, 21  U.S.C. section 360bbb-3(b)(1), unless the authorization is  terminated or revoked. Performed at [facility], [address] [place], [place] [address]      Labs:   CBC: Recent Labs  Lab 04/13/20 0123  WBC 14.9*  NEUTROABS 11.6*  HGB 14.5  HCT 43.6  MCV 83.4  PLT 272   Basic Metabolic Panel: Recent Labs  Lab 04/13/20 0123  NA 143  K 3.4*  CL 110  CO2 22  GLUCOSE 137*  BUN 12  CREATININE 0.92  CALCIUM 9.1   Liver Function Tests: No results for input(s): AST, ALT, ALKPHOS, BILITOT, PROT, ALBUMIN in the last 168 hours. BNP (last 3 results) No results for input(s): BNP in the last 8760 hours. Cardiac Enzymes: No results for input(s): CKTOTAL, CKMB, CKMBINDEX, TROPONINI in the last 168 hours. CBG: No results for input(s): GLUCAP in the last 168 hours. Hgb A1c No results for input(s): HGBA1C in the last 72 hours. Lipid Profile No results for input(s): CHOL, HDL, LDLCALC, TRIG, CHOLHDL, LDLDIRECT in the  last 72 hours. Thyroid function studies No results for input(s): TSH, T4TOTAL, T3FREE, THYROIDAB in the last 72 hours.  Invalid input(s): FREET3 Anemia work up No results for input(s): VITAMINB12, FOLATE, FERRITIN, TIBC, IRON, RETICCTPCT in the last 72 hours. Urinalysis    Component Value Date/Time   COLORURINE AMBER (A) 01/04/2018 1530   APPEARANCEUR CLOUDY (A) 01/04/2018 1530   LABSPEC 1.034 (H) 01/04/2018 1530   PHURINE 5.0 01/04/2018 1530   GLUCOSEU NEGATIVE 01/04/2018 1530   HGBUR NEGATIVE 01/04/2018 1530  BILIRUBINUR NEGATIVE 01/04/2018 1530   KETONESUR NEGATIVE 01/04/2018 1530   PROTEINUR 30 (A) 01/04/2018 1530   NITRITE NEGATIVE 01/04/2018 1530   LEUKOCYTESUR SMALL (A) 01/04/2018 1530         Time coordinating discharge: Over 45 minutes  SIGNED: Kendell Bane, MD, FACP, [facility]. [facility],  Pager [phone]  If 7PM-7AM, please contact night-coverage [email or web address] 04/15/2020, 7:06 PM

## 2020-04-25 ENCOUNTER — Ambulatory Visit: Payer: Self-pay | Admitting: Physician Assistant

## 2020-05-10 ENCOUNTER — Encounter: Payer: Self-pay | Admitting: Obstetrics and Gynecology

## 2020-05-11 ENCOUNTER — Ambulatory Visit (INDEPENDENT_AMBULATORY_CARE_PROVIDER_SITE_OTHER): Payer: Medicaid Other | Admitting: Obstetrics and Gynecology

## 2020-05-11 ENCOUNTER — Other Ambulatory Visit: Payer: Self-pay

## 2020-05-11 ENCOUNTER — Encounter: Payer: Self-pay | Admitting: Obstetrics and Gynecology

## 2020-05-11 VITALS — BP 125/86 | HR 105 | Ht 64.0 in | Wt 184.8 lb

## 2020-05-11 DIAGNOSIS — N912 Amenorrhea, unspecified: Secondary | ICD-10-CM

## 2020-05-11 LAB — POCT URINE PREGNANCY: Preg Test, Ur: POSITIVE — AB

## 2020-05-11 NOTE — Progress Notes (Signed)
HPI:      Amy [name] is a 24 y.o. G1P0 who LMP was Patient's last menstrual period was 02/24/2020.  Subjective:   She presents today for pregnancy confirmation.  She states that her last menstrual period was approximately 11 weeks ago but she was not sure if she was pregnant.  She was attempting pregnancy.  She is not taking prenatal vitamins. Of significant note patient has a history of asthma and has been seen in the emergency department and received multiple medications during this pregnancy for an exacerbation of her asthma.    Hx: The following portions of the patient's history were reviewed and updated as appropriate:             She  has a past medical history of Asthma, Migraines, and Seasonal allergies. She does not have any pertinent problems on file. She  has a past surgical history that includes Tonsillectomy and Wisdom tooth extraction. Her family history includes Cancer in her father and maternal uncle; Diabetes in her father and mother; Hypertension in her mother. She  reports that she has never smoked. She has never used smokeless tobacco. She reports current alcohol use. She reports that she does not use drugs. She has a current medication list which includes the following prescription(s): albuterol, hydroxyzine, ipratropium-albuterol, loratadine, mometasone, dulera, and montelukast. She is allergic to cough syrup [guaifenesin] and other.       Review of Systems:  Review of Systems  Constitutional: Denied constitutional symptoms, night sweats, recent illness, fatigue, fever, insomnia and weight loss.  Eyes: Denied eye symptoms, eye pain, photophobia, vision change and visual disturbance.  Ears/Nose/Throat/Neck: Denied ear, nose, throat or neck symptoms, hearing loss, nasal discharge, sinus congestion and sore throat.  Cardiovascular: Denied cardiovascular symptoms, arrhythmia, chest pain/pressure, edema, exercise intolerance, orthopnea and palpitations.   Respiratory: Denied pulmonary symptoms, asthma, pleuritic pain, productive sputum, cough, dyspnea and wheezing.  Gastrointestinal: Denied, gastro-esophageal reflux, melena, nausea and vomiting.  Genitourinary: Denied genitourinary symptoms including symptomatic vaginal discharge, pelvic relaxation issues, and urinary complaints.  Musculoskeletal: Denied musculoskeletal symptoms, stiffness, swelling, muscle weakness and myalgia.  Dermatologic: Denied dermatology symptoms, rash and scar.  Neurologic: Denied neurology symptoms, dizziness, headache, neck pain and syncope.  Psychiatric: Denied psychiatric symptoms, anxiety and depression.  Endocrine: Denied endocrine symptoms including hot flashes and night sweats.   Meds:   Current Outpatient Medications on File Prior to Visit  Medication Sig Dispense Refill  . albuterol (VENTOLIN HFA) 108 (90 Base) MCG/ACT inhaler Inhale 2-4 puffs by mouth every 4 hours as needed for wheezing, cough, and/or shortness of breath 6.7 g 0  . hydrOXYzine (ATARAX/VISTARIL) 25 MG tablet Take 1 tablet (25 mg total) by mouth every 6 (six) hours as needed for itching. 30 tablet 1  . ipratropium-albuterol (DUONEB) 0.5-2.5 (3) MG/3ML SOLN Take 3 mLs by nebulization every 6 (six) hours. 360 mL 5  . loratadine (CLARITIN) 10 MG tablet Take 1 tablet (10 mg total) by mouth daily. 30 tablet 5  . mometasone (ELOCON) 0.1 % cream Apply to affected area once a day 45 g 2  . mometasone-formoterol (DULERA) 100-5 MCG/ACT AERO INHALE 2 PUFFS BY MOUTH TWICE DAILY. RINSE MOUTH AFTER EACH USE 39 g 1  . montelukast (SINGULAIR) 10 MG tablet Take 1 tablet (10 mg total) by mouth at bedtime. 30 tablet 3   No current facility-administered medications on file prior to visit.    Objective:     Vitals:   05/11/20 0909  BP: 125/86  Pulse: (!) 105              Urinary pregnancy test positive  Assessment:    G1P0 Patient Active Problem List   Diagnosis Date Noted  . Acute exacerbation  of moderate persistent extrinsic asthma 04/13/2020  . Asthma exacerbation 04/13/2020  . Shortness of breath 04/13/2020  . Asthma 02/25/2019  . Eczema 02/25/2019     1. Amenorrhea     Estimated gestational age is approximately 11 weeks based on last menstrual period.   Plan:            Prenatal Plan 1.  The patient was given prenatal literature. 2.  She was begun on prenatal vitamins. 3.  A prenatal lab panel was ordered or drawn. 4.  An ultrasound was ordered to better determine an EDC. 5.  A nurse visit was scheduled. 6.  Genetic testing and testing for other inheritable conditions discussed in detail. She will decide in the future whether to have these labs performed. 7.  A general overview of pregnancy testing, visit schedule, ultrasound schedule, and prenatal care was discussed. 8.  COVID and its risks associated with pregnancy, prevention by limiting exposure and use of masks, as well as the risks and benefits of vaccination during pregnancy were discussed in detail.  [facility] policy regarding office and hospital visitation and testing was explained. 9.  Benefits of breast-feeding discussed in detail including both maternal and infant benefits. Ready Set Baby website discussed.   Orders Orders Placed This Encounter  Procedures  . US OB Comp Less 14 Wks  . US OB Transvaginal  . POCT urine pregnancy    No orders of the defined types were placed in this encounter.     F/U  Return in about 3 weeks (around 06/01/2020). I spent 34 minutes involved in the care of this patient preparing to see the patient by obtaining and reviewing her medical history (including labs, imaging tests and prior procedures), documenting clinical information in the electronic health record (EHR), counseling and coordinating care plans, writing and sending prescriptions, ordering tests or procedures and directly communicating with the patient by discussing pertinent items from her history and physical exam as  well as detailing my assessment and plan as noted above so that she has an informed understanding.  All of her questions were answered.  Elonda Husky, M.D. 05/11/2020 9:29 AM

## 2020-05-18 ENCOUNTER — Other Ambulatory Visit: Payer: Self-pay

## 2020-05-18 ENCOUNTER — Ambulatory Visit (INDEPENDENT_AMBULATORY_CARE_PROVIDER_SITE_OTHER): Payer: Medicaid Other

## 2020-05-18 DIAGNOSIS — N912 Amenorrhea, unspecified: Secondary | ICD-10-CM

## 2020-05-18 DIAGNOSIS — Z3A01 Less than 8 weeks gestation of pregnancy: Secondary | ICD-10-CM | POA: Diagnosis not present

## 2020-06-07 ENCOUNTER — Encounter: Payer: Medicaid Other | Admitting: Obstetrics and Gynecology

## 2020-06-15 ENCOUNTER — Telehealth: Payer: Self-pay | Admitting: *Deleted

## 2020-06-15 ENCOUNTER — Telehealth: Payer: Self-pay | Admitting: Women's Health

## 2020-06-15 NOTE — Telephone Encounter (Signed)
Telephoned patient at home number and left message to return call.  

## 2020-06-15 NOTE — Telephone Encounter (Signed)
Patient returned call. Patient states she is able to keep fluids down just not food. Patient is urinating and color of urine is normal. Advised patient to scheduled appointment and if gets worse then to go to ER. Patient voiced understanding.

## 2020-06-15 NOTE — Telephone Encounter (Signed)
Patient called stating that she is pregnant and has not been able to hold anything down. Pt states that she has been losing weight at least a pound a day and she is concern and it is not healthy for the baby. Pt states that sh has been trying to eat club soda cracker but recently has not been able to hold that down either. Please contact pt

## 2020-06-17 ENCOUNTER — Emergency Department (HOSPITAL_COMMUNITY): Payer: Medicaid Other

## 2020-06-17 ENCOUNTER — Encounter (HOSPITAL_COMMUNITY): Payer: Self-pay | Admitting: Internal Medicine

## 2020-06-17 ENCOUNTER — Inpatient Hospital Stay (HOSPITAL_COMMUNITY)
Admission: AD | Admit: 2020-06-17 | Discharge: 2020-06-21 | DRG: 817 | Disposition: A | Discharge status: Home / Self Care | Payer: Medicaid Other | Attending: General Surgery | Admitting: General Surgery

## 2020-06-17 ENCOUNTER — Other Ambulatory Visit: Payer: Self-pay

## 2020-06-17 DIAGNOSIS — Z91048 Other nonmedicinal substance allergy status: Secondary | ICD-10-CM

## 2020-06-17 DIAGNOSIS — K851 Biliary acute pancreatitis without necrosis or infection: Secondary | ICD-10-CM

## 2020-06-17 DIAGNOSIS — K8000 Calculus of gallbladder with acute cholecystitis without obstruction: Secondary | ICD-10-CM | POA: Diagnosis present

## 2020-06-17 DIAGNOSIS — O99511 Diseases of the respiratory system complicating pregnancy, first trimester: Secondary | ICD-10-CM | POA: Diagnosis present

## 2020-06-17 DIAGNOSIS — Z888 Allergy status to other drugs, medicaments and biological substances status: Secondary | ICD-10-CM

## 2020-06-17 DIAGNOSIS — Z3A10 10 weeks gestation of pregnancy: Secondary | ICD-10-CM

## 2020-06-17 DIAGNOSIS — J45909 Unspecified asthma, uncomplicated: Secondary | ICD-10-CM | POA: Diagnosis present

## 2020-06-17 DIAGNOSIS — Z7951 Long term (current) use of inhaled steroids: Secondary | ICD-10-CM | POA: Diagnosis not present

## 2020-06-17 DIAGNOSIS — O26611 Liver and biliary tract disorders in pregnancy, first trimester: Principal | ICD-10-CM | POA: Diagnosis present

## 2020-06-17 DIAGNOSIS — Z20822 Contact with and (suspected) exposure to covid-19: Secondary | ICD-10-CM | POA: Diagnosis present

## 2020-06-17 DIAGNOSIS — Z3491 Encounter for supervision of normal pregnancy, unspecified, first trimester: Secondary | ICD-10-CM

## 2020-06-17 DIAGNOSIS — K859 Acute pancreatitis without necrosis or infection, unspecified: Secondary | ICD-10-CM

## 2020-06-17 DIAGNOSIS — Z79899 Other long term (current) drug therapy: Secondary | ICD-10-CM

## 2020-06-17 DIAGNOSIS — K810 Acute cholecystitis: Secondary | ICD-10-CM

## 2020-06-17 LAB — COMPREHENSIVE METABOLIC PANEL
ALT: 230 U/L — ABNORMAL HIGH (ref 0–44)
AST: 218 U/L — ABNORMAL HIGH (ref 15–41)
Albumin: 3.9 g/dL (ref 3.5–5.0)
Alkaline Phosphatase: 137 U/L — ABNORMAL HIGH (ref 38–126)
Anion gap: 10 (ref 5–15)
BUN: 10 mg/dL (ref 6–20)
CO2: 24 mmol/L (ref 22–32)
Calcium: 9.3 mg/dL (ref 8.9–10.3)
Chloride: 103 mmol/L (ref 98–111)
Creatinine, Ser: 0.56 mg/dL (ref 0.44–1.00)
GFR calc Af Amer: >60 mL/min (ref >60)
GFR calc non Af Amer: >60 mL/min (ref >60)
Glucose, Bld: 121 mg/dL — ABNORMAL HIGH (ref 70–99)
Potassium: 4 mmol/L (ref 3.5–5.1)
Sodium: 137 mmol/L (ref 135–145)
Total Bilirubin: 0.9 mg/dL (ref 0.3–1.2)
Total Protein: 6.9 g/dL (ref 6.5–8.1)

## 2020-06-17 LAB — URINALYSIS, ROUTINE W REFLEX MICROSCOPIC
Glucose, UA: NEGATIVE mg/dL
Hgb urine dipstick: NEGATIVE
Ketones, ur: NEGATIVE mg/dL
Nitrite: NEGATIVE
Protein, ur: 30 mg/dL — AB
Specific Gravity, Urine: 1.026 (ref 1.005–1.030)
Squamous Epithelial / HPF: >50 — ABNORMAL HIGH (ref 0–5)
pH: 5 (ref 5.0–8.0)

## 2020-06-17 LAB — CBC
HCT: 41.3 % (ref 36.0–46.0)
Hemoglobin: 13.8 g/dL (ref 12.0–15.0)
MCH: 28.6 pg (ref 26.0–34.0)
MCHC: 33.4 g/dL (ref 30.0–36.0)
MCV: 85.5 fL (ref 80.0–100.0)
Platelets: 215 10*3/uL (ref 150–400)
RBC: 4.83 MIL/uL (ref 3.87–5.11)
RDW: 13.7 % (ref 11.5–15.5)
WBC: 12.2 10*3/uL — ABNORMAL HIGH (ref 4.0–10.5)
nRBC: 0 % (ref 0.0–0.2)

## 2020-06-17 LAB — POC URINE PREG, ED: Preg Test, Ur: POSITIVE — AB

## 2020-06-17 LAB — SARS CORONAVIRUS 2 BY RT PCR (HOSPITAL ORDER, PERFORMED IN ~~LOC~~ HOSPITAL LAB): SARS Coronavirus 2: NEGATIVE

## 2020-06-17 LAB — LIPASE, BLOOD: Lipase: 371 U/L — ABNORMAL HIGH (ref 11–51)

## 2020-06-17 MED ORDER — MONTELUKAST SODIUM 10 MG PO TABS
10.0000 mg | ORAL_TABLET | Freq: Every day | ORAL | Status: DC
  Administered 2020-06-17 – 2020-06-20 (×4): 10 mg via ORAL
  Filled 2020-06-17 (×4): qty 1

## 2020-06-17 MED ORDER — SODIUM CHLORIDE 0.9 % IV SOLN
2.0000 g | Freq: Once | INTRAVENOUS | Status: AC
  Administered 2020-06-17: 2 g via INTRAVENOUS
  Filled 2020-06-17: qty 20

## 2020-06-17 MED ORDER — IPRATROPIUM-ALBUTEROL 0.5-2.5 (3) MG/3ML IN SOLN
3.0000 mL | Freq: Four times a day (QID) | RESPIRATORY_TRACT | Status: DC
  Administered 2020-06-17 – 2020-06-18 (×4): 3 mL via RESPIRATORY_TRACT
  Filled 2020-06-17 (×3): qty 3

## 2020-06-17 MED ORDER — ONDANSETRON HCL 4 MG/2ML IJ SOLN
4.0000 mg | Freq: Once | INTRAMUSCULAR | Status: AC
  Administered 2020-06-17: 4 mg via INTRAVENOUS
  Filled 2020-06-17: qty 2

## 2020-06-17 MED ORDER — LACTATED RINGERS IV SOLN: INTRAVENOUS | Status: DC

## 2020-06-17 MED ORDER — ONDANSETRON HCL 4 MG/2ML IJ SOLN
4.0000 mg | Freq: Four times a day (QID) | INTRAMUSCULAR | Status: DC | PRN
  Administered 2020-06-18 – 2020-06-21 (×3): 4 mg via INTRAVENOUS
  Filled 2020-06-17 (×3): qty 2

## 2020-06-17 MED ORDER — HEPARIN SODIUM (PORCINE) 5000 UNIT/ML IJ SOLN
5000.0000 [IU] | Freq: Three times a day (TID) | INTRAMUSCULAR | Status: DC
  Administered 2020-06-17: 5000 [IU] via SUBCUTANEOUS
  Filled 2020-06-17 (×3): qty 1

## 2020-06-17 MED ORDER — ONDANSETRON HCL 4 MG PO TABS: 4.0000 mg | ORAL_TABLET | Freq: Four times a day (QID) | ORAL | Status: DC | PRN

## 2020-06-17 MED ORDER — ALBUTEROL SULFATE HFA 108 (90 BASE) MCG/ACT IN AERS
1.0000 | INHALATION_SPRAY | RESPIRATORY_TRACT | Status: DC | PRN
  Filled 2020-06-17: qty 8.5

## 2020-06-17 MED ORDER — SODIUM CHLORIDE 0.9 % IV SOLN
2.0000 g | INTRAVENOUS | Status: DC
  Administered 2020-06-18 – 2020-06-20 (×3): 2 g via INTRAVENOUS
  Filled 2020-06-17 (×3): qty 20

## 2020-06-17 MED ORDER — OXYCODONE HCL 5 MG PO TABS
2.5000 mg | ORAL_TABLET | Freq: Four times a day (QID) | ORAL | Status: DC | PRN
  Administered 2020-06-20 – 2020-06-21 (×3): 2.5 mg via ORAL
  Filled 2020-06-17 (×3): qty 1

## 2020-06-17 MED ORDER — MOMETASONE FURO-FORMOTEROL FUM 100-5 MCG/ACT IN AERO
2.0000 | INHALATION_SPRAY | Freq: Two times a day (BID) | RESPIRATORY_TRACT | Status: DC
  Administered 2020-06-17 – 2020-06-21 (×8): 2 via RESPIRATORY_TRACT
  Filled 2020-06-17: qty 8.8

## 2020-06-17 MED ORDER — COMPLETENATE 29-1 MG PO CHEW
1.0000 | CHEWABLE_TABLET | Freq: Every day | ORAL | Status: DC
  Filled 2020-06-17 (×3): qty 1

## 2020-06-17 MED ORDER — SODIUM CHLORIDE 0.9 % IV BOLUS
1000.0000 mL | Freq: Once | INTRAVENOUS | Status: AC
  Administered 2020-06-17: 1000 mL via INTRAVENOUS

## 2020-06-17 NOTE — ED Provider Notes (Signed)
[facility] EMERGENCY DEPARTMENT Provider Note   CSN: [id number] Arrival date & time: 06/17/20  1402     History Chief Complaint  Patient presents with  . Emesis    Amy [name] is a 24 y.o. female.  24 year old female with complaint of nausea and vomiting with abdominal discomfort, reports [redacted]w[redacted]d G1P0 with prenatal care through [facility]. Reports 6 episode of vomiting today, unable to keep anything down, had a similar episode a few weeks ago and was told to come to the ER if this happened again. Denies fevers, chills, changes in bowel or bladder habits, vaginal bleeding or other concerns.         Past Medical History:  Diagnosis Date  . Asthma   . Migraines   . Seasonal allergies     Patient Active Problem List   Diagnosis Date Noted  . 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 Asthma exacerbation 04/13/2020  . Shortness of breath 04/13/2020  . Asthma 02/25/2019  . Eczema 02/25/2019    Past Surgical History:  Procedure Laterality Date  . TONSILLECTOMY    . WISDOM TOOTH EXTRACTION       OB History    Gravida  1   Para      Term      Preterm      AB      Living        SAB      TAB      Ectopic      Multiple      Live Births              Family History  Problem Relation Age of Onset  . Diabetes Mother   . Hypertension Mother   . Diabetes Father   . Cancer Father        basal cell cancer  . Cancer Maternal Uncle        brain    Social History   Tobacco Use  . Smoking status: Never Smoker  . Smokeless tobacco: Never Used  Vaping Use  . Vaping Use: Never used  Substance Use Topics  . Alcohol use: Yes    Comment: occ  . Drug use: No    Home Medications Prior to Admission medications   Medication Sig Start Date End Date Taking? Authorizing Provider  albuterol (VENTOLIN HFA) 108 (90 Base) MCG/ACT inhaler Inhale 2-4 puffs by mouth every 4 hours as needed for wheezing, cough, and/or  shortness of breath 04/14/20  Yes Shahmehdi, Gemma Payor, MD  Prenatal MV-Min-FA-Omega-3 (PRENATAL GUMMIES/DHA & FA PO) Take 1 each by mouth daily.   Yes [provider]  hydrOXYzine (ATARAX/VISTARIL) 25 MG tablet Take 1 tablet (25 mg total) by mouth every 6 (six) hours as needed for itching. Patient not taking: Reported on 06/17/2020 03/11/20   Tommi Rumps, PA-C  ipratropium-albuterol (DUONEB) 0.5-2.5 (3) MG/3ML SOLN Take 3 mLs by nebulization every 6 (six) hours. 04/14/20   Shahmehdi, Gemma Payor, MD  loratadine (CLARITIN) 10 MG tablet Take 1 tablet (10 mg total) by mouth daily. Patient not taking: Reported on 06/17/2020 04/14/20 04/14/21  Kendell Bane, MD  mometasone (ELOCON) 0.1 % cream Apply to affected area once a day Patient not taking: Reported on 06/17/2020 03/11/20 03/11/21  Tommi Rumps, PA-C  mometasone-formoterol (DULERA) 100-5 MCG/ACT AERO INHALE 2 PUFFS BY MOUTH TWICE DAILY. RINSE MOUTH AFTER EACH USE Patient taking differently: Inhale 2 puffs into the lungs in the morning  and at bedtime. INHALE 2 PUFFS BY MOUTH TWICE DAILY. RINSE MOUTH AFTER EACH USE 04/14/20   Shahmehdi, Valeria Batman, MD  montelukast (SINGULAIR) 10 MG tablet Take 1 tablet (10 mg total) by mouth at bedtime. Patient not taking: Reported on 06/17/2020 04/14/20   Deatra James, MD    Allergies    Cough syrup [guaifenesin] and Other  Review of Systems   Review of Systems  Constitutional: Negative for chills, diaphoresis and fever.  Respiratory: Negative for shortness of breath.        Baseline wheezing, history of asthma  Cardiovascular: Negative for chest pain.  Gastrointestinal: Positive for abdominal pain, nausea and vomiting. Negative for constipation and diarrhea.  Genitourinary: Negative for vaginal bleeding and vaginal discharge.  Musculoskeletal: Negative for back pain.  Skin: Negative for rash and wound.  Neurological: Negative for weakness.  Hematological: Negative for adenopathy.   Psychiatric/Behavioral: Negative for confusion.  All other systems reviewed and are negative.   Physical Exam Updated Vital Signs BP 119/66 (BP Location: Left Arm)   Pulse 77   Temp 97.9 F (36.6 C) (Oral)   Resp 18   Wt 83.5 kg   LMP 03/24/2020   SpO2 100%   BMI 31.58 kg/m   Physical Exam Vitals and nursing note reviewed.  Constitutional:      General: She is not in acute distress.    Appearance: She is well-developed. She is not diaphoretic.  HENT:     Head: Normocephalic and atraumatic.  Cardiovascular:     Rate and Rhythm: Normal rate and regular rhythm.     Pulses: Normal pulses.     Heart sounds: Normal heart sounds.  Pulmonary:     Effort: Pulmonary effort is normal.     Breath sounds: Normal breath sounds.  Abdominal:     Palpations: Abdomen is soft.     Tenderness: There is generalized abdominal tenderness. There is no right CVA tenderness or left CVA tenderness.  Musculoskeletal:     Right lower leg: No edema.     Left lower leg: No edema.  Skin:    General: Skin is warm and dry.  Neurological:     Mental Status: She is alert and oriented to person, place, and time.  Psychiatric:        Behavior: Behavior normal.     ED Results / Procedures / Treatments   Labs (all labs ordered are listed, but only abnormal results are displayed) Labs Reviewed  LIPASE, BLOOD - Abnormal; Notable for the following components:      Result Value   Lipase 371 (*)    All other components within normal limits  COMPREHENSIVE METABOLIC PANEL - Abnormal; Notable for the following components:   Glucose, Bld 121 (*)    AST 218 (*)    ALT 230 (*)    Alkaline Phosphatase 137 (*)    All other components within normal limits  CBC - Abnormal; Notable for the following components:   WBC 12.2 (*)    All other components within normal limits  URINALYSIS, ROUTINE W REFLEX MICROSCOPIC - Abnormal; Notable for the following components:   APPearance TURBID (*)    Bilirubin Urine  SMALL (*)    Protein, ur 30 (*)    Leukocytes,Ua MODERATE (*)    Bacteria, UA RARE (*)    Squamous Epithelial / LPF >50 (*)    All other components within normal limits  POC URINE PREG, ED - Abnormal; Notable for the following components:  Preg Test, Ur POSITIVE (*)    All other components within normal limits  SARS CORONAVIRUS 2 BY RT PCR (HOSPITAL ORDER, [facility])    EKG None  Radiology US Abdomen Limited  Result Date: 06/17/2020 CLINICAL DATA:  Right upper quadrant abdominal pain, vomiting. Additional history provided by scanning technologist: Right upper quadrant pain with nausea/vomiting since this morning, 10 weeks IUP EXAM: ULTRASOUND ABDOMEN LIMITED RIGHT UPPER QUADRANT COMPARISON:  CT abdomen/pelvis 01/04/2019. FINDINGS: Gallbladder: There is cholecystolithiasis. The visualized gallbladder wall is thickened to 5 mm. No definite pericholecystic fluid is appreciated. A sonographic Percell Miller sign is elicited by the scanning technologist. Common bile duct: Diameter: 5 mm, within normal limits. Liver: No focal lesion identified. Within normal limits in parenchymal echogenicity. Portal vein is patent on color Doppler imaging with normal direction of blood flow towards the liver. IMPRESSION: Cholecystolithiasis with gallbladder wall thickening to 5 mm. Additionally, a sonographic Percell Miller sign is elicited by the scanning technologist. This constellation of findings is suspicious for acute cholecystitis and clinical correlation is recommended. Electronically Signed   By: Kellie Simmering DO   On: 06/17/2020 16:36    Procedures Procedures (including critical care time)  Medications Ordered in ED Medications  cefTRIAXone (ROCEPHIN) 2 g in sodium chloride 0.9 % 100 mL IVPB (2 g Intravenous New Bag/Given 06/17/20 1812)  ondansetron (ZOFRAN) injection 4 mg (4 mg Intravenous Given 06/17/20 1556)  sodium chloride 0.9 % bolus 1,000 mL (0 mLs Intravenous Stopped 06/17/20 1700)     ED Course  I have reviewed the triage vital signs and the nursing notes.  Pertinent labs & imaging results that were available during my care of the patient were reviewed by me and considered in my medical decision making (see chart for details).  Clinical Course as of Jun 17 1813  Fri Jun 17, 5213  [id number] 24 year old female 9 weeks and 6 days pregnant with complaint of nausea and vomiting onset today with generalized abdominal pain.  On exam, patient has well-appearing, abdomen soft, generally tender, no rebound tenderness. Patient is clinically fluids and Zofran. Review of records, had an ultrasound on May 21, 2020 showing IUP at 6 weeks 0 days. Labs reviewed, CBC with mild leukocytosis with white count 12.2, lipase is elevated at 371, CMP with elevated AST (218), ALT (230), alk phos 137, total bili normal.  Right upper quadrant ultrasound added to work-up.   [LM]  1748 Ultrasound right upper quadrant shows cholecystolithiasis with gallbladder thickening to 5 mm with positive sonographic Vanilla Heatherington sign concerning for acute cholecystitis. Case discussed with Dr. Arnoldo Morale with general surgery who requests consult OB. Case discussed with Dr. Harolyn Rutherford with OB who states there is nothing for the [facility] team to do for the patient is early in her pregnancy, recommends general surgery admit and manage the patient.  Case discussed again with Dr. Arnoldo Morale who requests hospitalist to admit.   [LM]  1813 Case discussed with Dr. Waldron Labs with [facility] consulted, will consult for admission.    [LM]    Clinical Course User Index [LM] Roque Lias   MDM Rules/Calculators/A&P                          Final Clinical Impression(s) / ED Diagnoses Final diagnoses:  Cholecystitis, acute  Acute pancreatitis, unspecified complication status, unspecified pancreatitis type  First trimester pregnancy    Rx / DC Orders ED Discharge Orders    None  Tacy Learn, PA-C 06/17/20  1814    Fredia Sorrow, MD 06/22/20 [phone]

## 2020-06-17 NOTE — Progress Notes (Signed)
    Faculty Practice OB/GYN Physician Phone Call Documentation  I received a call from Amy Melia, PA-C about  Amy [name], with report of her having gallstone pancreatitis and cholecystitis at [redacted]w[redacted]d (EDD 01/11/2021 by 6 week scan). General Surgery was consulted, they want to admit for observation and have her "pancreas cool down" before contemplating surgery.    Given that she is [redacted] weeks gestation, no monitoring is needed and she can be treated with analgesia (except NSAIDs) and antibiotics (no flouroquinolones, tetracyclines). She can be admitted there and we can be consulted with any questions. No acute intervention needed for this early first trimester gestation.  Please call [phone] for any concerns.    Jaynie Collins, MD, FACOG Obstetrician & Gynecologist, [facility] for [employer], [facility]

## 2020-06-17 NOTE — H&P (Signed)
TR[facility] H&P   Patient Demographics:    Keyra Virella, is a 24 y.o. female  MRN: [id number]   DOB - [birth year]  Admit Date - 06/17/2020  Outpatient Primary MD for the patient is Avon Gully, MD  Referring MD/NP/PA: PA Eulah Pont  Outpatient Specialists: [facility]  Patient coming from: Home  Chief Complaint  Patient presents with  . Emesis      HPI:   Female chaperone RN Bonita Quin was present into entire encounter    Maralyn Sago Matchinis Rittenour  is a 24 y.o. female, with past medical history of asthma, patient presents to ED secondary to complaints of nausea and vomiting, and abdominal pain, patient is currently [redacted] weeks pregnant, following with [facility], patient reported this morning she woke up with significant nausea and vomiting, she had total of 6 episodes, unable to keep anything down, she reports similar episodes few weeks ago, as well she does report abdominal pain, epigastric and right upper quadrant area, sharp, intermittent, which prompted her to come to ED, denies fever, chills, recent bowel habits, bladder habits, no vaginal bleeding, or other concerns, patient was recent hospitalization 2 months ago secondary to asthma exacerbation, but reports she has not been taking her Dulera or Singulair for some time as she ran out of her meds, where she reports she has been using her albuterol inhaler more frequently. - in ED patient work-up significant for elevated lipase at 317, right upper quadrant ultrasound significant for acute cholecystitis, but common bile duct dilated, and she had elevated LFTs, but total bili within normal limit, is a 12.2, ED physician discussed with OB and general surgery, Dr. Lovell Sheehan said no plan for surgery, especially with pancreatitis, if she needing surgery testicle of first, as well ED physician discussed  with OB, when she is [redacted] weeks gestation, no monitoring is needed, [facility] consulted to admit.    Review of systems:    In addition to the HPI above,  No Fever-chills, No Headache, No changes with Vision or hearing, No problems swallowing food or Liquids, No Chest pain, Cough or Shortness of Breath, Planes of abdominal pain, nausea, vomiting, Bowel movements are regular, No Blood in stool or Urine, No dysuria, No new skin rashes or bruises, No new joints pains-aches,  No new weakness, tingling, numbness in any extremity, No recent weight gain or loss, No polyuria, polydypsia or polyphagia, No significant Mental Stressors.  A full 10 point Review of Systems was done, except as stated above, all other Review of Systems were negative.   With Past History of the following :    Past Medical History:  Diagnosis Date  . Asthma   . Migraines   . Seasonal allergies       Past Surgical History:  Procedure Laterality Date  . TONSILLECTOMY    . WISDOM TOOTH EXTRACTION  Social History:     Social History   Tobacco Use  . Smoking status: Never Smoker  . Smokeless tobacco: Never Used  Substance Use Topics  . Alcohol use: Yes    Comment: occ      Family History :     Family History  Problem Relation Age of Onset  . Diabetes Mother   . Hypertension Mother   . Diabetes Father   . Cancer Father        basal cell cancer  . Cancer Maternal Uncle        brain      Home Medications:   Prior to Admission medications   Medication Sig Start Date End Date Taking? Authorizing Provider  albuterol (VENTOLIN HFA) 108 (90 Base) MCG/ACT inhaler Inhale 2-4 puffs by mouth every 4 hours as needed for wheezing, cough, and/or shortness of breath 04/14/20  Yes Shahmehdi, Valeria Batman, MD  Prenatal MV-Min-FA-Omega-3 (PRENATAL GUMMIES/DHA & FA PO) Take 1 each by mouth daily.   Yes [provider]  hydrOXYzine (ATARAX/VISTARIL) 25 MG tablet Take 1 tablet (25 mg  total) by mouth every 6 (six) hours as needed for itching. Patient not taking: Reported on 06/17/2020 03/11/20   Johnn Hai, PA-C  ipratropium-albuterol (DUONEB) 0.5-2.5 (3) MG/3ML SOLN Take 3 mLs by nebulization every 6 (six) hours. 04/14/20   Shahmehdi, Valeria Batman, MD  loratadine (CLARITIN) 10 MG tablet Take 1 tablet (10 mg total) by mouth daily. Patient not taking: Reported on 06/17/2020 04/14/20 04/14/21  Deatra James, MD  mometasone (ELOCON) 0.1 % cream Apply to affected area once a day Patient not taking: Reported on 06/17/2020 03/11/20 03/11/21  Johnn Hai, PA-C  mometasone-formoterol (DULERA) 100-5 MCG/ACT AERO INHALE 2 PUFFS BY MOUTH TWICE DAILY. RINSE MOUTH AFTER EACH USE Patient taking differently: Inhale 2 puffs into the lungs in the morning and at bedtime. INHALE 2 PUFFS BY MOUTH TWICE DAILY. RINSE MOUTH AFTER EACH USE 04/14/20   Shahmehdi, Valeria Batman, MD  montelukast (SINGULAIR) 10 MG tablet Take 1 tablet (10 mg total) by mouth at bedtime. Patient not taking: Reported on 06/17/2020 04/14/20   Deatra James, MD     Allergies:     Allergies  Allergen Reactions  . Cough Syrup [Guaifenesin] Nausea And Vomiting  . Other Nausea And Vomiting    Any coated medications     Physical Exam:   Vitals  Blood pressure 119/66, pulse 77, temperature 97.9 F (36.6 C), temperature source Oral, resp. rate 18, weight 83.5 kg, last menstrual period 03/24/2020, SpO2 100 %.   Female chaperone RN Vaughan Basta was present through entire evaluation and exam>  1. General well-developed young female, laying in bed, no apparent distress  2. Normal affect and insight, Not Suicidal or Homicidal, Awake Alert, Oriented X 3.  3. No F.N deficits, ALL C.Nerves Intact, Strength 5/5 all 4 extremities, Sensation intact all 4 extremities, Plantars down going.  4. Ears and Eyes appear Normal, Conjunctivae clear, PERRLA. Moist Oral Mucosa.  5. Supple Neck, No JVD, No cervical lymphadenopathy appriciated,  No Carotid Bruits.  6. Symmetrical Chest wall movement, Good air movement bilaterally, he did have some scattered wheezing bilaterally.  7. RRR, No Gallops, Rubs or Murmurs, No Parasternal Heave.  8. Positive Bowel Sounds, Abdomen Soft, she has tenderness in the epigastric and right upper quadrant area no organomegaly appriciated,No rebound -guarding or rigidity.  9.  No Cyanosis, Normal Skin Turgor, No Skin Rash or Bruise.  10. Good muscle tone,  joints appear normal , no effusions, Normal ROM.  11. No Palpable Lymph Nodes in Neck or Axillae    Data Review:    CBC Recent Labs  Lab 06/17/20 1446  WBC 12.2*  HGB 13.8  HCT 41.3  PLT 215  MCV 85.5  MCH 28.6  MCHC 33.4  RDW 13.7   ------------------------------------------------------------------------------------------------------------------  Chemistries  Recent Labs  Lab 06/17/20 1446  NA 137  K 4.0  CL 103  CO2 24  GLUCOSE 121*  BUN 10  CREATININE 0.56  CALCIUM 9.3  AST 218*  ALT 230*  ALKPHOS 137*  BILITOT 0.9   ------------------------------------------------------------------------------------------------------------------ estimated creatinine clearance is 113.3 mL/min (by C-G formula based on SCr of 0.56 mg/dL). ------------------------------------------------------------------------------------------------------------------ No results for input(s): TSH, T4TOTAL, T3FREE, THYROIDAB in the last 72 hours.  Invalid input(s): FREET3  Coagulation profile No results for input(s): INR, PROTIME in the last 168 hours. ------------------------------------------------------------------------------------------------------------------- No results for input(s): DDIMER in the last 72 hours. -------------------------------------------------------------------------------------------------------------------  Cardiac Enzymes No results for input(s): CKMB, TROPONINI, MYOGLOBIN in the last 168 hours.  Invalid input(s):  CK ------------------------------------------------------------------------------------------------------------------ No results found for: BNP   ---------------------------------------------------------------------------------------------------------------  Urinalysis    Component Value Date/Time   COLORURINE YELLOW 06/17/2020 1603   APPEARANCEUR TURBID (A) 06/17/2020 1603   LABSPEC 1.026 06/17/2020 1603   PHURINE 5.0 06/17/2020 1603   GLUCOSEU NEGATIVE 06/17/2020 1603   HGBUR NEGATIVE 06/17/2020 1603   BILIRUBINUR SMALL (A) 06/17/2020 1603   KETONESUR NEGATIVE 06/17/2020 1603   PROTEINUR 30 (A) 06/17/2020 1603   NITRITE NEGATIVE 06/17/2020 1603   LEUKOCYTESUR MODERATE (A) 06/17/2020 1603    ----------------------------------------------------------------------------------------------------------------   Imaging Results:    US Abdomen Limited  Result Date: 06/17/2020 CLINICAL DATA:  Right upper quadrant abdominal pain, vomiting. Additional history provided by scanning technologist: Right upper quadrant pain with nausea/vomiting since this morning, 10 weeks IUP EXAM: ULTRASOUND ABDOMEN LIMITED RIGHT UPPER QUADRANT COMPARISON:  CT abdomen/pelvis 01/04/2019. FINDINGS: Gallbladder: There is cholecystolithiasis. The visualized gallbladder wall is thickened to 5 mm. No definite pericholecystic fluid is appreciated. A sonographic Eulah Pont sign is elicited by the scanning technologist. Common bile duct: Diameter: 5 mm, within normal limits. Liver: No focal lesion identified. Within normal limits in parenchymal echogenicity. Portal vein is patent on color Doppler imaging with normal direction of blood flow towards the liver. IMPRESSION: Cholecystolithiasis with gallbladder wall thickening to 5 mm. Additionally, a sonographic Eulah Pont sign is elicited by the scanning technologist. This constellation of findings is suspicious for acute cholecystitis and clinical correlation is recommended.  Electronically Signed   By: Jackey Loge DO   On: 06/17/2020 16:36      Assessment & Plan:    Active Problems:   Acute pancreatitis   Acute cholecystitis   First trimester pregnancy  Cholecystolithiasis with concerns for acute pancreatitis/acute cholecystitis. -Patient presents with right upper quadrant, and epigastric tenderness, nausea and vomiting, mild leukocytosis, evaded LFTs (but total bili within normal limits), ultrasound significant for positive Murphy's signs, she does have gallbladder thickening to 5 mm, there is suspicious for acute cholecystitis. -ED discussed with general surgery, will evaluate patient in a.m.[name] -For now we will continue clear liquid diet as tolerated, continue with IV fluids, will keep on as needed oxycodone for pain control, continue antibiotic, will go for IV Rocephin) given we should avoid quinolones, tetracyclines and Flagyl in pregnancy).  First trimester pregnancy -Continue with prenatal vitamin -OB input appreciated in ED, given she is 10 weeks of gestation, no monitoring is needed, she can be  treated with analgesia (except NSAIDs) and antibiotics (no flouroquinolones, tetracyclines). She can be admitted there and we can be consulted with any questions. No acute intervention needed for this early first trimester gestation.  Asthma -She does have some wheezing, report this is at baseline, will resume back on Singulair and Dulera, will keep on scheduled DuoNebs and as needed albuterol.   DVT Prophylaxis Heparin  AM Labs Ordered, also please review Full Orders  Family Communication: Admission, patients condition and plan of care including tests being ordered have been discussed with the patient and mother at bedside who indicate understanding and agree with the plan and Code Status.  Code Status Full  Likely DC to Home  Condition GUARDED   Consults called:  gen Sx by ED, as well ED discussed with OB/GYN, note is in chart  Admission status:  Inpatient  Time spent in minutes : 55 minutes   Huey Bienenstock M.D on 06/17/2020 at 6:28 PM   [facility] - Office  [phone]

## 2020-06-17 NOTE — ED Triage Notes (Signed)
Patient presents to the ED after having vomited approximately 6 times since 0600 today.  Patient reports she is [redacted] weeks pregnant and is seen by [facility].

## 2020-06-18 LAB — CBC
HCT: 38.1 % (ref 36.0–46.0)
Hemoglobin: 12.4 g/dL (ref 12.0–15.0)
MCH: 27.8 pg (ref 26.0–34.0)
MCHC: 32.5 g/dL (ref 30.0–36.0)
MCV: 85.4 fL (ref 80.0–100.0)
Platelets: 168 10*3/uL (ref 150–400)
RBC: 4.46 MIL/uL (ref 3.87–5.11)
RDW: 13.6 % (ref 11.5–15.5)
WBC: 9.4 10*3/uL (ref 4.0–10.5)
nRBC: 0 % (ref 0.0–0.2)

## 2020-06-18 LAB — COMPREHENSIVE METABOLIC PANEL
ALT: 259 U/L — ABNORMAL HIGH (ref 0–44)
AST: 171 U/L — ABNORMAL HIGH (ref 15–41)
Albumin: 3.2 g/dL — ABNORMAL LOW (ref 3.5–5.0)
Alkaline Phosphatase: 119 U/L (ref 38–126)
Anion gap: 11 (ref 5–15)
BUN: 7 mg/dL (ref 6–20)
CO2: 19 mmol/L — ABNORMAL LOW (ref 22–32)
Calcium: 8.5 mg/dL — ABNORMAL LOW (ref 8.9–10.3)
Chloride: 108 mmol/L (ref 98–111)
Creatinine, Ser: 0.56 mg/dL (ref 0.44–1.00)
GFR calc Af Amer: >60 mL/min (ref >60)
GFR calc non Af Amer: >60 mL/min (ref >60)
Glucose, Bld: 81 mg/dL (ref 70–99)
Potassium: 4.2 mmol/L (ref 3.5–5.1)
Sodium: 138 mmol/L (ref 135–145)
Total Bilirubin: 0.6 mg/dL (ref 0.3–1.2)
Total Protein: 5.8 g/dL — ABNORMAL LOW (ref 6.5–8.1)

## 2020-06-18 MED ORDER — DIPHENHYDRAMINE HCL 25 MG PO CAPS
25.0000 mg | ORAL_CAPSULE | Freq: Four times a day (QID) | ORAL | Status: DC | PRN
  Administered 2020-06-18 – 2020-06-20 (×3): 25 mg via ORAL
  Filled 2020-06-18 (×3): qty 1

## 2020-06-18 MED ORDER — ALBUTEROL SULFATE (2.5 MG/3ML) 0.083% IN NEBU: 2.5000 mg | INHALATION_SOLUTION | RESPIRATORY_TRACT | Status: DC | PRN

## 2020-06-18 MED ORDER — PRENATAL MULTIVITAMIN CH
1.0000 | ORAL_TABLET | Freq: Every day | ORAL | Status: DC
  Filled 2020-06-18 (×5): qty 1

## 2020-06-18 NOTE — Plan of Care (Signed)

## 2020-06-18 NOTE — Progress Notes (Signed)
PROGRESS NOTE    Amy [name]  EUM:[id number] DOB: 03/24/1996 DOA: 06/17/2020 PCP: Avon Gully, MD    Brief Narrative:  Amy [name]  is a 24 y.o. female, with past medical history of asthma, patient presents to ED secondary to complaints of nausea and vomiting, and abdominal pain, patient is currently [redacted] weeks pregnant, [facility], patient reported this morning she woke up with significant nausea and vomiting, she had total of 6 episodes, unable to keep anything down, she reports similar episodes few weeks ago, as well she does report abdominal pain, epigastric and right upper quadrant area, sharp, intermittent, which prompted her to come to ED, denies fever, chills, recent bowel habits, bladder habits, no vaginal bleeding, or other concerns, patient was recent hospitalization 2 months ago secondary to asthma exacerbation, but reports she has not been taking her Dulera or Singulair for some time as she ran out of her meds, where she reports she has been using her albuterol inhaler more frequently. - in ED patient work-up significant for elevated lipase at 317, right upper quadrant ultrasound significant for acute cholecystitis, but common bile duct dilated, and she had elevated LFTs, but total bili within normal limit, is a 12.2, ED physician discussed with OB and general surgery, Dr. Lovell Sheehan said no plan for surgery, especially with pancreatitis, if she needing surgery testicle of first, as well ED physician discussed with OB, when she is [redacted] weeks gestation, no monitoring is needed, [facility] hospitalist consulted to admit.   Assessment & Plan:   Active Problems:   Acute pancreatitis   Acute cholecystitis   First trimester pregnancy   1. Acute pancreatitis, likely gallstone pancreatitis.  Continue to treat supportively with IV fluids, pain management and bowel rest. 2. Cholelithiasis with acute cholecystitis.  Currently on  intravenous antibiotics.  General surgery following.  Will coordinate care with obstetrics to determine timing of cholecystectomy. 3. First trimester intrauterine pregnancy.  OB input appreciated.  Continue supportive care.  No specialized monitoring needed at this time. 4. Asthma.  No shortness of breath or wheezing at this time.  Continue on inhaled steroids and bronchodilators as needed.   DVT prophylaxis: heparin injection 5,000 Units Start: 06/17/20 2200 SCDs Start: 06/17/20 2029  Code Status: Full code Family Communication: Discussed with patient Disposition Plan: Status is: Inpatient  Remains inpatient appropriate because:Ongoing diagnostic testing needed not appropriate for outpatient work up   Dispo: The patient is from: Home              Anticipated d/c is to: Home              Anticipated d/c date is: 3 days              Patient currently is not medically stable to d/c.    Discussed with Dr. Lovell Sheehan who has agreed to take the patient onto his service.  Hospitalist service will be available for any questions/concerns as needed.     Consultants:   General surgery  Procedures:     Antimicrobials:   Ceftriaxone 6/25 >   Subjective: Continues to have some abdominal pain, but not as bad as yesterday.  Currently on clear liquids.  No vomiting.  Objective: Vitals:   06/18/20 0813 06/18/20 0852 06/18/20 1415 06/18/20 1511  BP:  121/79 119/62   Pulse:  88 77   Resp:  18 16   Temp:  98.7 F (37.1 C) 98.6 F (37 C)   TempSrc:  Oral Oral   SpO2: 100% 98% 100% 99%  Weight:      Height:        Intake/Output Summary (Last 24 hours) at 06/18/2020 1827 Last data filed at 06/18/2020 1500 Gross per 24 hour  Intake 1394.16 ml  Output --  Net 1394.16 ml   Filed Weights   06/17/20 1433  Weight: 83.5 kg    Examination:  General exam: Appears calm and comfortable  Respiratory system: Clear to auscultation. Respiratory effort normal. Cardiovascular system: S1  & S2 heard, RRR. No JVD, murmurs, rubs, gallops or clicks. No pedal edema. Gastrointestinal system: Abdomen is nondistended, soft and tenderness in right upper quadrant. No organomegaly or masses felt. Normal bowel sounds heard. Central nervous system: Alert and oriented. No focal neurological deficits. Extremities: Symmetric 5 x 5 power. Skin: Dermatitis noted over lower extremities Psychiatry: Judgement and insight appear normal. Mood & affect appropriate.     Data Reviewed: I have personally reviewed following labs and imaging studies  CBC: Recent Labs  Lab 06/17/20 1446 06/18/20 0653  WBC 12.2* 9.4  HGB 13.8 12.4  HCT 41.3 38.1  MCV 85.5 85.4  PLT 215 168   Basic Metabolic Panel: Recent Labs  Lab 06/17/20 1446 06/18/20 0513  NA 137 138  K 4.0 4.2  CL 103 108  CO2 24 19*  GLUCOSE 121* 81  BUN 10 7  CREATININE 0.56 0.56  CALCIUM 9.3 8.5*   GFR: Estimated Creatinine Clearance: 113.3 mL/min (by C-G formula based on SCr of 0.56 mg/dL). Liver Function Tests: Recent Labs  Lab 06/17/20 1446 06/18/20 0513  AST 218* 171*  ALT 230* 259*  ALKPHOS 137* 119  BILITOT 0.9 0.6  PROT 6.9 5.8*  ALBUMIN 3.9 3.2*   Recent Labs  Lab 06/17/20 1446  LIPASE 371*   No results for input(s): AMMONIA in the last 168 hours. Coagulation Profile: No results for input(s): INR, PROTIME in the last 168 hours. Cardiac Enzymes: No results for input(s): CKTOTAL, CKMB, CKMBINDEX, TROPONINI in the last 168 hours. BNP (last 3 results) No results for input(s): PROBNP in the last 8760 hours. HbA1C: No results for input(s): HGBA1C in the last 72 hours. CBG: No results for input(s): GLUCAP in the last 168 hours. Lipid Profile: No results for input(s): CHOL, HDL, LDLCALC, TRIG, CHOLHDL, LDLDIRECT in the last 72 hours. Thyroid Function Tests: No results for input(s): TSH, T4TOTAL, FREET4, T3FREE, THYROIDAB in the last 72 hours. Anemia Panel: No results for input(s): VITAMINB12, FOLATE,  FERRITIN, TIBC, IRON, RETICCTPCT in the last 72 hours. Sepsis Labs: No results for input(s): PROCALCITON, LATICACIDVEN in the last 168 hours.  Recent Results (from the past 240 hour(s))  SARS Coronavirus 2 by RT PCR (hospital order, performed in [facility] hospital lab) Nasopharyngeal Nasopharyngeal Swab     Status: None   Collection Time: 06/17/20  5:40 PM   Specimen: Nasopharyngeal Swab  Result Value Ref Range Status   SARS Coronavirus 2 NEGATIVE NEGATIVE Final    Comment: (NOTE) SARS-CoV-2 target nucleic acids are NOT DETECTED.  The SARS-CoV-2 RNA is generally detectable in upper and lower respiratory specimens during the acute phase of infection. The lowest concentration of SARS-CoV-2 viral copies this assay can detect is 250 copies / mL. A negative result does not preclude SARS-CoV-2 infection and should not be used as the sole basis for treatment or other patient management decisions.  A negative result may occur with improper specimen collection / handling, submission of specimen other than nasopharyngeal swab, presence  of viral mutation(s) within the areas targeted by this assay, and inadequate number of viral copies (<250 copies / mL). A negative result must be combined with clinical observations, patient history, and epidemiological information.  Fact Sheet for Patients:   StrictlyIdeas.no  Fact Sheet for Healthcare Providers: BankingDealers.co.za  This test is not yet approved or  cleared by the [place] FDA and has been authorized for detection and/or diagnosis of SARS-CoV-2 by FDA under an Emergency Use Authorization (EUA).  This EUA will remain in effect (meaning this test can be used) for the duration of the COVID-19 declaration under Section 564(b)(1) of the Act, 21 U.S.C. section 360bbb-3(b)(1), unless the authorization is terminated or revoked sooner.  Performed at [facility], [address],  [place], [place] [address]          Radiology Studies: US Abdomen Limited  Result Date: 06/17/2020 CLINICAL DATA:  Right upper quadrant abdominal pain, vomiting. Additional history provided by scanning technologist: Right upper quadrant pain with nausea/vomiting since this morning, 10 weeks IUP EXAM: ULTRASOUND ABDOMEN LIMITED RIGHT UPPER QUADRANT COMPARISON:  CT abdomen/pelvis 01/04/2019. FINDINGS: Gallbladder: There is cholecystolithiasis. The visualized gallbladder wall is thickened to 5 mm. No definite pericholecystic fluid is appreciated. A sonographic Percell Miller sign is elicited by the scanning technologist. Common bile duct: Diameter: 5 mm, within normal limits. Liver: No focal lesion identified. Within normal limits in parenchymal echogenicity. Portal vein is patent on color Doppler imaging with normal direction of blood flow towards the liver. IMPRESSION: Cholecystolithiasis with gallbladder wall thickening to 5 mm. Additionally, a sonographic Percell Miller sign is elicited by the scanning technologist. This constellation of findings is suspicious for acute cholecystitis and clinical correlation is recommended. Electronically Signed   By: Kellie Simmering DO   On: 06/17/2020 16:36        Scheduled Meds: . heparin  5,000 Units Subcutaneous Q8H  . mometasone-formoterol  2 puff Inhalation BID  . montelukast  10 mg Oral QHS  . prenatal multivitamin  1 tablet Oral Q1200   Continuous Infusions: . cefTRIAXone (ROCEPHIN)  IV    . lactated ringers 75 mL/hr at 06/18/20 0858     LOS: 1 day    Time spent: 53mins    Kathie Dike, MD [facility]   If 7PM-7AM, please contact night-coverage [email or web address]  06/18/2020, 6:27 PM

## 2020-06-18 NOTE — Consult Note (Signed)
Reason for Consult: Cholecystitis, cholelithiasis, pancreatitis, intrauterine pregnancy Referring Physician: Dr. Elie Confer Matchinis Rittenour is an 24 y.o. female.  HPI: Patient is a 24 year old white female who is [redacted] weeks gestation intrauterine pregnancy who presents with a 24-hour history of worsening right upper quadrant abdominal pain, nausea, vomiting.  She was seen in the emergency room noted to have an elevated lipase.  Ultrasound the right upper quadrant revealed cholelithiasis with tenderness over the gallbladder.  Her liver enzyme tests were noted to be elevated.  She was admitted to the hospital for further evaluation and treatment after conferring with obstetrics.  Given is that she is only [redacted] weeks pregnant, she does not need monitoring.  This morning, she still has some right upper quadrant abdominal pain but is tolerating clear liquids.  She denies any fever or chills.  This is her first pregnancy.  Past Medical History:  Diagnosis Date  . Asthma   . Migraines   . Seasonal allergies     Past Surgical History:  Procedure Laterality Date  . TONSILLECTOMY    . WISDOM TOOTH EXTRACTION      Family History  Problem Relation Age of Onset  . Diabetes Mother   . Hypertension Mother   . Diabetes Father   . Cancer Father        basal cell cancer  . Cancer Maternal Uncle        brain    Social History:  reports that she has never smoked. She has never used smokeless tobacco. She reports current alcohol use. She reports that she does not use drugs.  Allergies:  Allergies  Allergen Reactions  . Cough Syrup [Guaifenesin] Nausea And Vomiting  . Other Nausea And Vomiting    Any coated medications    Medications: I have reviewed the patient's current medications.  Results for orders placed or performed during the hospital encounter of 06/17/20 (from the past 48 hour(s))  Lipase, blood     Status: Abnormal   Collection Time: 06/17/20  2:46 PM  Result Value Ref Range    Lipase 371 (H) 11 - 51 U/L    Comment: Performed at [facility], [address] [place], [place] [address]  Comprehensive metabolic panel     Status: Abnormal   Collection Time: 06/17/20  2:46 PM  Result Value Ref Range   Sodium 137 135 - 145 mmol/L   Potassium 4.0 3.5 - 5.1 mmol/L   Chloride 103 98 - 111 mmol/L   CO2 24 22 - 32 mmol/L   Glucose, Bld 121 (H) 70 - 99 mg/dL    Comment: Glucose reference range applies only to samples taken after fasting for at least 8 hours.   BUN 10 6 - 20 mg/dL   Creatinine, Ser 5.99 0.44 - 1.00 mg/dL   Calcium 9.3 8.9 - 35.7 mg/dL   Total Protein 6.9 6.5 - 8.1 g/dL   Albumin 3.9 3.5 - 5.0 g/dL   AST 017 (H) 15 - 41 U/L   ALT 230 (H) 0 - 44 U/L   Alkaline Phosphatase 137 (H) 38 - 126 U/L   Total Bilirubin 0.9 0.3 - 1.2 mg/dL   GFR calc non Af Amer >60 >60 mL/min   GFR calc Af Amer >60 >60 mL/min   Anion gap 10 5 - 15    Comment: Performed at [facility], [address] [place], [place] [address]  CBC     Status: Abnormal   Collection Time: 06/17/20  2:46 PM  Result Value Ref Range   WBC 12.2 (H) 4.0 - 10.5 K/uL   RBC 4.83 3.87 - 5.11 MIL/uL   Hemoglobin 13.8 12.0 - 15.0 g/dL   HCT 37.1 36 - 46 %   MCV 85.5 80.0 - 100.0 fL   MCH 28.6 26.0 - 34.0 pg   MCHC 33.4 30.0 - 36.0 g/dL   RDW 06.2 69.4 - 85.4 %   Platelets 215 150 - 400 K/uL   nRBC 0.0 0.0 - 0.2 %    Comment: Performed at [facility], [address] [place], [place] [address]  POC urine preg, ED     Status: Abnormal   Collection Time: 06/17/20  4:00 PM  Result Value Ref Range   Preg Test, Ur POSITIVE (A) NEGATIVE    Comment:        THE SENSITIVITY OF THIS METHODOLOGY IS >24 mIU/mL   Urinalysis, Routine w reflex microscopic     Status: Abnormal   Collection Time: 06/17/20  4:03 PM  Result Value Ref Range   Color, Urine YELLOW YELLOW   APPearance TURBID (A) CLEAR   Specific Gravity, Urine 1.026 1.005 - 1.030   pH 5.0 5.0 - 8.0   Glucose, UA NEGATIVE NEGATIVE mg/dL   Hgb  urine dipstick NEGATIVE NEGATIVE   Bilirubin Urine SMALL (A) NEGATIVE   Ketones, ur NEGATIVE NEGATIVE mg/dL   Protein, ur 30 (A) NEGATIVE mg/dL   Nitrite NEGATIVE NEGATIVE   Leukocytes,Ua MODERATE (A) NEGATIVE   RBC / HPF 6-10 0 - 5 RBC/hpf   WBC, UA 11-20 0 - 5 WBC/hpf   Bacteria, UA RARE (A) NONE SEEN   Squamous Epithelial / LPF >50 (H) 0 - 5   Mucus PRESENT    Amorphous Crystal PRESENT    Uric Acid Crys, UA PRESENT     Comment: Performed at [facility], [address], [place], [place] [address]  SARS Coronavirus 2 by RT PCR (hospital order, performed in [facility] hospital lab) Nasopharyngeal Nasopharyngeal Swab     Status: None   Collection Time: 06/17/20  5:40 PM   Specimen: Nasopharyngeal Swab  Result Value Ref Range   SARS Coronavirus 2 NEGATIVE NEGATIVE    Comment: (NOTE) SARS-CoV-2 target nucleic acids are NOT DETECTED.  The SARS-CoV-2 RNA is generally detectable in upper and lower respiratory specimens during the acute phase of infection. The lowest concentration of SARS-CoV-2 viral copies this assay can detect is 250 copies / mL. A negative result does not preclude SARS-CoV-2 infection and should not be used as the sole basis for treatment or other patient management decisions.  A negative result may occur with improper specimen collection / handling, submission of specimen other than nasopharyngeal swab, presence of viral mutation(s) within the areas targeted by this assay, and inadequate number of viral copies (<250 copies / mL). A negative result must be combined with clinical observations, patient history, and epidemiological information.  Fact Sheet for Patients:   BoilerBrush.com.cy  Fact Sheet for Healthcare Providers: https://pope.com/  This test is not yet approved or  cleared by the [place] FDA and has been authorized for detection and/or diagnosis of SARS-CoV-2 by FDA under an Emergency Use  Authorization (EUA).  This EUA will remain in effect (meaning this test can be used) for the duration of the COVID-19 declaration under Section 564(b)(1) of the Act, 21 U.S.C. section 360bbb-3(b)(1), unless the authorization is terminated or revoked sooner.  Performed at [facility], [address], [place], [place] [address]  Comprehensive metabolic panel     Status: Abnormal   Collection Time: 06/18/20  5:13 AM  Result Value Ref Range   Sodium 138 135 - 145 mmol/L   Potassium 4.2 3.5 - 5.1 mmol/L   Chloride 108 98 - 111 mmol/L   CO2 19 (L) 22 - 32 mmol/L   Glucose, Bld 81 70 - 99 mg/dL    Comment: Glucose reference range applies only to samples taken after fasting for at least 8 hours.   BUN 7 6 - 20 mg/dL   Creatinine, Ser 0.56 0.44 - 1.00 mg/dL   Calcium 8.5 (L) 8.9 - 10.3 mg/dL   Total Protein 5.8 (L) 6.5 - 8.1 g/dL   Albumin 3.2 (L) 3.5 - 5.0 g/dL   AST 171 (H) 15 - 41 U/L   ALT 259 (H) 0 - 44 U/L   Alkaline Phosphatase 119 38 - 126 U/L   Total Bilirubin 0.6 0.3 - 1.2 mg/dL   GFR calc non Af Amer >60 >60 mL/min   GFR calc Af Amer >60 >60 mL/min   Anion gap 11 5 - 15    Comment: Performed at [facility], [address], [place], [place] [address]  CBC     Status: None   Collection Time: 06/18/20  6:53 AM  Result Value Ref Range   WBC 9.4 4.0 - 10.5 K/uL   RBC 4.46 3.87 - 5.11 MIL/uL   Hemoglobin 12.4 12.0 - 15.0 g/dL   HCT 38.1 36 - 46 %   MCV 85.4 80.0 - 100.0 fL   MCH 27.8 26.0 - 34.0 pg   MCHC 32.5 30.0 - 36.0 g/dL   RDW 13.6 11.5 - 15.5 %   Platelets 168 150 - 400 K/uL   nRBC 0.0 0.0 - 0.2 %    Comment: Performed at [facility], [address], [place], [place] [address]    US Abdomen Limited  Result Date: 06/17/2020 CLINICAL DATA:  Right upper quadrant abdominal pain, vomiting. Additional history provided by scanning technologist: Right upper quadrant pain with nausea/vomiting since this morning, 10 weeks IUP EXAM: ULTRASOUND ABDOMEN LIMITED RIGHT UPPER  QUADRANT COMPARISON:  CT abdomen/pelvis 01/04/2019. FINDINGS: Gallbladder: There is cholecystolithiasis. The visualized gallbladder wall is thickened to 5 mm. No definite pericholecystic fluid is appreciated. A sonographic Percell Miller sign is elicited by the scanning technologist. Common bile duct: Diameter: 5 mm, within normal limits. Liver: No focal lesion identified. Within normal limits in parenchymal echogenicity. Portal vein is patent on color Doppler imaging with normal direction of blood flow towards the liver. IMPRESSION: Cholecystolithiasis with gallbladder wall thickening to 5 mm. Additionally, a sonographic Percell Miller sign is elicited by the scanning technologist. This constellation of findings is suspicious for acute cholecystitis and clinical correlation is recommended. Electronically Signed   By: Kellie Simmering DO   On: 06/17/2020 16:36    ROS:  Pertinent items are noted in HPI.  Blood pressure 121/79, pulse 88, temperature 98.7 F (37.1 C), temperature source Oral, resp. rate 18, height 5\' 4"  (1.626 m), weight 83.5 kg, last menstrual period 03/24/2020, SpO2 98 %. Physical Exam: Pleasant white female no acute distress Head is normocephalic, atraumatic Lungs clear to auscultation with good breath sounds bilaterally Heart examination reveals a regular rate and rhythm without S3, S4, murmurs Abdomen is soft with tenderness noted in the right upper quadrant to palpation.  Dry scaly skin is present.  No rigidity is noted.  H&P reviewed, ultrasound report reviewed  Assessment/Plan: Impression: Cholecystitis, cholelithiasis, pancreatitis, intrauterine  pregnancy at [redacted] weeks gestation Plan: Would continue clear liquid diet and supportive care at this time.  Will discuss with obstetrics proceeding with laparoscopic cholecystectomy during this admission given that this is early in her pregnancy.  She does realize that she could have a miscarriage or fetal development issues with surgery, though she may  not tolerate the nausea and vomiting throughout the pregnancy.  Franky Macho 06/18/2020, 8:59 AM

## 2020-06-19 LAB — BASIC METABOLIC PANEL
Anion gap: 10 (ref 5–15)
BUN: <5 mg/dL — ABNORMAL LOW (ref 6–20)
CO2: 22 mmol/L (ref 22–32)
Calcium: 8.6 mg/dL — ABNORMAL LOW (ref 8.9–10.3)
Chloride: 106 mmol/L (ref 98–111)
Creatinine, Ser: 0.51 mg/dL (ref 0.44–1.00)
GFR calc Af Amer: >60 mL/min (ref >60)
GFR calc non Af Amer: >60 mL/min (ref >60)
Glucose, Bld: 81 mg/dL (ref 70–99)
Potassium: 4.1 mmol/L (ref 3.5–5.1)
Sodium: 138 mmol/L (ref 135–145)

## 2020-06-19 LAB — HEPATIC FUNCTION PANEL
ALT: 195 U/L — ABNORMAL HIGH (ref 0–44)
AST: 74 U/L — ABNORMAL HIGH (ref 15–41)
Albumin: 3.2 g/dL — ABNORMAL LOW (ref 3.5–5.0)
Alkaline Phosphatase: 95 U/L (ref 38–126)
Bilirubin, Direct: 0.1 mg/dL (ref 0.0–0.2)
Indirect Bilirubin: 0.4 mg/dL (ref 0.3–0.9)
Total Bilirubin: 0.5 mg/dL (ref 0.3–1.2)
Total Protein: 5.7 g/dL — ABNORMAL LOW (ref 6.5–8.1)

## 2020-06-19 LAB — CBC
HCT: 37.2 % (ref 36.0–46.0)
Hemoglobin: 12.2 g/dL (ref 12.0–15.0)
MCH: 28.3 pg (ref 26.0–34.0)
MCHC: 32.8 g/dL (ref 30.0–36.0)
MCV: 86.3 fL (ref 80.0–100.0)
Platelets: 162 10*3/uL (ref 150–400)
RBC: 4.31 MIL/uL (ref 3.87–5.11)
RDW: 13.6 % (ref 11.5–15.5)
WBC: 9.7 10*3/uL (ref 4.0–10.5)
nRBC: 0 % (ref 0.0–0.2)

## 2020-06-19 LAB — SURGICAL PCR SCREEN
MRSA, PCR: POSITIVE — AB
Staphylococcus aureus: POSITIVE — AB

## 2020-06-19 LAB — LIPASE, BLOOD: Lipase: 22 U/L (ref 11–51)

## 2020-06-19 MED ORDER — CHLORHEXIDINE GLUCONATE CLOTH 2 % EX PADS: 6.0000 | MEDICATED_PAD | Freq: Once | CUTANEOUS | Status: AC

## 2020-06-19 MED ORDER — MUPIROCIN 2 % EX OINT
1.0000 "application " | TOPICAL_OINTMENT | Freq: Two times a day (BID) | CUTANEOUS | Status: DC
  Administered 2020-06-19 – 2020-06-20 (×3): 1 via NASAL
  Filled 2020-06-19: qty 22

## 2020-06-19 MED ORDER — CHLORHEXIDINE GLUCONATE CLOTH 2 % EX PADS
6.0000 | MEDICATED_PAD | Freq: Once | CUTANEOUS | Status: AC
  Administered 2020-06-19: 6 via TOPICAL

## 2020-06-19 MED ORDER — CHLORHEXIDINE GLUCONATE CLOTH 2 % EX PADS
6.0000 | MEDICATED_PAD | Freq: Every day | CUTANEOUS | Status: DC
  Administered 2020-06-20 – 2020-06-21 (×2): 6 via TOPICAL

## 2020-06-19 NOTE — Progress Notes (Signed)
Subjective: Patient still with right upper quadrant abdominal pain, though improved.  Nausea and vomiting have resolved.  Objective: Vital signs in last 24 hours: Temp:  [98.6 F (37 C)-98.8 F (37.1 C)] 98.8 F (37.1 C) (06/27 0420) Pulse Rate:  [71-79] 79 (06/27 0420) Resp:  [16] 16 (06/27 0420) BP: (110-119)/(58-62) 110/58 (06/27 0420) SpO2:  [98 %-100 %] 98 % (06/27 0420) Last BM Date: 06/14/20  Intake/Output from previous day: 06/26 0701 - 06/27 0700 In: 2992.9 [P.O.:960; I.V.:1932.9; IV Piggyback:100] Out: -  Intake/Output this shift: No intake/output data recorded.  General appearance: alert, cooperative and no distress Resp: clear to auscultation bilaterally Cardio: regular rate and rhythm, S1, S2 normal, no murmur, click, rub or gallop GI: Soft with mild tenderness to palpation in the right upper quadrant.  No rigidity is noted.  No distention noted.  Lab Results:  Recent Labs    06/18/20 0653 06/19/20 0501  WBC 9.4 9.7  HGB 12.4 12.2  HCT 38.1 37.2  PLT 168 162   BMET Recent Labs    06/18/20 0513 06/19/20 0501  NA 138 138  K 4.2 4.1  CL 108 106  CO2 19* 22  GLUCOSE 81 81  BUN 7 <5*  CREATININE 0.56 0.51  CALCIUM 8.5* 8.6*   PT/INR No results for input(s): LABPROT, INR in the last 72 hours.  Studies/Results: US Abdomen Limited  Result Date: 06/17/2020 CLINICAL DATA:  Right upper quadrant abdominal pain, vomiting. Additional history provided by scanning technologist: Right upper quadrant pain with nausea/vomiting since this morning, 10 weeks IUP EXAM: ULTRASOUND ABDOMEN LIMITED RIGHT UPPER QUADRANT COMPARISON:  CT abdomen/pelvis 01/04/2019. FINDINGS: Gallbladder: There is cholecystolithiasis. The visualized gallbladder wall is thickened to 5 mm. No definite pericholecystic fluid is appreciated. A sonographic Murphy sign is elicited by the scanning technologist. Common bile duct: Diameter: 5 mm, within normal limits. Liver: No focal lesion  identified. Within normal limits in parenchymal echogenicity. Portal vein is patent on color Doppler imaging with normal direction of blood flow towards the liver. IMPRESSION: Cholecystolithiasis with gallbladder wall thickening to 5 mm. Additionally, a sonographic Murphy sign is elicited by the scanning technologist. This constellation of findings is suspicious for acute cholecystitis and clinical correlation is recommended. Electronically Signed   By: Kyle  Golden DO   On: 06/17/2020 16:36    Anti-infectives: Anti-infectives (From admission, onward)   Start     Dose/Rate Route Frequency Ordered Stop   06/18/20 1830  cefTRIAXone (ROCEPHIN) 2 g in sodium chloride 0.9 % 100 mL IVPB     Discontinue     2 g 200 mL/hr over 30 Minutes Intravenous Every 24 hours 06/17/20 1827     06/17/20 1800  cefTRIAXone (ROCEPHIN) 2 g in sodium chloride 0.9 % 100 mL IVPB        2 g 200 mL/hr over 30 Minutes Intravenous  Once 06/17/20 1755 06/17/20 1919      Assessment/Plan: Impression: Acute cholecystitis, cholelithiasis, intrauterine pregnancy.  Lipase has normalized and I suspect this was secondary to her hyperemesis.  Her liver enzyme tests are improving.  Total bilirubin continues to be normal. Plan: We will proceed with laparoscopic cholecystectomy tomorrow.  The risks and benefits of the procedure including bleeding, infection, hepatobiliary injury, the possibility of an open procedure, and the possibility of birth defects or miscarriage were fully explained to the patient, who gave informed consent.  I did discuss with Dr. Eure, OB/GYN, who agrees that the patient needs her gallbladder out in order to maintain   adequate oral intake throughout the pregnancy.  LOS: 2 days    Franky Macho 06/19/2020

## 2020-06-19 NOTE — Plan of Care (Signed)

## 2020-06-19 NOTE — H&P (View-Only) (Signed)
Subjective: Patient still with right upper quadrant abdominal pain, though improved.  Nausea and vomiting have resolved.  Objective: Vital signs in last 24 hours: Temp:  [98.6 F (37 C)-98.8 F (37.1 C)] 98.8 F (37.1 C) (06/27 0420) Pulse Rate:  [71-79] 79 (06/27 0420) Resp:  [16] 16 (06/27 0420) BP: (110-119)/(58-62) 110/58 (06/27 0420) SpO2:  [98 %-100 %] 98 % (06/27 0420) Last BM Date: 06/14/20  Intake/Output from previous day: 06/26 0701 - 06/27 0700 In: 2992.9 [P.O.:960; I.V.:1932.9; IV Piggyback:100] Out: -  Intake/Output this shift: No intake/output data recorded.  General appearance: alert, cooperative and no distress Resp: clear to auscultation bilaterally Cardio: regular rate and rhythm, S1, S2 normal, no murmur, click, rub or gallop GI: Soft with mild tenderness to palpation in the right upper quadrant.  No rigidity is noted.  No distention noted.  Lab Results:  Recent Labs    06/18/20 0653 06/19/20 0501  WBC 9.4 9.7  HGB 12.4 12.2  HCT 38.1 37.2  PLT 168 162   BMET Recent Labs    06/18/20 0513 06/19/20 0501  NA 138 138  K 4.2 4.1  CL 108 106  CO2 19* 22  GLUCOSE 81 81  BUN 7 <5*  CREATININE 0.56 0.51  CALCIUM 8.5* 8.6*   PT/INR No results for input(s): LABPROT, INR in the last 72 hours.  Studies/Results: US Abdomen Limited  Result Date: 06/17/2020 CLINICAL DATA:  Right upper quadrant abdominal pain, vomiting. Additional history provided by scanning technologist: Right upper quadrant pain with nausea/vomiting since this morning, 10 weeks IUP EXAM: ULTRASOUND ABDOMEN LIMITED RIGHT UPPER QUADRANT COMPARISON:  CT abdomen/pelvis 01/04/2019. FINDINGS: Gallbladder: There is cholecystolithiasis. The visualized gallbladder wall is thickened to 5 mm. No definite pericholecystic fluid is appreciated. A sonographic Percell Miller sign is elicited by the scanning technologist. Common bile duct: Diameter: 5 mm, within normal limits. Liver: No focal lesion  identified. Within normal limits in parenchymal echogenicity. Portal vein is patent on color Doppler imaging with normal direction of blood flow towards the liver. IMPRESSION: Cholecystolithiasis with gallbladder wall thickening to 5 mm. Additionally, a sonographic Percell Miller sign is elicited by the scanning technologist. This constellation of findings is suspicious for acute cholecystitis and clinical correlation is recommended. Electronically Signed   By: Kellie Simmering DO   On: 06/17/2020 16:36    Anti-infectives: Anti-infectives (From admission, onward)   Start     Dose/Rate Route Frequency Ordered Stop   06/18/20 1830  cefTRIAXone (ROCEPHIN) 2 g in sodium chloride 0.9 % 100 mL IVPB     Discontinue     2 g 200 mL/hr over 30 Minutes Intravenous Every 24 hours 06/17/20 1827     06/17/20 1800  cefTRIAXone (ROCEPHIN) 2 g in sodium chloride 0.9 % 100 mL IVPB        2 g 200 mL/hr over 30 Minutes Intravenous  Once 06/17/20 1755 06/17/20 1919      Assessment/Plan: Impression: Acute cholecystitis, cholelithiasis, intrauterine pregnancy.  Lipase has normalized and I suspect this was secondary to her hyperemesis.  Her liver enzyme tests are improving.  Total bilirubin continues to be normal. Plan: We will proceed with laparoscopic cholecystectomy tomorrow.  The risks and benefits of the procedure including bleeding, infection, hepatobiliary injury, the possibility of an open procedure, and the possibility of birth defects or miscarriage were fully explained to the patient, who gave informed consent.  I did discuss with Dr. Elonda Husky, OB/GYN, who agrees that the patient needs her gallbladder out in order to maintain  adequate oral intake throughout the pregnancy.  LOS: 2 days    Amy [name] 06/19/2020

## 2020-06-19 NOTE — Progress Notes (Signed)
CRITICAL VALUE ALERT  Critical Value:  MRSA +  Date & Time Notied:  06/19/2020 at 1800  Provider Notified: yes  Orders Received/Actions taken: standing orders

## 2020-06-20 ENCOUNTER — Inpatient Hospital Stay (HOSPITAL_COMMUNITY): Payer: Medicaid Other | Admitting: Anesthesiology

## 2020-06-20 ENCOUNTER — Encounter (HOSPITAL_COMMUNITY): Payer: Self-pay | Admitting: Internal Medicine

## 2020-06-20 ENCOUNTER — Encounter (HOSPITAL_COMMUNITY)
Admission: AD | Disposition: A | Discharge status: Home / Self Care | Payer: Self-pay | Source: Home / Self Care | Attending: General Surgery

## 2020-06-20 HISTORY — PX: CHOLECYSTECTOMY: SHX55

## 2020-06-20 LAB — COMPREHENSIVE METABOLIC PANEL
ALT: 265 U/L — ABNORMAL HIGH (ref 0–44)
AST: 131 U/L — ABNORMAL HIGH (ref 15–41)
Albumin: 3.6 g/dL (ref 3.5–5.0)
Alkaline Phosphatase: 152 U/L — ABNORMAL HIGH (ref 38–126)
Anion gap: 10 (ref 5–15)
BUN: 9 mg/dL (ref 6–20)
CO2: 21 mmol/L — ABNORMAL LOW (ref 22–32)
Calcium: 8.5 mg/dL — ABNORMAL LOW (ref 8.9–10.3)
Chloride: 104 mmol/L (ref 98–111)
Creatinine, Ser: 0.6 mg/dL (ref 0.44–1.00)
GFR calc Af Amer: >60 mL/min (ref >60)
GFR calc non Af Amer: >60 mL/min (ref >60)
Glucose, Bld: 87 mg/dL (ref 70–99)
Potassium: 4 mmol/L (ref 3.5–5.1)
Sodium: 135 mmol/L (ref 135–145)
Total Bilirubin: 0.3 mg/dL (ref 0.3–1.2)
Total Protein: 6.5 g/dL (ref 6.5–8.1)

## 2020-06-20 LAB — CBC
HCT: 39.8 % (ref 36.0–46.0)
Hemoglobin: 13.2 g/dL (ref 12.0–15.0)
MCH: 28.5 pg (ref 26.0–34.0)
MCHC: 33.2 g/dL (ref 30.0–36.0)
MCV: 86 fL (ref 80.0–100.0)
Platelets: 183 10*3/uL (ref 150–400)
RBC: 4.63 MIL/uL (ref 3.87–5.11)
RDW: 13.5 % (ref 11.5–15.5)
WBC: 12.9 10*3/uL — ABNORMAL HIGH (ref 4.0–10.5)
nRBC: 0 % (ref 0.0–0.2)

## 2020-06-20 SURGERY — LAPAROSCOPIC CHOLECYSTECTOMY
Anesthesia: General | Site: Abdomen

## 2020-06-20 MED ORDER — NEOSTIGMINE METHYLSULFATE 3 MG/3ML IV SOSY
PREFILLED_SYRINGE | INTRAVENOUS | Status: AC
  Filled 2020-06-20: qty 3

## 2020-06-20 MED ORDER — EPHEDRINE 5 MG/ML INJ
INTRAVENOUS | Status: AC
  Filled 2020-06-20: qty 10

## 2020-06-20 MED ORDER — GLYCOPYRROLATE 0.2 MG/ML IJ SOLN
INTRAMUSCULAR | Status: DC | PRN
Start: 2020-06-20 — End: 2020-06-20
  Administered 2020-06-20: .4 mg via INTRAVENOUS

## 2020-06-20 MED ORDER — LACTATED RINGERS IV SOLN: INTRAVENOUS | Status: DC

## 2020-06-20 MED ORDER — LIDOCAINE 2% (20 MG/ML) 5 ML SYRINGE
INTRAMUSCULAR | Status: DC | PRN
  Administered 2020-06-20: 80 mg via INTRAVENOUS

## 2020-06-20 MED ORDER — ROCURONIUM BROMIDE 10 MG/ML (PF) SYRINGE
PREFILLED_SYRINGE | INTRAVENOUS | Status: DC | PRN
  Administered 2020-06-20: 40 mg via INTRAVENOUS

## 2020-06-20 MED ORDER — ONDANSETRON HCL 4 MG/2ML IJ SOLN
INTRAMUSCULAR | Status: DC | PRN
  Administered 2020-06-20: 4 mg via INTRAVENOUS

## 2020-06-20 MED ORDER — ONDANSETRON HCL 4 MG/2ML IJ SOLN: 4.0000 mg | Freq: Once | INTRAMUSCULAR | Status: DC | PRN

## 2020-06-20 MED ORDER — FENTANYL CITRATE (PF) 250 MCG/5ML IJ SOLN
INTRAMUSCULAR | Status: AC
  Filled 2020-06-20: qty 5

## 2020-06-20 MED ORDER — BUPIVACAINE LIPOSOME 1.3 % IJ SUSP
INTRAMUSCULAR | Status: AC
  Filled 2020-06-20: qty 20

## 2020-06-20 MED ORDER — GLYCOPYRROLATE PF 0.2 MG/ML IJ SOSY
PREFILLED_SYRINGE | INTRAMUSCULAR | Status: AC
  Filled 2020-06-20: qty 1

## 2020-06-20 MED ORDER — FENTANYL CITRATE (PF) 100 MCG/2ML IJ SOLN
25.0000 ug | INTRAMUSCULAR | Status: DC | PRN
  Administered 2020-06-20: 50 ug via INTRAVENOUS
  Filled 2020-06-20: qty 2

## 2020-06-20 MED ORDER — ONDANSETRON HCL 4 MG/2ML IJ SOLN
INTRAMUSCULAR | Status: AC
  Filled 2020-06-20: qty 2

## 2020-06-20 MED ORDER — FENTANYL CITRATE (PF) 100 MCG/2ML IJ SOLN
INTRAMUSCULAR | Status: DC | PRN
  Administered 2020-06-20 (×5): 50 ug via INTRAVENOUS

## 2020-06-20 MED ORDER — LIDOCAINE 2% (20 MG/ML) 5 ML SYRINGE
INTRAMUSCULAR | Status: AC
  Filled 2020-06-20: qty 5

## 2020-06-20 MED ORDER — ORAL CARE MOUTH RINSE: 15.0000 mL | Freq: Once | OROMUCOSAL | Status: AC

## 2020-06-20 MED ORDER — HEMOSTATIC AGENTS (NO CHARGE) OPTIME
TOPICAL | Status: DC | PRN
  Administered 2020-06-20: 1 via TOPICAL

## 2020-06-20 MED ORDER — NEOSTIGMINE METHYLSULFATE 10 MG/10ML IV SOLN
INTRAVENOUS | Status: DC | PRN
  Administered 2020-06-20: 3 mg via INTRAVENOUS

## 2020-06-20 MED ORDER — BUPIVACAINE LIPOSOME 1.3 % IJ SUSP
INTRAMUSCULAR | Status: DC | PRN
  Administered 2020-06-20: 20 mL

## 2020-06-20 MED ORDER — ROCURONIUM BROMIDE 10 MG/ML (PF) SYRINGE
PREFILLED_SYRINGE | INTRAVENOUS | Status: AC
  Filled 2020-06-20: qty 10

## 2020-06-20 MED ORDER — PROPOFOL 10 MG/ML IV BOLUS
INTRAVENOUS | Status: DC | PRN
  Administered 2020-06-20: 200 mg via INTRAVENOUS

## 2020-06-20 MED ORDER — EPHEDRINE SULFATE 50 MG/ML IJ SOLN
INTRAMUSCULAR | Status: DC | PRN
  Administered 2020-06-20: 5 mg via INTRAVENOUS
  Administered 2020-06-20: 10 mg via INTRAVENOUS

## 2020-06-20 MED ORDER — PROPOFOL 10 MG/ML IV BOLUS
INTRAVENOUS | Status: AC
  Filled 2020-06-20: qty 20

## 2020-06-20 MED ORDER — SODIUM CHLORIDE 0.9 % IR SOLN
Status: DC | PRN
  Administered 2020-06-20: 1000 mL

## 2020-06-20 MED ORDER — CHLORHEXIDINE GLUCONATE 0.12 % MT SOLN
15.0000 mL | Freq: Once | OROMUCOSAL | Status: AC
  Administered 2020-06-20: 15 mL via OROMUCOSAL

## 2020-06-20 SURGICAL SUPPLY — 44 items
APPLICATOR ARISTA FLEXITIP XL (MISCELLANEOUS) IMPLANT
APPLIER CLIP ROT 10 11.4 M/L (STAPLE) ×3
BAG RETRIEVAL 10 (BASKET) ×1
BAG RETRIEVAL 10MM (BASKET) ×1
CHLORAPREP W/TINT 26 (MISCELLANEOUS) ×3 IMPLANT
CLIP APPLIE ROT 10 11.4 M/L (STAPLE) ×1 IMPLANT
CLOTH BEACON ORANGE TIMEOUT ST (SAFETY) ×3 IMPLANT
COVER LIGHT HANDLE STERIS (MISCELLANEOUS) ×6 IMPLANT
COVER WAND RF STERILE (DRAPES) ×3 IMPLANT
DERMABOND ADVANCED (GAUZE/BANDAGES/DRESSINGS) ×2
DERMABOND ADVANCED .7 DNX12 (GAUZE/BANDAGES/DRESSINGS) ×1 IMPLANT
ELECT REM PT RETURN 9FT ADLT (ELECTROSURGICAL) ×3
ELECTRODE REM PT RTRN 9FT ADLT (ELECTROSURGICAL) ×1 IMPLANT
GLOVE BIOGEL PI IND STRL 7.0 (GLOVE) ×2 IMPLANT
GLOVE BIOGEL PI INDICATOR 7.0 (GLOVE) ×4
GLOVE SURG SS PI 7.5 STRL IVOR (GLOVE) ×3 IMPLANT
GOWN STRL REUS W/TWL LRG LVL3 (GOWN DISPOSABLE) ×9 IMPLANT
HEMOSTAT ARISTA ABSORB 3G PWDR (HEMOSTASIS) IMPLANT
HEMOSTAT SNOW SURGICEL 2X4 (HEMOSTASIS) ×3 IMPLANT
INST SET LAPROSCOPIC AP (KITS) ×3 IMPLANT
KIT TURNOVER KIT A (KITS) ×3 IMPLANT
MANIFOLD NEPTUNE II (INSTRUMENTS) ×3 IMPLANT
NDL HYPO 18GX1.5 BLUNT FILL (NEEDLE) ×1 IMPLANT
NDL INSUFFLATION 14GA 120MM (NEEDLE) ×1 IMPLANT
NEEDLE HYPO 18GX1.5 BLUNT FILL (NEEDLE) ×3 IMPLANT
NEEDLE HYPO 22GX1.5 SAFETY (NEEDLE) ×3 IMPLANT
NEEDLE INSUFFLATION 14GA 120MM (NEEDLE) ×3 IMPLANT
NS IRRIG 1000ML POUR BTL (IV SOLUTION) ×3 IMPLANT
PACK LAP CHOLE LZT030E (CUSTOM PROCEDURE TRAY) ×3 IMPLANT
PAD ARMBOARD 7.5X6 YLW CONV (MISCELLANEOUS) ×3 IMPLANT
SET BASIN LINEN APH (SET/KITS/TRAYS/PACK) ×3 IMPLANT
SET TUBE SMOKE EVAC HIGH FLOW (TUBING) ×3 IMPLANT
SLEEVE ENDOPATH XCEL 5M (ENDOMECHANICALS) ×3 IMPLANT
SUT MNCRL AB 4-0 PS2 18 (SUTURE) ×6 IMPLANT
SUT VICRYL 0 UR6 27IN ABS (SUTURE) ×3 IMPLANT
SYR 20ML LL LF (SYRINGE) ×6 IMPLANT
SYS BAG RETRIEVAL 10MM (BASKET) ×1
SYSTEM BAG RETRIEVAL 10MM (BASKET) ×1 IMPLANT
TROCAR ENDO BLADELESS 11MM (ENDOMECHANICALS) ×3 IMPLANT
TROCAR XCEL NON-BLD 5MMX100MML (ENDOMECHANICALS) ×3 IMPLANT
TROCAR XCEL UNIV SLVE 11M 100M (ENDOMECHANICALS) ×3 IMPLANT
TUBE CONNECTING 12'X1/4 (SUCTIONS) ×1
TUBE CONNECTING 12X1/4 (SUCTIONS) ×2 IMPLANT
WARMER LAPAROSCOPE (MISCELLANEOUS) ×3 IMPLANT

## 2020-06-20 NOTE — Interval H&P Note (Signed)
History and Physical Interval Note:  06/20/2020 9:38 AM  Amy [name]  has presented today for surgery, with the diagnosis of acute cholecystitis, cholelithiasis, pregnant.  The various methods of treatment have been discussed with the patient and family. After consideration of risks, benefits and other options for treatment, the patient has consented to  Procedure(s): LAPAROSCOPIC CHOLECYSTECTOMY (N/A) as a surgical intervention.  The patient's history has been reviewed, patient examined, no change in status, stable for surgery.  I have reviewed the patient's chart and labs.  Questions were answered to the patient's satisfaction.     Franky Macho

## 2020-06-20 NOTE — Anesthesia Postprocedure Evaluation (Signed)
Anesthesia Post Note  Patient: Amy [name]  Procedure(s) Performed: LAPAROSCOPIC CHOLECYSTECTOMY (N/A Abdomen)  Patient location during evaluation: PACU Anesthesia Type: General Level of consciousness: awake and alert and patient cooperative Pain management: satisfactory to patient Vital Signs Assessment: post-procedure vital signs reviewed and stable Respiratory status: spontaneous breathing Cardiovascular status: stable Postop Assessment: no apparent nausea or vomiting Anesthetic complications: no   No complications documented.   Last Vitals:  Vitals:   06/20/20 1215 06/20/20 1228  BP: 117/71 123/74  Pulse: 84 78  Resp: 14 16  Temp:  36.4 C  SpO2: 99% 100%    Last Pain:  Vitals:   06/20/20 1300  TempSrc:   PainSc: 9                  Quinne Pires

## 2020-06-20 NOTE — Progress Notes (Signed)
Attempted IS instruction but pt states that "she already knows how to do it and is too sick to do that right now". Pt educated on importance of IS post surgery. Pt instructed to call for RT should she need assistance or instruction on IS usage.

## 2020-06-20 NOTE — Anesthesia Preprocedure Evaluation (Signed)
Anesthesia Evaluation  Patient identified by MRN, date of birth, ID band Patient awake    Reviewed: Allergy & Precautions, H&P , NPO status , Patient's Chart, lab work & pertinent test results, reviewed documented beta blocker date and time   Airway Mallampati: II  TM Distance: >3 FB Neck ROM: full    Dental no notable dental hx. (+) Teeth Intact   Pulmonary asthma ,    Pulmonary exam normal breath sounds clear to auscultation       Cardiovascular Exercise Tolerance: Good negative cardio ROS   Rhythm:regular Rate:Normal     Neuro/Psych  Headaches, negative psych ROS   GI/Hepatic negative GI ROS, Neg liver ROS,   Endo/Other  negative endocrine ROS  Renal/GU negative Renal ROS  negative genitourinary   Musculoskeletal   Abdominal   Peds  Hematology negative hematology ROS (+)   Anesthesia Other Findings Pt. Requires cholecystectomy due to acute cholecystitis and pancreatitis.  Discussed risks of anesthesia with patient.  Will avoid Benzodiazepines and NSAIDs.  Pt understands slightly increased risk to pregnancy and fetus and agrees to proceed.  Reproductive/Obstetrics (+) Pregnancy ([redacted] weeks gestation)                             Anesthesia Physical Anesthesia Plan  ASA: II  Anesthesia Plan: General   Post-op Pain Management:    Induction:   PONV Risk Score and Plan: Ondansetron  Airway Management Planned:   Additional Equipment:   Intra-op Plan:   Post-operative Plan:   Informed Consent: I have reviewed the patients History and Physical, chart, labs and discussed the procedure including the risks, benefits and alternatives for the proposed anesthesia with the patient or authorized representative who has indicated his/her understanding and acceptance.     Dental Advisory Given  Plan Discussed with: CRNA  Anesthesia Plan Comments:         Anesthesia Quick  Evaluation

## 2020-06-20 NOTE — Anesthesia Procedure Notes (Signed)
Procedure Name: Intubation Date/Time: 06/20/2020 10:39 AM Performed by: Lenox Ahr, CRNA Pre-anesthesia Checklist: Patient identified, Patient being monitored, Timeout performed, Emergency Drugs available and Suction available Patient Re-evaluated:Patient Re-evaluated prior to induction Oxygen Delivery Method: Circle System Utilized Preoxygenation: Pre-oxygenation with 100% oxygen Induction Type: IV induction Ventilation: Mask ventilation without difficulty Laryngoscope Size: Miller and 2 Grade View: Grade II Tube type: Oral Tube size: 7.0 mm Number of attempts: 1 Airway Equipment and Method: stylet Placement Confirmation: ETT inserted through vocal cords under direct vision,  positive ETCO2 and breath sounds checked- equal and bilateral Secured at: 21 cm Tube secured with: Tape Dental Injury: Teeth and Oropharynx as per pre-operative assessment

## 2020-06-20 NOTE — Progress Notes (Signed)
Pt is [redacted] weeks pregnant. FHR- 121 confirmed with Doppler

## 2020-06-20 NOTE — Op Note (Signed)
Patient:  Amy [name]  DOB:  21-Feb-1996  MRN:  [id number]   Preop Diagnosis: Acute cholecystitis, cholelithiasis, intrauterine pregnancy  Postop Diagnosis: Same  Procedure: Laparoscopic cholecystectomy  Surgeon: Franky Macho, MD  Assistant: Algis Greenhouse, MD  Anes: General endotracheal  Indications: Patient is a 24 year old white female [redacted] weeks gestation intrauterine pregnancy who presented with cholecystitis, cholelithiasis, and pancreatitis. The pancreatitis has quickly resolved. She now presents for laparoscopic cholecystectomy. The risks and benefits of the procedure including bleeding, infection, hepatobiliary injury, and the possibility of an open procedure were fully explained to the patient, who gave informed consent.  Procedure note: The patient was placed in supine position. After induction of general endotracheal anesthesia, the abdomen was prepped and draped using the usual sterile technique with ChloraPrep. Surgical site confirmation was performed.  A supraumbilical incision was made down to the fascia. A Veress needle was introduced into the abdominal cavity and confirmation of placement was done using the saline drop test. The abdomen was then insufflated to 15 mmHg pressure. An 11 mm trocar was introduced into the abdominal cavity under direct visualization without difficulty. Patient was placed in reverse Trendelenburg position and an additional 11 mm trocar was placed in the epigastric region and 5 mm trochars were placed the right upper quadrant and right flank regions. The uterus was inspected and noted to be gravid without evidence of injury. The liver was inspected and noted within normal limits. The gallbladder was retracted in a dynamic fashion in order to provide a critical view of the triangle of Calot. The cystic duct was first identified. Its juncture to the infundibulum was fully identified. Endoclips were placed proximally and distally on the cystic  duct, and the cystic duct was divided. This was likewise done to the cystic artery. The gallbladder was freed away from the gallbladder fossa using Bovie electrocautery. The gallbladder was delivered through the epigastric trocar site using an Endo Catch bag. The gallbladder fossa was inspected and no abnormal bleeding or bile leakage was noted. Surgicel was placed in the gallbladder fossa. All fluid and air were then evacuated from the abdominal cavity prior to removal of the trochars.  All wounds were irrigated with normal saline. All wounds were injected with Exparel. All incisions were closed using a 4-0 Monocryl subcuticular suture. Dermabond was applied.  All tape and needle counts were correct at the end of the procedure. The patient was extubated in the operating room and transferred to PACU in stable condition.  Dr. Henreitta Leber assisted as no certified assistant was available.  Complications: None  EBL: Minimal  Specimen: Gallbladder

## 2020-06-20 NOTE — Transfer of Care (Signed)
Immediate Anesthesia Transfer of Care Note  Patient: Amy [name]  Procedure(s) Performed: LAPAROSCOPIC CHOLECYSTECTOMY (N/A Abdomen)  Patient Location: PACU  Anesthesia Type:General  Level of Consciousness: awake, alert  and oriented  Airway & Oxygen Therapy: Patient Spontanous Breathing and Patient connected to nasal cannula oxygen  Post-op Assessment: Report given to RN, Post -op Vital signs reviewed and stable and Patient moving all extremities X 4  Post vital signs: Reviewed and stable2  Last Vitals:  Vitals Value Taken Time  BP 125/71 06/20/20 1133  Temp    Pulse 71 06/20/20 1135  Resp 17 06/20/20 1135  SpO2 100 % 06/20/20 1135  Vitals shown include unvalidated device data.  Last Pain:  Vitals:   06/20/20 0931  TempSrc: Oral  PainSc: 0-No pain         Complications: No complications documented.

## 2020-06-21 ENCOUNTER — Encounter (HOSPITAL_COMMUNITY): Payer: Self-pay | Admitting: General Surgery

## 2020-06-21 ENCOUNTER — Encounter: Payer: Self-pay | Admitting: Women's Health

## 2020-06-21 DIAGNOSIS — Z9049 Acquired absence of other specified parts of digestive tract: Secondary | ICD-10-CM | POA: Insufficient documentation

## 2020-06-21 LAB — SURGICAL PATHOLOGY

## 2020-06-21 MED ORDER — HYDROCODONE-ACETAMINOPHEN 5-325 MG PO TABS: 1.0000 | ORAL_TABLET | ORAL | 0 refills | Status: DC | PRN

## 2020-06-21 NOTE — Discharge Summary (Signed)
Physician Discharge Summary  Patient ID: Amy [name] MRN: [id number] DOB/AGE: 02/09/1996 24 y.o.  Admit date: 06/17/2020 Discharge date: 06/21/2020  Admission Diagnoses: Acute cholecystitis, cholelithiasis, intrauterine pregnancy  Discharge Diagnoses: Same Active Problems:   Acute pancreatitis   Cholecystitis, acute   First trimester pregnancy   Discharged Condition: good  Hospital Course: Patient is a 24 year old white female who was [redacted] weeks pregnant who presented with right upper quadrant abdominal pain, nausea, and vomiting.  She had acute cholecystitis with cholelithiasis.  She also had elevated lipase which quickly resolved with admission.  I did discuss her care with Dr. Despina Hidden of obstetrics and it was elected to proceed with a laparoscopic cholecystectomy.  This was performed on 06/20/2020.  She tolerated the procedure well.  Her postoperative course was unremarkable.  Her diet was advanced of difficulty.  The patient is being discharged home on 06/21/2020 in good and improving condition.  No evidence of miscarriage at this time.  Treatments: surgery: Laparoscopic cholecystectomy on 06/20/2020  Discharge Exam: Blood pressure 115/66, pulse 88, temperature 98.6 F (37 C), temperature source Oral, resp. rate 18, height 5\' 4"  (1.626 m), weight 83.5 kg, last menstrual period 03/24/2020, SpO2 98 %. General appearance: alert, cooperative and no distress Resp: clear to auscultation bilaterally Cardio: regular rate and rhythm, S1, S2 normal, no murmur, click, rub or gallop GI: Soft, incisions healing well.  Disposition: Discharge disposition: 01-Home or Self Care       Discharge Instructions    Diet - low sodium heart healthy   Complete by: As directed    Increase activity slowly   Complete by: As directed      Allergies as of 06/21/2020      Reactions   Cough Syrup [guaifenesin] Nausea And Vomiting   Other Nausea And Vomiting   Any coated medications       Medication List    STOP taking these medications   hydrOXYzine 25 MG tablet Commonly known as: ATARAX/VISTARIL     TAKE these medications   albuterol 108 (90 Base) MCG/ACT inhaler Commonly known as: VENTOLIN HFA Inhale 2-4 puffs by mouth every 4 hours as needed for wheezing, cough, and/or shortness of breath   Dulera 100-5 MCG/ACT Aero Generic drug: mometasone-formoterol INHALE 2 PUFFS BY MOUTH TWICE DAILY. RINSE MOUTH AFTER EACH USE What changed:   how much to take  how to take this  when to take this   HYDROcodone-acetaminophen 5-325 MG tablet Commonly known as: Norco Take 1 tablet by mouth every 4 (four) hours as needed for moderate pain.   ipratropium-albuterol 0.5-2.5 (3) MG/3ML Soln Commonly known as: DUONEB Take 3 mLs by nebulization every 6 (six) hours.   loratadine 10 MG tablet Commonly known as: Claritin Take 1 tablet (10 mg total) by mouth daily.   mometasone 0.1 % cream Commonly known as: Elocon Apply to affected area once a day   montelukast 10 MG tablet Commonly known as: SINGULAIR Take 1 tablet (10 mg total) by mouth at bedtime.   PRENATAL GUMMIES/DHA & FA PO Take 1 each by mouth daily.       Follow-up Information    06/23/2020, MD Follow up.   Specialty: General Surgery Why: Will call you next week for follow up Contact information: [id number] [place] [address]               Signed: [phone] 06/21/2020, 8:22 AM

## 2020-06-21 NOTE — Discharge Instructions (Signed)
Laparoscopic Cholecystectomy, Care After This sheet gives you information about how to care for yourself after your procedure. Your health care provider may also give you more specific instructions. If you have problems or questions, contact your health care provider. What can I expect after the procedure? After the procedure, it is common to have:  Pain at your incision sites. You will be given medicines to control this pain.  Mild nausea or vomiting.  Bloating and possible shoulder pain from the air-like gas that was used during the procedure. Follow these instructions at home: Incision care   Follow instructions from your health care provider about how to take care of your incisions. Make sure you: ? Wash your hands with soap and water before you change your bandage (dressing). If soap and water are not available, use hand sanitizer. ? Change your dressing as told by your health care provider. ? Leave stitches (sutures), skin glue, or adhesive strips in place. These skin closures may need to be in place for 2 weeks or longer. If adhesive strip edges start to loosen and curl up, you may trim the loose edges. Do not remove adhesive strips completely unless your health care provider tells you to do that.  Do not take baths, swim, or use a hot tub until your health care provider approves. Ask your health care provider if you can take showers. You may only be allowed to take sponge baths for bathing.  Check your incision area every day for signs of infection. Check for: ? More redness, swelling, or pain. ? More fluid or blood. ? Warmth. ? Pus or a bad smell. Activity  Do not drive or use heavy machinery while taking prescription pain medicine.  Do not lift anything that is heavier than 10 lb (4.5 kg) until your health care provider approves.  Do not play contact sports until your health care provider approves.  Do not drive for 24 hours if you were given a medicine to help you relax  (sedative).  Rest as needed. Do not return to work or school until your health care provider approves. General instructions  Take over-the-counter and prescription medicines only as told by your health care provider.  To prevent or treat constipation while you are taking prescription pain medicine, your health care provider may recommend that you: ? Drink enough fluid to keep your urine clear or pale yellow. ? Take over-the-counter or prescription medicines. ? Eat foods that are high in fiber, such as fresh fruits and vegetables, whole grains, and beans. ? Limit foods that are high in fat and processed sugars, such as fried and sweet foods. Contact a health care provider if:  You develop a rash.  You have more redness, swelling, or pain around your incisions.  You have more fluid or blood coming from your incisions.  Your incisions feel warm to the touch.  You have pus or a bad smell coming from your incisions.  You have a fever.  One or more of your incisions breaks open. Get help right away if:  You have trouble breathing.  You have chest pain.  You have increasing pain in your shoulders.  You faint or feel dizzy when you stand.  You have severe pain in your abdomen.  You have nausea or vomiting that lasts for more than one day.  You have leg pain. This information is not intended to replace advice given to you by your health care provider. Make sure you discuss any questions you have   with your health care provider. Document Revised: 11/22/2017 Document Reviewed: 05/28/2016 [employer]vier Patient Education  2020 [employer]  

## 2020-06-23 ENCOUNTER — Encounter: Payer: Self-pay | Admitting: *Deleted

## 2020-06-29 ENCOUNTER — Other Ambulatory Visit: Payer: Self-pay | Admitting: Obstetrics & Gynecology

## 2020-06-29 DIAGNOSIS — Z3682 Encounter for antenatal screening for nuchal translucency: Secondary | ICD-10-CM

## 2020-06-30 ENCOUNTER — Ambulatory Visit (INDEPENDENT_AMBULATORY_CARE_PROVIDER_SITE_OTHER): Payer: Medicaid Other | Admitting: Women's Health

## 2020-06-30 ENCOUNTER — Ambulatory Visit (INDEPENDENT_AMBULATORY_CARE_PROVIDER_SITE_OTHER): Payer: Medicaid Other

## 2020-06-30 ENCOUNTER — Ambulatory Visit: Payer: Medicaid Other | Admitting: *Deleted

## 2020-06-30 ENCOUNTER — Encounter: Payer: Self-pay | Admitting: Women's Health

## 2020-06-30 VITALS — BP 105/59 | HR 82 | Wt 169.0 lb

## 2020-06-30 DIAGNOSIS — Z9889 Other specified postprocedural states: Secondary | ICD-10-CM

## 2020-06-30 DIAGNOSIS — Z3A12 12 weeks gestation of pregnancy: Secondary | ICD-10-CM | POA: Diagnosis not present

## 2020-06-30 DIAGNOSIS — N83201 Unspecified ovarian cyst, right side: Secondary | ICD-10-CM | POA: Diagnosis not present

## 2020-06-30 DIAGNOSIS — O99341 Other mental disorders complicating pregnancy, first trimester: Secondary | ICD-10-CM

## 2020-06-30 DIAGNOSIS — Z3401 Encounter for supervision of normal first pregnancy, first trimester: Secondary | ICD-10-CM

## 2020-06-30 DIAGNOSIS — Z3400 Encounter for supervision of normal first pregnancy, unspecified trimester: Secondary | ICD-10-CM | POA: Insufficient documentation

## 2020-06-30 DIAGNOSIS — Z3682 Encounter for antenatal screening for nuchal translucency: Secondary | ICD-10-CM

## 2020-06-30 DIAGNOSIS — F419 Anxiety disorder, unspecified: Secondary | ICD-10-CM | POA: Insufficient documentation

## 2020-06-30 DIAGNOSIS — F4310 Post-traumatic stress disorder, unspecified: Secondary | ICD-10-CM | POA: Diagnosis not present

## 2020-06-30 DIAGNOSIS — N83209 Unspecified ovarian cyst, unspecified side: Secondary | ICD-10-CM | POA: Insufficient documentation

## 2020-06-30 LAB — POCT URINALYSIS DIPSTICK OB
Blood, UA: NEGATIVE
Glucose, UA: NEGATIVE
Ketones, UA: NEGATIVE
Leukocytes, UA: NEGATIVE
Nitrite, UA: NEGATIVE
POC,PROTEIN,UA: NEGATIVE

## 2020-06-30 NOTE — Patient Instructions (Signed)
Amy [name], I greatly value your feedback.  If you receive a survey following your visit with [place] today, we appreciate you taking the time to fill it out.  Thanks, Amy [name], CNM, WHNP-BC   [facility] ([address] [place], [place] [address]) Entrance C, located off of [address] Free 24/7 valet parking   Nausea & Vomiting  Have saltine crackers or pretzels by your bed and eat a few bites before you raise your head out of bed in the morning  Eat small frequent meals throughout the day instead of large meals  Drink plenty of fluids throughout the day to stay hydrated, just don't drink a lot of fluids with your meals.  This can make your stomach fill up faster making you feel sick  Do not brush your teeth right after you eat  Products with real ginger are good for nausea, like ginger ale and ginger hard candy Make sure it says made with real ginger!  Sucking on sour candy like lemon heads is also good for nausea  If your prenatal vitamins make you nauseated, take them at night so you will sleep through the nausea  Sea Bands  If you feel like you need medicine for the nausea & vomiting please let us know  If you are unable to keep any fluids or food down please let us know   Constipation  Drink plenty of fluid, preferably water, throughout the day  Eat foods high in fiber such as fruits, vegetables, and grains  Exercise, such as walking, is a good way to keep your bowels regular  Drink warm fluids, especially warm prune juice, or decaf coffee  Eat a 1/2 cup of real oatmeal (not instant), 1/2 cup applesauce, and 1/2-1 cup warm prune juice every day  If needed, you may take Colace (docusate sodium) stool softener once or twice a day to help keep the stool soft.   If you still are having problems with constipation, you may take Miralax once daily as needed to help keep your bowels regular.   Home Blood Pressure Monitoring for  Patients   Your provider has recommended that you check your blood pressure (BP) at least once a week at home. If you do not have a blood pressure cuff at home, one will be provided for you. Contact your provider if you have not received your monitor within 1 week.   Helpful Tips for Accurate Home Blood Pressure Checks   Don't smoke, exercise, or drink caffeine 30 minutes before checking your BP  Use the restroom before checking your BP (a full bladder can raise your pressure)  Relax in a comfortable upright chair  Feet on the ground  Left arm resting comfortably on a flat surface at the level of your heart  Legs uncrossed  Back supported  Sit quietly and don't talk  Place the cuff on your bare arm  Adjust snuggly, so that only two fingertips can fit between your skin and the top of the cuff  Check 2 readings separated by at least one minute  Keep a log of your BP readings  For a visual, please reference this diagram: http://ccnc.care/bpdiagram  Provider Name: [facility]     Phone: [phone]  Zone 1: ALL CLEAR  Continue to monitor your symptoms:   BP reading is less than 140 (top number) or less than 90 (bottom number)   No right upper stomach pain  No headaches or  seeing spots  No feeling nauseated or throwing up  No swelling in face and hands  Zone 2: CAUTION Call your doctor's office for any of the following:   BP reading is greater than 140 (top number) or greater than 90 (bottom number)   Stomach pain under your ribs in the middle or right side  Headaches or seeing spots  Feeling nauseated or throwing up  Swelling in face and hands  Zone 3: EMERGENCY  Seek immediate medical care if you have any of the following:   BP reading is greater than160 (top number) or greater than 110 (bottom number)  Severe headaches not improving with Tylenol  Serious difficulty catching your breath  Any worsening symptoms from Zone 2    First Trimester  of Pregnancy The first trimester of pregnancy is from week 1 until the end of week 12 (months 1 through 3). A week after a sperm fertilizes an egg, the egg will implant on the wall of the uterus. This embryo will begin to develop into a baby. Genes from you and your partner are forming the baby. The female genes determine whether the baby is a boy or a girl. At 6-8 weeks, the eyes and face are formed, and the heartbeat can be seen on ultrasound. At the end of 12 weeks, all the baby's organs are formed.  Now that you are pregnant, you will want to do everything you can to have a healthy baby. Two of the most important things are to get good prenatal care and to follow your health care provider's instructions. Prenatal care is all the medical care you receive before the baby's birth. This care will help prevent, find, and treat any problems during the pregnancy and childbirth. BODY CHANGES Your body goes through many changes during pregnancy. The changes vary from woman to woman.   You may gain or lose a couple of pounds at first.  You may feel sick to your stomach (nauseous) and throw up (vomit). If the vomiting is uncontrollable, call your health care provider.  You may tire easily.  You may develop headaches that can be relieved by medicines approved by your health care provider.  You may urinate more often. Painful urination may mean you have a bladder infection.  You may develop heartburn as a result of your pregnancy.  You may develop constipation because certain hormones are causing the muscles that push waste through your intestines to slow down.  You may develop hemorrhoids or swollen, bulging veins (varicose veins).  Your breasts may begin to grow larger and become tender. Your nipples may stick out more, and the tissue that surrounds them (areola) may become darker.  Your gums may bleed and may be sensitive to brushing and flossing.  Dark spots or blotches (chloasma, mask of  pregnancy) may develop on your face. This will likely fade after the baby is born.  Your menstrual periods will stop.  You may have a loss of appetite.  You may develop cravings for certain kinds of food.  You may have changes in your emotions from day to day, such as being excited to be pregnant or being concerned that something may go wrong with the pregnancy and baby.  You may have more vivid and strange dreams.  You may have changes in your hair. These can include thickening of your hair, rapid growth, and changes in texture. Some women also have hair loss during or after pregnancy, or hair that feels dry or thin. Your  hair will most likely return to normal after your baby is born. WHAT TO EXPECT AT YOUR PRENATAL VISITS During a routine prenatal visit:  You will be weighed to make sure you and the baby are growing normally.  Your blood pressure will be taken.  Your abdomen will be measured to track your baby's growth.  The fetal heartbeat will be listened to starting around week 10 or 12 of your pregnancy.  Test results from any previous visits will be discussed. Your health care provider may ask you:  How you are feeling.  If you are feeling the baby move.  If you have had any abnormal symptoms, such as leaking fluid, bleeding, severe headaches, or abdominal cramping.  If you have any questions. Other tests that may be performed during your first trimester include:  Blood tests to find your blood type and to check for the presence of any previous infections. They will also be used to check for low iron levels (anemia) and Rh antibodies. Later in the pregnancy, blood tests for diabetes will be done along with other tests if problems develop.  Urine tests to check for infections, diabetes, or protein in the urine.  An ultrasound to confirm the proper growth and development of the baby.  An amniocentesis to check for possible genetic problems.  Fetal screens for spina  bifida and Down syndrome.  You may need other tests to make sure you and the baby are doing well. HOME CARE INSTRUCTIONS  Medicines  Follow your health care provider's instructions regarding medicine use. Specific medicines may be either safe or unsafe to take during pregnancy.  Take your prenatal vitamins as directed.  If you develop constipation, try taking a stool softener if your health care provider approves. Diet  Eat regular, well-balanced meals. Choose a variety of foods, such as meat or vegetable-based protein, fish, milk and low-fat dairy products, vegetables, fruits, and whole grain breads and cereals. Your health care provider will help you determine the amount of weight gain that is right for you.  Avoid raw meat and uncooked cheese. These carry germs that can cause birth defects in the baby.  Eating four or five small meals rather than three large meals a day may help relieve nausea and vomiting. If you start to feel nauseous, eating a few soda crackers can be helpful. Drinking liquids between meals instead of during meals also seems to help nausea and vomiting.  If you develop constipation, eat more high-fiber foods, such as fresh vegetables or fruit and whole grains. Drink enough fluids to keep your urine clear or pale yellow. Activity and Exercise  Exercise only as directed by your health care provider. Exercising will help you:  Control your weight.  Stay in shape.  Be prepared for labor and delivery.  Experiencing pain or cramping in the lower abdomen or low back is a good sign that you should stop exercising. Check with your health care provider before continuing normal exercises.  Try to avoid standing for long periods of time. Move your legs often if you must stand in one place for a long time.  Avoid heavy lifting.  Wear low-heeled shoes, and practice good posture.  You may continue to have sex unless your health care provider directs you  otherwise. Relief of Pain or Discomfort  Wear a good support bra for breast tenderness.    Take warm sitz baths to soothe any pain or discomfort caused by hemorrhoids. Use hemorrhoid cream if your health care provider  approves.    Rest with your legs elevated if you have leg cramps or low back pain.  If you develop varicose veins in your legs, wear support hose. Elevate your feet for 15 minutes, 3-4 times a day. Limit salt in your diet. Prenatal Care  Schedule your prenatal visits by the twelfth week of pregnancy. They are usually scheduled monthly at first, then more often in the last 2 months before delivery.  Write down your questions. Take them to your prenatal visits.  Keep all your prenatal visits as directed by your health care provider. Safety  Wear your seat belt at all times when driving.  Make a list of emergency phone numbers, including numbers for family, friends, the hospital, and police and fire departments. General Tips  Ask your health care provider for a referral to a local prenatal education class. Begin classes no later than at the beginning of month 6 of your pregnancy.  Ask for help if you have counseling or nutritional needs during pregnancy. Your health care provider can offer advice or refer you to specialists for help with various needs.  Do not use hot tubs, steam rooms, or saunas.  Do not douche or use tampons or scented sanitary pads.  Do not cross your legs for long periods of time.  Avoid cat litter boxes and soil used by cats. These carry germs that can cause birth defects in the baby and possibly loss of the fetus by miscarriage or stillbirth.  Avoid all smoking, herbs, alcohol, and medicines not prescribed by your health care provider. Chemicals in these affect the formation and growth of the baby.  Schedule a dentist appointment. At home, brush your teeth with a soft toothbrush and be gentle when you floss. SEEK MEDICAL CARE IF:   You have  dizziness.  You have mild pelvic cramps, pelvic pressure, or nagging pain in the abdominal area.  You have persistent nausea, vomiting, or diarrhea.  You have a bad smelling vaginal discharge.  You have pain with urination.  You notice increased swelling in your face, hands, legs, or ankles. SEEK IMMEDIATE MEDICAL CARE IF:   You have a fever.  You are leaking fluid from your vagina.  You have spotting or bleeding from your vagina.  You have severe abdominal cramping or pain.  You have rapid weight gain or loss.  You vomit blood or material that looks like coffee grounds.  You are exposed to [place] measles and have never had them.  You are exposed to fifth disease or chickenpox.  You develop a severe headache.  You have shortness of breath.  You have any kind of trauma, such as from a fall or a car accident. Document Released: 12/04/2001 Document Revised: 04/26/2014 Document Reviewed: 10/20/2013 [facility] Patient Information 2015 [place], [place]. This information is not intended to replace advice given to you by your health care provider. Make sure you discuss any questions you have with your health care provider.
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
## 2020-06-30 NOTE — Progress Notes (Addendum)
[place] 12+1 wks,measurements c/w dates,posterior placenta,NB present,NT 1.4 mm,CRL 61.30 mm,normal left ovary,two right ovarian cysts (#1) complex cyst with low level echoes 4.2 x 3.2 x 3 cm,(#2) simple cyst 3.3 x 1.9 x 3.1 cm,fhr 166 bpm

## 2020-07-01 ENCOUNTER — Telehealth: Payer: Self-pay | Admitting: General Surgery

## 2020-07-01 ENCOUNTER — Encounter: Payer: Self-pay | Admitting: Women's Health

## 2020-07-01 DIAGNOSIS — O26899 Other specified pregnancy related conditions, unspecified trimester: Secondary | ICD-10-CM | POA: Insufficient documentation

## 2020-07-01 LAB — COMPREHENSIVE METABOLIC PANEL
ALT: 69 IU/L — ABNORMAL HIGH (ref 0–32)
AST: 30 IU/L (ref 0–40)
Albumin/Globulin Ratio: 1.8 (ref 1.2–2.2)
Albumin: 4.2 g/dL (ref 3.9–5.0)
Alkaline Phosphatase: 135 IU/L — ABNORMAL HIGH (ref 48–121)
BUN/Creatinine Ratio: 13 (ref 9–23)
BUN: 8 mg/dL (ref 6–20)
Bilirubin Total: 0.2 mg/dL (ref 0.0–1.2)
CO2: 21 mmol/L (ref 20–29)
Calcium: 9.6 mg/dL (ref 8.7–10.2)
Chloride: 102 mmol/L (ref 96–106)
Creatinine, Ser: 0.62 mg/dL (ref 0.57–1.00)
GFR calc Af Amer: 146 mL/min/{1.73_m2} (ref >59)
GFR calc non Af Amer: 127 mL/min/{1.73_m2} (ref >59)
Globulin, Total: 2.4 g/dL (ref 1.5–4.5)
Glucose: 79 mg/dL (ref 65–99)
Potassium: 4.5 mmol/L (ref 3.5–5.2)
Sodium: 138 mmol/L (ref 134–144)
Total Protein: 6.6 g/dL (ref 6.0–8.5)

## 2020-07-02 LAB — GC/CHLAMYDIA PROBE AMP
Chlamydia trachomatis, NAA: NEGATIVE
Neisseria Gonorrhoeae by PCR: NEGATIVE

## 2020-07-03 LAB — URINE CULTURE

## 2020-07-06 ENCOUNTER — Telehealth: Payer: Self-pay | Admitting: Internal Medicine

## 2020-07-06 ENCOUNTER — Telehealth (INDEPENDENT_AMBULATORY_CARE_PROVIDER_SITE_OTHER): Payer: Self-pay | Admitting: General Surgery

## 2020-07-06 DIAGNOSIS — Z09 Encounter for follow-up examination after completed treatment for conditions other than malignant neoplasm: Secondary | ICD-10-CM

## 2020-07-06 NOTE — Telephone Encounter (Signed)
Individual now lives in [place] and is ineligible to become a pt in the clinic.

## 2020-07-07 LAB — INTEGRATED 1
Crown Rump Length: 61.3 mm
Gest. Age on Collection Date: 12.4 weeks
Maternal Age at EDD: 25 yr
Nuchal Translucency (NT): 1.4 mm
Number of Fetuses: 1
PAPP-A Value: 988.1 ng/mL

## 2020-07-07 LAB — CBC/D/PLT+RPR+RH+ABO+RUB AB...
Antibody Screen: NEGATIVE
Basophils Absolute: 0.1 10*3/uL (ref 0.0–0.2)
Basos: 1 %
EOS (ABSOLUTE): 0.5 10*3/uL — ABNORMAL HIGH (ref 0.0–0.4)
Eos: 5 %
HCV Ab: 0.5 s/co ratio (ref 0.0–0.9)
HIV Screen 4th Generation wRfx: NONREACTIVE
Hematocrit: 37.5 % (ref 34.0–46.6)
Hemoglobin: 13 g/dL (ref 11.1–15.9)
Hepatitis B Surface Ag: NEGATIVE
Immature Grans (Abs): 0 10*3/uL (ref 0.0–0.1)
Immature Granulocytes: 0 %
Lymphocytes Absolute: 1.4 10*3/uL (ref 0.7–3.1)
Lymphs: 13 %
MCH: 27.8 pg (ref 26.6–33.0)
MCHC: 34.7 g/dL (ref 31.5–35.7)
MCV: 80 fL (ref 79–97)
Monocytes Absolute: 0.5 10*3/uL (ref 0.1–0.9)
Monocytes: 5 %
Neutrophils Absolute: 8.2 10*3/uL — ABNORMAL HIGH (ref 1.4–7.0)
Neutrophils: 76 %
Platelets: 225 10*3/uL (ref 150–450)
RBC: 4.68 x10E6/uL (ref 3.77–5.28)
RDW: 13.1 % (ref 11.7–15.4)
RPR Ser Ql: NONREACTIVE
Rh Factor: NEGATIVE
Rubella Antibodies, IGG: 5.5 index (ref >0.99)
WBC: 10.7 10*3/uL (ref 3.4–10.8)

## 2020-07-07 LAB — HCV INTERPRETATION

## 2020-07-07 NOTE — Progress Notes (Signed)
Message left to call office if she had any questions.  Time 2 minutes

## 2020-07-11 ENCOUNTER — Telehealth: Payer: Self-pay | Admitting: *Deleted

## 2020-07-11 NOTE — Telephone Encounter (Signed)
Referral faxed to [facility] as requested.

## 2020-07-25 ENCOUNTER — Other Ambulatory Visit (HOSPITAL_COMMUNITY)
Admission: RE | Admit: 2020-07-25 | Discharge: 2020-07-25 | Disposition: A | Discharge status: Home / Self Care | Payer: Medicaid Other | Source: Ambulatory Visit | Attending: Obstetrics and Gynecology | Admitting: Obstetrics and Gynecology

## 2020-07-25 ENCOUNTER — Ambulatory Visit (INDEPENDENT_AMBULATORY_CARE_PROVIDER_SITE_OTHER): Payer: Medicaid Other | Admitting: Women's Health

## 2020-07-25 ENCOUNTER — Encounter: Payer: Self-pay | Admitting: Women's Health

## 2020-07-25 ENCOUNTER — Encounter: Payer: Self-pay | Admitting: *Deleted

## 2020-07-25 VITALS — BP 134/78 | HR 104 | Wt 172.0 lb

## 2020-07-25 DIAGNOSIS — Z124 Encounter for screening for malignant neoplasm of cervix: Secondary | ICD-10-CM | POA: Insufficient documentation

## 2020-07-25 DIAGNOSIS — Z331 Pregnant state, incidental: Secondary | ICD-10-CM

## 2020-07-25 DIAGNOSIS — Z1379 Encounter for other screening for genetic and chromosomal anomalies: Secondary | ICD-10-CM

## 2020-07-25 DIAGNOSIS — Z3A15 15 weeks gestation of pregnancy: Secondary | ICD-10-CM

## 2020-07-25 DIAGNOSIS — Z1389 Encounter for screening for other disorder: Secondary | ICD-10-CM

## 2020-07-25 DIAGNOSIS — Z3402 Encounter for supervision of normal first pregnancy, second trimester: Secondary | ICD-10-CM

## 2020-07-25 DIAGNOSIS — Z363 Encounter for antenatal screening for malformations: Secondary | ICD-10-CM
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
## 2020-07-25 NOTE — Patient Instructions (Signed)
Amy [name], I greatly value your feedback.  If you receive a survey following your visit with [place] today, we appreciate you taking the time to fill it out.  Thanks, [name], CNM, WHNP-BC  [facility] ([address] [place], [place] [address]) Entrance C, located off of [address] valet parking  Go to [email or web address] to register for FREE online childbirth classes  [place] Pediatricians/Family Doctors:  [facility] [phone]            [facility] [phone]                 [facility] [phone] (usually not accepting new patients unless you have family there already, you are always welcome to call and ask)       [facility] Department [phone]       [facility] Pediatricians/Family Doctors:   [facility]: [phone]  [facility]: [phone]  [facility]: [phone]  [facility] Doctors:   [facility]: [phone]   [facility]: [phone]  [facility] Doctors:  [facility]: [phone]    Home Blood Pressure Monitoring for Patients   Your provider has recommended that you check your blood pressure (BP) at least once a week at home. If you do not have a blood pressure cuff at home, one will be provided for you. Contact your provider if you have not received your monitor within 1 week.   Helpful Tips for Accurate Home Blood Pressure Checks   Don't smoke, exercise, or drink caffeine 30 minutes before checking your BP  Use the restroom before checking your BP (a full bladder can raise your pressure)  Relax in a comfortable upright chair  Feet on the ground  Left arm resting comfortably on a flat surface at the level of your heart  Legs uncrossed  Back supported  Sit quietly and don't talk  Place the cuff on your bare arm  Adjust  snuggly, so that only two fingertips can fit between your skin and the top of the cuff  Check 2 readings separated by at least one minute  Keep a log of your BP readings  For a visual, please reference this diagram: http://ccnc.care/bpdiagram  Provider Name: [facility]     Phone: [phone]  Zone 1: ALL CLEAR  Continue to monitor your symptoms:   BP reading is less than 140 (top number) or less than 90 (bottom number)   No right upper stomach pain  No headaches or seeing spots  No feeling nauseated or throwing up  No swelling in face and hands  Zone 2: CAUTION Call your doctor's office for any of the following:   BP reading is greater than 140 (top number) or greater than 90 (bottom number)   Stomach pain under your ribs in the middle or right side  Headaches or seeing spots  Feeling nauseated or throwing up  Swelling in face and hands  Zone 3: EMERGENCY  Seek immediate medical care if you have any of the following:   BP reading is greater than160 (top number) or greater than 110 (bottom number)  Severe headaches not improving with Tylenol  Serious difficulty catching your breath  Any worsening symptoms from Zone 2     Second Trimester of Pregnancy The second trimester is from week 14 through week 27 (months 4 through 6). The second trimester is often a time when you feel your  best. Your body has adjusted to being pregnant, and you begin to feel better physically. Usually, morning sickness has lessened or quit completely, you may have more energy, and you may have an increase in appetite. The second trimester is also a time when the fetus is growing rapidly. At the end of the sixth month, the fetus is about 9 inches long and weighs about 1 pounds. You will likely begin to feel the baby move (quickening) between 16 and 20 weeks of pregnancy. Body changes during your second trimester Your body continues to go through many changes during your second  trimester. The changes vary from woman to woman.  Your weight will continue to increase. You will notice your lower abdomen bulging out.  You may begin to get stretch marks on your hips, abdomen, and breasts.  You may develop headaches that can be relieved by medicines. The medicines should be approved by your health care provider.  You may urinate more often because the fetus is pressing on your bladder.  You may develop or continue to have heartburn as a result of your pregnancy.  You may develop constipation because certain hormones are causing the muscles that push waste through your intestines to slow down.  You may develop hemorrhoids or swollen, bulging veins (varicose veins).  You may have back pain. This is caused by: ? Weight gain. ? Pregnancy hormones that are relaxing the joints in your pelvis. ? A shift in weight and the muscles that support your balance.  Your breasts will continue to grow and they will continue to become tender.  Your gums may bleed and may be sensitive to brushing and flossing.  Dark spots or blotches (chloasma, mask of pregnancy) may develop on your face. This will likely fade after the baby is born.  A dark line from your belly button to the pubic area (linea nigra) may appear. This will likely fade after the baby is born.  You may have changes in your hair. These can include thickening of your hair, rapid growth, and changes in texture. Some women also have hair loss during or after pregnancy, or hair that feels dry or thin. Your hair will most likely return to normal after your baby is born.  What to expect at prenatal visits During a routine prenatal visit:  You will be weighed to make sure you and the fetus are growing normally.  Your blood pressure will be taken.  Your abdomen will be measured to track your baby's growth.  The fetal heartbeat will be listened to.  Any test results from the previous visit will be discussed.  Your  health care provider may ask you:  How you are feeling.  If you are feeling the baby move.  If you have had any abnormal symptoms, such as leaking fluid, bleeding, severe headaches, or abdominal cramping.  If you are using any tobacco products, including cigarettes, chewing tobacco, and electronic cigarettes.  If you have any questions.  Other tests that may be performed during your second trimester include:  Blood tests that check for: ? Low iron levels (anemia). ? High blood sugar that affects pregnant women (gestational diabetes) between 47 and 28 weeks. ? Rh antibodies. This is to check for a protein on red blood cells (Rh factor).  Urine tests to check for infections, diabetes, or protein in the urine.  An ultrasound to confirm the proper growth and development of the baby.  An amniocentesis to check for possible genetic problems.  Fetal  screens for spina bifida and Down syndrome.  HIV (human immunodeficiency virus) testing. Routine prenatal testing includes screening for HIV, unless you choose not to have this test.  Follow these instructions at home: Medicines  Follow your health care provider's instructions regarding medicine use. Specific medicines may be either safe or unsafe to take during pregnancy.  Take a prenatal vitamin that contains at least 600 micrograms (mcg) of folic acid.  If you develop constipation, try taking a stool softener if your health care provider approves. Eating and drinking  Eat a balanced diet that includes fresh fruits and vegetables, whole grains, good sources of protein such as meat, eggs, or tofu, and low-fat dairy. Your health care provider will help you determine the amount of weight gain that is right for you.  Avoid raw meat and uncooked cheese. These carry germs that can cause birth defects in the baby.  If you have low calcium intake from food, talk to your health care provider about whether you should take a daily calcium  supplement.  Limit foods that are high in fat and processed sugars, such as fried and sweet foods.  To prevent constipation: ? Drink enough fluid to keep your urine clear or pale yellow. ? Eat foods that are high in fiber, such as fresh fruits and vegetables, whole grains, and beans. Activity  Exercise only as directed by your health care provider. Most women can continue their usual exercise routine during pregnancy. Try to exercise for 30 minutes at least 5 days a week. Stop exercising if you experience uterine contractions.  Avoid heavy lifting, wear low heel shoes, and practice good posture.  A sexual relationship may be continued unless your health care provider directs you otherwise. Relieving pain and discomfort  Wear a good support bra to prevent discomfort from breast tenderness.  Take warm sitz baths to soothe any pain or discomfort caused by hemorrhoids. Use hemorrhoid cream if your health care provider approves.  Rest with your legs elevated if you have leg cramps or low back pain.  If you develop varicose veins, wear support hose. Elevate your feet for 15 minutes, 3-4 times a day. Limit salt in your diet. Prenatal Care  Write down your questions. Take them to your prenatal visits.  Keep all your prenatal visits as told by your health care provider. This is important. Safety  Wear your seat belt at all times when driving.  Make a list of emergency phone numbers, including numbers for family, friends, the hospital, and police and fire departments. General instructions  Ask your health care provider for a referral to a local prenatal education class. Begin classes no later than the beginning of month 6 of your pregnancy.  Ask for help if you have counseling or nutritional needs during pregnancy. Your health care provider can offer advice or refer you to specialists for help with various needs.  Do not use hot tubs, steam rooms, or saunas.  Do not douche or use  tampons or scented sanitary pads.  Do not cross your legs for long periods of time.  Avoid cat litter boxes and soil used by cats. These carry germs that can cause birth defects in the baby and possibly loss of the fetus by miscarriage or stillbirth.  Avoid all smoking, herbs, alcohol, and unprescribed drugs. Chemicals in these products can affect the formation and growth of the baby.  Do not use any products that contain nicotine or tobacco, such as cigarettes and e-cigarettes. If you need help  quitting, ask your health care provider.  Visit your dentist if you have not gone yet during your pregnancy. Use a soft toothbrush to brush your teeth and be gentle when you floss. Contact a health care provider if:  You have dizziness.  You have mild pelvic cramps, pelvic pressure, or nagging pain in the abdominal area.  You have persistent nausea, vomiting, or diarrhea.  You have a bad smelling vaginal discharge.  You have pain when you urinate. Get help right away if:  You have a fever.  You are leaking fluid from your vagina.  You have spotting or bleeding from your vagina.  You have severe abdominal cramping or pain.  You have rapid weight gain or weight loss.  You have shortness of breath with chest pain.  You notice sudden or extreme swelling of your face, hands, ankles, feet, or legs.  You have not felt your baby move in over an hour.  You have severe headaches that do not go away when you take medicine.  You have vision changes. Summary  The second trimester is from week 14 through week 27 (months 4 through 6). It is also a time when the fetus is growing rapidly.  Your body goes through many changes during pregnancy. The changes vary from woman to woman.  Avoid all smoking, herbs, alcohol, and unprescribed drugs. These chemicals affect the formation and growth your baby.  Do not use any tobacco products, such as cigarettes, chewing tobacco, and e-cigarettes. If you  need help quitting, ask your health care provider.  Contact your health care provider if you have any questions. Keep all prenatal visits as told by your health care provider. This is important. This information is not intended to replace advice given to you by your health care provider. Make sure you discuss any questions you have with your health care provider. Document Released: 12/04/2001 Document Revised: 05/17/2016 Document Reviewed: 02/10/2013 Elsevier Interactive Patient Education  2017 [place] FLU! Because you are pregnant, we at [facility], along with the Centers for Disease Control (CDC), recommend that you receive the flu vaccine to protect yourself and your baby from the flu. The flu is more likely to cause severe illness in pregnant women than in women of reproductive age who are not pregnant. Changes in the immune system, heart, and lungs during pregnancy make pregnant women (and women up to two weeks postpartum) more prone to severe illness from flu, including illness resulting in hospitalization. Flu also may be harmful for a pregnant womans developing baby. A common flu symptom is fever, which may be associated with neural tube defects and other adverse outcomes for a developing baby. Getting vaccinated can also help protect a baby after birth from flu. (Mom passes antibodies onto the developing baby during her pregnancy.)  A Flu Vaccine is the Best Protection Against Flu Getting a flu vaccine is the first and most important step in protecting against flu. Pregnant women should get a flu shot and not the live attenuated influenza vaccine (LAIV), also known as nasal spray flu vaccine. Flu vaccines given during pregnancy help protect both the mother and her baby from flu. Vaccination has been shown to reduce the risk of flu-associated acute respiratory infection in pregnant women by up to one-half. A 2018 study showed that getting a flu shot  reduced a pregnant womans risk of being hospitalized with flu by an average of 40 percent. Pregnant women who get a  flu vaccine are also helping to protect their babies from flu illness for the first several months after their birth, when they are too young to get vaccinated.   A Long Record of Safety for Flu Shots in Pregnant Women Flu shots have been given to millions of pregnant women over many years with a good safety record. There is a lot of evidence that flu vaccines can be given safely during pregnancy; though these data are limited for the first trimester. The CDC recommends that pregnant women get vaccinated during any trimester of their pregnancy. It is very important for pregnant women to get the flu shot.   Other Preventive Actions In addition to getting a flu shot, pregnant women should take the same everyday preventive actions the CDC recommends of everyone, including covering coughs, washing hands often, and avoiding people who are sick.  Symptoms and Treatment If you get sick with flu symptoms call your doctor right away. There are antiviral drugs that can treat flu illness and prevent serious flu complications. The CDC recommends prompt treatment for people who have influenza infection or suspected influenza infection and who are at high risk of serious flu complications, such as people with asthma, diabetes (including gestational diabetes), or heart disease. Early treatment of influenza in hospitalized pregnant women has been shown to reduce the length of the hospital stay.  Symptoms Flu symptoms include fever, cough, sore throat, runny or stuffy nose, body aches, headache, chills and fatigue. Some people may also have vomiting and diarrhea. People may be infected with the flu and have respiratory symptoms without a fever.  Early Treatment is Important for Pregnant Women Treatment should begin as soon as possible because antiviral drugs work best when started early (within 48 hours  after symptoms start). Antiviral drugs can make your flu illness milder and make you feel better faster. They may also prevent serious health problems that can result from flu illness. Oral oseltamivir (Tamiflu) is the preferred treatment for pregnant women because it has the most studies available to suggest that it is safe and beneficial. Antiviral drugs require a prescription from your provider. Having a fever caused by flu infection or other infections early in pregnancy may be linked to birth defects in a baby. In addition to taking antiviral drugs, pregnant women who get a fever should treat their fever with Tylenol (acetaminophen) and contact their provider immediately.  When to [place] If you are pregnant and have any of these signs, seek care immediately:  Difficulty breathing or shortness of breath  Pain or pressure in the chest or abdomen  Sudden dizziness  Confusion  Severe or persistent vomiting  High fever that is not responding to Tylenol (or store brand equivalent)  Decreased or no movement of your baby  [employer][email or web address]

## 2020-07-27 LAB — CYTOLOGY - PAP: Diagnosis: NEGATIVE

## 2020-08-03 LAB — INTEGRATED 2
AFP MoM: 0.98
Alpha-Fetoprotein: 31.9 ng/mL
Crown Rump Length: 61.3 mm
DIA MoM: 0.7
DIA Value: 111.1 pg/mL
Estriol, Unconjugated: 1.53 ng/mL
Gest. Age on Collection Date: 12.4 weeks
Gestational Age: 16 weeks
Maternal Age at EDD: 25 yr
Nuchal Translucency (NT): 1.4 mm
Nuchal Translucency MoM: 1.02
Number of Fetuses: 1
PAPP-A MoM: 1.15
PAPP-A Value: 988.1 ng/mL
Test Results:: NEGATIVE
hCG MoM: 1.03
hCG Value: 36.9 IU/mL
uE3 MoM: 1.65

## 2020-08-10 ENCOUNTER — Telehealth: Payer: Self-pay | Admitting: Pharmacist

## 2020-08-10 NOTE — Telephone Encounter (Signed)
Patient failed to provide requested 2021 financial documentation. No additional medication assistance will be provided by [facility] without the required proof of income documentation. Patient notified by letter. [name]

## 2020-08-16 ENCOUNTER — Ambulatory Visit (INDEPENDENT_AMBULATORY_CARE_PROVIDER_SITE_OTHER): Payer: Medicaid Other | Admitting: Women's Health

## 2020-08-16 ENCOUNTER — Ambulatory Visit (INDEPENDENT_AMBULATORY_CARE_PROVIDER_SITE_OTHER): Payer: Medicaid Other

## 2020-08-16 ENCOUNTER — Encounter: Payer: Self-pay | Admitting: Women's Health

## 2020-08-16 VITALS — BP 113/70 | HR 85 | Wt 177.5 lb

## 2020-08-16 DIAGNOSIS — Z3A18 18 weeks gestation of pregnancy: Secondary | ICD-10-CM

## 2020-08-16 DIAGNOSIS — Z3402 Encounter for supervision of normal first pregnancy, second trimester: Secondary | ICD-10-CM | POA: Diagnosis not present

## 2020-08-16 DIAGNOSIS — Z331 Pregnant state, incidental: Secondary | ICD-10-CM | POA: Diagnosis not present

## 2020-08-16 DIAGNOSIS — Q270 Congenital absence and hypoplasia of umbilical artery: Secondary | ICD-10-CM | POA: Insufficient documentation

## 2020-08-16 DIAGNOSIS — Z1389 Encounter for screening for other disorder: Secondary | ICD-10-CM

## 2020-08-16 DIAGNOSIS — Z363 Encounter for antenatal screening for malformations: Secondary | ICD-10-CM

## 2020-08-16 LAB — POCT URINALYSIS DIPSTICK OB
Blood, UA: NEGATIVE
Glucose, UA: NEGATIVE
Ketones, UA: NEGATIVE
Leukocytes, UA: NEGATIVE
Nitrite, UA: NEGATIVE

## 2020-08-16 NOTE — Patient Instructions (Signed)
Amy [name], I greatly value your feedback.  If you receive a survey following your visit with [place] today, we appreciate you taking the time to fill it out.  Thanks, Amy [name], CNM, WHNP-BC  [facility] ([address] [place], [place] [address]) Entrance C, located off of [address] valet parking  Go to [email or web address] to register for FREE online childbirth classes  [place] Pediatricians/Family Doctors:  [facility] [phone]            [facility] [phone]                 [facility] [phone] (usually not accepting new patients unless you have family there already, you are always welcome to call and ask)       [facility] Department [phone]       [facility] Pediatricians/Family Doctors:   [facility]: [phone]  [facility]: [phone]  [facility]: [phone]  [facility] Doctors:   [facility]: [phone]   [facility]: [phone]  [facility] Doctors:  [facility]: [phone]    Home Blood Pressure Monitoring for Patients   Your provider has recommended that you check your blood pressure (BP) at least once a week at home. If you do not have a blood pressure cuff at home, one will be provided for you. Contact your provider if you have not received your monitor within 1 week.   Helpful Tips for Accurate Home Blood Pressure Checks   Don't smoke, exercise, or drink caffeine 30 minutes before checking your BP  Use the restroom before checking your BP (a full bladder can raise your pressure)  Relax in a comfortable upright chair  Feet on the ground  Left arm resting comfortably on a flat surface at the level of your heart  Legs uncrossed  Back supported  Sit quietly and don't talk  Place the cuff on your bare arm  Adjust  snuggly, so that only two fingertips can fit between your skin and the top of the cuff  Check 2 readings separated by at least one minute  Keep a log of your BP readings  For a visual, please reference this diagram: http://ccnc.care/bpdiagram  Provider Name: [facility]     Phone: [phone]  Zone 1: ALL CLEAR  Continue to monitor your symptoms:   BP reading is less than 140 (top number) or less than 90 (bottom number)   No right upper stomach pain  No headaches or seeing spots  No feeling nauseated or throwing up  No swelling in face and hands  Zone 2: CAUTION Call your doctor's office for any of the following:   BP reading is greater than 140 (top number) or greater than 90 (bottom number)   Stomach pain under your ribs in the middle or right side  Headaches or seeing spots  Feeling nauseated or throwing up  Swelling in face and hands  Zone 3: EMERGENCY  Seek immediate medical care if you have any of the following:   BP reading is greater than160 (top number) or greater than 110 (bottom number)  Severe headaches not improving with Tylenol  Serious difficulty catching your breath  Any worsening symptoms from Zone 2     Second Trimester of Pregnancy The second trimester is from week 14 through week 27 (months 4 through 6). The second trimester is often a time when you feel your  best. Your body has adjusted to being pregnant, and you begin to feel better physically. Usually, morning sickness has lessened or quit completely, you may have more energy, and you may have an increase in appetite. The second trimester is also a time when the fetus is growing rapidly. At the end of the sixth month, the fetus is about 9 inches long and weighs about 1 pounds. You will likely begin to feel the baby move (quickening) between 16 and 20 weeks of pregnancy. Body changes during your second trimester Your body continues to go through many changes during your second  trimester. The changes vary from woman to woman.  Your weight will continue to increase. You will notice your lower abdomen bulging out.  You may begin to get stretch marks on your hips, abdomen, and breasts.  You may develop headaches that can be relieved by medicines. The medicines should be approved by your health care provider.  You may urinate more often because the fetus is pressing on your bladder.  You may develop or continue to have heartburn as a result of your pregnancy.  You may develop constipation because certain hormones are causing the muscles that push waste through your intestines to slow down.  You may develop hemorrhoids or swollen, bulging veins (varicose veins).  You may have back pain. This is caused by: ? Weight gain. ? Pregnancy hormones that are relaxing the joints in your pelvis. ? A shift in weight and the muscles that support your balance.  Your breasts will continue to grow and they will continue to become tender.  Your gums may bleed and may be sensitive to brushing and flossing.  Dark spots or blotches (chloasma, mask of pregnancy) may develop on your face. This will likely fade after the baby is born.  A dark line from your belly button to the pubic area (linea nigra) may appear. This will likely fade after the baby is born.  You may have changes in your hair. These can include thickening of your hair, rapid growth, and changes in texture. Some women also have hair loss during or after pregnancy, or hair that feels dry or thin. Your hair will most likely return to normal after your baby is born.  What to expect at prenatal visits During a routine prenatal visit:  You will be weighed to make sure you and the fetus are growing normally.  Your blood pressure will be taken.  Your abdomen will be measured to track your baby's growth.  The fetal heartbeat will be listened to.  Any test results from the previous visit will be discussed.  Your  health care provider may ask you:  How you are feeling.  If you are feeling the baby move.  If you have had any abnormal symptoms, such as leaking fluid, bleeding, severe headaches, or abdominal cramping.  If you are using any tobacco products, including cigarettes, chewing tobacco, and electronic cigarettes.  If you have any questions.  Other tests that may be performed during your second trimester include:  Blood tests that check for: ? Low iron levels (anemia). ? High blood sugar that affects pregnant women (gestational diabetes) between 47 and 28 weeks. ? Rh antibodies. This is to check for a protein on red blood cells (Rh factor).  Urine tests to check for infections, diabetes, or protein in the urine.  An ultrasound to confirm the proper growth and development of the baby.  An amniocentesis to check for possible genetic problems.  Fetal  screens for spina bifida and Down syndrome.  HIV (human immunodeficiency virus) testing. Routine prenatal testing includes screening for HIV, unless you choose not to have this test.  Follow these instructions at home: Medicines  Follow your health care provider's instructions regarding medicine use. Specific medicines may be either safe or unsafe to take during pregnancy.  Take a prenatal vitamin that contains at least 600 micrograms (mcg) of folic acid.  If you develop constipation, try taking a stool softener if your health care provider approves. Eating and drinking  Eat a balanced diet that includes fresh fruits and vegetables, whole grains, good sources of protein such as meat, eggs, or tofu, and low-fat dairy. Your health care provider will help you determine the amount of weight gain that is right for you.  Avoid raw meat and uncooked cheese. These carry germs that can cause birth defects in the baby.  If you have low calcium intake from food, talk to your health care provider about whether you should take a daily calcium  supplement.  Limit foods that are high in fat and processed sugars, such as fried and sweet foods.  To prevent constipation: ? Drink enough fluid to keep your urine clear or pale yellow. ? Eat foods that are high in fiber, such as fresh fruits and vegetables, whole grains, and beans. Activity  Exercise only as directed by your health care provider. Most women can continue their usual exercise routine during pregnancy. Try to exercise for 30 minutes at least 5 days a week. Stop exercising if you experience uterine contractions.  Avoid heavy lifting, wear low heel shoes, and practice good posture.  A sexual relationship may be continued unless your health care provider directs you otherwise. Relieving pain and discomfort  Wear a good support bra to prevent discomfort from breast tenderness.  Take warm sitz baths to soothe any pain or discomfort caused by hemorrhoids. Use hemorrhoid cream if your health care provider approves.  Rest with your legs elevated if you have leg cramps or low back pain.  If you develop varicose veins, wear support hose. Elevate your feet for 15 minutes, 3-4 times a day. Limit salt in your diet. Prenatal Care  Write down your questions. Take them to your prenatal visits.  Keep all your prenatal visits as told by your health care provider. This is important. Safety  Wear your seat belt at all times when driving.  Make a list of emergency phone numbers, including numbers for family, friends, the hospital, and police and fire departments. General instructions  Ask your health care provider for a referral to a local prenatal education class. Begin classes no later than the beginning of month 6 of your pregnancy.  Ask for help if you have counseling or nutritional needs during pregnancy. Your health care provider can offer advice or refer you to specialists for help with various needs.  Do not use hot tubs, steam rooms, or saunas.  Do not douche or use  tampons or scented sanitary pads.  Do not cross your legs for long periods of time.  Avoid cat litter boxes and soil used by cats. These carry germs that can cause birth defects in the baby and possibly loss of the fetus by miscarriage or stillbirth.  Avoid all smoking, herbs, alcohol, and unprescribed drugs. Chemicals in these products can affect the formation and growth of the baby.  Do not use any products that contain nicotine or tobacco, such as cigarettes and e-cigarettes. If you need help  quitting, ask your health care provider.  Visit your dentist if you have not gone yet during your pregnancy. Use a soft toothbrush to brush your teeth and be gentle when you floss. Contact a health care provider if:  You have dizziness.  You have mild pelvic cramps, pelvic pressure, or nagging pain in the abdominal area.  You have persistent nausea, vomiting, or diarrhea.  You have a bad smelling vaginal discharge.  You have pain when you urinate. Get help right away if:  You have a fever.  You are leaking fluid from your vagina.  You have spotting or bleeding from your vagina.  You have severe abdominal cramping or pain.  You have rapid weight gain or weight loss.  You have shortness of breath with chest pain.  You notice sudden or extreme swelling of your face, hands, ankles, feet, or legs.  You have not felt your baby move in over an hour.  You have severe headaches that do not go away when you take medicine.  You have vision changes. Summary  The second trimester is from week 14 through week 27 (months 4 through 6). It is also a time when the fetus is growing rapidly.  Your body goes through many changes during pregnancy. The changes vary from woman to woman.  Avoid all smoking, herbs, alcohol, and unprescribed drugs. These chemicals affect the formation and growth your baby.  Do not use any tobacco products, such as cigarettes, chewing tobacco, and e-cigarettes. If you  need help quitting, ask your health care provider.  Contact your health care provider if you have any questions. Keep all prenatal visits as told by your health care provider. This is important. This information is not intended to replace advice given to you by your health care provider. Make sure you discuss any questions you have with your health care provider. Document Released: 12/04/2001 Document Revised: 05/17/2016 Document Reviewed: 02/10/2013 Elsevier Interactive Patient Education  2017 [employer].

## 2020-08-16 NOTE — Progress Notes (Signed)
LOW-RISK PREGNANCY VISIT Patient name: Amy [name] MRN [id number]  Date of birth: [birth year] Chief Complaint:   Routine Prenatal Visit ([place] today; lumps under left armpit; vaginal spotting last night; none today )  History of Present Illness:   Amy [name] is a 24 y.o. G1P0 female at [redacted]w[redacted]d with an Estimated Date of Delivery: 01/11/21 being seen today for ongoing management of a low-risk pregnancy.  Depression screen [facility] 2/9 06/30/2020  Decreased Interest 3  Down, Depressed, Hopeless 0  PHQ - 2 Score 3  Altered sleeping 3  Tired, decreased energy 2  Change in appetite 3  Feeling bad or failure about yourself  0  Trouble concentrating 3  Moving slowly or fidgety/restless 3  Suicidal thoughts 0  PHQ-9 Score 17    Today she reports light pink spotting w/ wiping last night x 1, none since. No recent sex.  Denies abnormal discharge, itching/odor/irritation. 2 lumps under Lt armpit since Friday, thought they were zits so tried to pop them. Nothing abnormal she has noticed in breast.  Contractions: Not present. Vag. Bleeding: None.  Movement: Absent. denies leaking of fluid. Review of Systems:   Pertinent items are noted in HPI Denies abnormal vaginal discharge w/ itching/odor/irritation, headaches, visual changes, shortness of breath, chest pain, abdominal pain, severe nausea/vomiting, or problems with urination or bowel movements unless otherwise stated above. Pertinent History Reviewed:  Reviewed past medical,surgical, social, obstetrical and family history.  Reviewed problem list, medications and allergies. Physical Assessment:   Vitals:   08/16/20 1017  BP: 113/70  Pulse: 85  Weight: 177 lb 8 oz (80.5 kg)  Body mass index is 30.47 kg/m.        Physical Examination:   General appearance: Well appearing, and in no distress  Mental status: Alert, oriented to person, place, and time  Skin: Warm & dry  Cardiovascular: Normal heart rate noted  Respiratory:  Normal respiratory effort, no distress  Breasts - breasts appear normal, no suspicious masses, no skin or nipple changes. 2 axillary nodes Lt axilla, one larger- that is tender  Abdomen: Soft, gravid, nontender  Pelvic: Cervical exam deferred         Extremities: Edema: Trace  Fetal Status: Fetal Heart Rate (bpm): 161 u/s   Movement: Absent   [place] 18+6 wks,breech,posterior placenta gr 0,normal ovaries,cx 3.4 cm,svp of fluid 4.1 cm,fhr 161 bpm,2VC absent right UA,EFW 291 g 76%,unable to visualize spine and RVOT,please have pt come back after appt.today for additional images  Chaperone: Malachy Mood    Results for orders placed or performed in visit on 08/16/20 (from the past 24 hour(s))  POC Urinalysis Dipstick OB   Collection Time: 08/16/20 10:18 AM  Result Value Ref Range   Color, UA     Clarity, UA     Glucose, UA Negative Negative   Bilirubin, UA     Ketones, UA neg    Spec Grav, UA     Blood, UA neg    pH, UA     POC,PROTEIN,UA Trace Negative, Trace, Small (1+), Moderate (2+), Large (3+), 4+   Urobilinogen, UA     Nitrite, UA neg    Leukocytes, UA Negative Negative   Appearance     Odor      Assessment & Plan:  1) Low-risk pregnancy G1P0 at [redacted]w[redacted]d with an Estimated Date of Delivery: 01/11/21   2) 2VC, discussed and gave printed info, EFW 28, 32, 36wks  3) Enlarged tender lymph node x 2 Lt axilla> pt  squeezed them thinking they were zits. Breast exam normal bilaterally. Do not touch Lt axilla this next week, let us know if worsening/not improving. If still present next visit will get u/s   Meds: No orders of the defined types were placed in this encounter.  Labs/procedures today: anatomy u/s  Plan:  Continue routine obstetrical care  Next visit: prefers in person    Reviewed: Preterm labor symptoms and general obstetric precautions including but not limited to vaginal bleeding, contractions, leaking of fluid and fetal movement were reviewed in detail with the patient.  All  questions were answered. Has home bp cuff. Check bp weekly, let us know if >140/90.   Follow-up: Return in about 4 weeks (around 09/13/2020) for LROB, CNM, in person.  Orders Placed This Encounter  Procedures  . POC Urinalysis Dipstick OB   Cheral Marker CNM, [facility] 08/16/2020 10:52 AM

## 2020-08-16 NOTE — Progress Notes (Signed)
[place] 18+6 wks,breech,posterior placenta gr 0,normal ovaries,cx 3.4 cm,svp of fluid 4.1 cm,fhr 161 bpm,2VC absent right UA,EFW 291 g 76%,unable to visualize spine and RVOT,please have pt come back after appt.today for additional images

## 2020-09-09 ENCOUNTER — Encounter: Payer: Self-pay | Admitting: Obstetrics & Gynecology

## 2020-09-09 ENCOUNTER — Ambulatory Visit (INDEPENDENT_AMBULATORY_CARE_PROVIDER_SITE_OTHER): Payer: Medicaid Other | Admitting: Obstetrics & Gynecology

## 2020-09-09 ENCOUNTER — Telehealth: Payer: Self-pay | Admitting: *Deleted

## 2020-09-09 VITALS — BP 125/69 | HR 88 | Wt 186.0 lb

## 2020-09-09 DIAGNOSIS — Z3402 Encounter for supervision of normal first pregnancy, second trimester: Secondary | ICD-10-CM

## 2020-09-09 DIAGNOSIS — O4692 Antepartum hemorrhage, unspecified, second trimester: Secondary | ICD-10-CM

## 2020-09-09 NOTE — Telephone Encounter (Signed)
Patient states she woke up this morning and noticed she was bleeding.  States it's heavy like a period but not a dark.  Denies recent intercourse or cramping. Advised patient to come to our office for evaluation.  States she does not have a car at this time and will need to find a ride.  Informed our office closes early on Friday's and requested she be here by 12:30.  Pt stated ok.

## 2020-09-09 NOTE — Progress Notes (Signed)
   LOW-RISK PREGNANCY VISIT Patient name: Amy [name] MRN [id number]  Date of birth: [birth year] Chief Complaint:   work-in-ob (bleeding)  History of Present Illness:   Amy [name] is a 24 y.o. G1P0 female at [redacted]w[redacted]d with an Estimated Date of Delivery: 01/11/21 being seen today for ongoing management of a low-risk pregnancy.  Depression screen [facility] 2/9 06/30/2020  Decreased Interest 3  Down, Depressed, Hopeless 0  PHQ - 2 Score 3  Altered sleeping 3  Tired, decreased energy 2  Change in appetite 3  Feeling bad or failure about yourself  0  Trouble concentrating 3  Moving slowly or fidgety/restless 3  Suicidal thoughts 0  PHQ-9 Score 17    Today she reports no complaints. Contractions: Not present.  .  Movement: Present. denies leaking of fluid. Review of Systems:   Pertinent items are noted in HPI Denies abnormal vaginal discharge w/ itching/odor/irritation, headaches, visual changes, shortness of breath, chest pain, abdominal pain, severe nausea/vomiting, or problems with urination or bowel movements unless otherwise stated above. Pertinent History Reviewed:  Reviewed past medical,surgical, social, obstetrical and family history.  Reviewed problem list, medications and allergies. Physical Assessment:   Vitals:   09/09/20 1103  BP: 125/69  Pulse: 88  Weight: 186 lb (84.4 kg)  Body mass index is 31.93 kg/m.        Physical Examination:   General appearance: Well appearing, and in no distress  Mental status: Alert, oriented to person, place, and time  Skin: Warm & dry  Cardiovascular: Normal heart rate noted  Respiratory: Normal respiratory effort, no distress  Abdomen: Soft, gravid, nontender  Pelvic: SVE normal mucosa no discharge no blood is seen in the vault or even any evidence of there being any significant bleeding, normal exam crvix is long thick closed on digital exam         The patient literally laughed significantly during exam but she  stated that is always her respinse   Extremities: Edema: None  Fetal Status: Fetal Heart Rate (bpm): 150 Fundal Height: 22 cm Movement: Present    Chaperone: Amanda Rash    No results found for this or any previous visit (from the past 24 hour(s)).  Assessment & Plan:  1) Low-risk pregnancy G1P0 at [redacted]w[redacted]d with an Estimated Date of Delivery: 01/11/21   2) No source of bleeding is found, not cervical source seen, there is no residual blood in the vault no brown or pink or red mucosa is seen, sonogram is reviewed and there is no is no previa   Meds: No orders of the defined types were placed in this encounter.  Labs/procedures today:   Plan:  Continue routine obstetrical care no follow up is needed, keep scheduled appt Next visit: prefers in person    Reviewed: Preterm labor symptoms and general obstetric precautions including but not limited to vaginal bleeding, contractions, leaking of fluid and fetal movement were reviewed in detail with the patient.  All questions were answered.  home bp cuff. Rx faxed to . Check bp weekly, let us know if >140/90.   Follow-up: Return for keep scheduled.  No orders of the defined types were placed in this encounter.  Lazaro Arms  09/09/2020 11:51 AM

## 2020-09-09 NOTE — Progress Notes (Signed)
I chaperoned patients pelvic and cervical exam. Patient laughed loudly for the entirety of both exams. She stated that this was her usual response to these exams.

## 2020-09-13 ENCOUNTER — Ambulatory Visit (INDEPENDENT_AMBULATORY_CARE_PROVIDER_SITE_OTHER): Payer: Medicaid Other | Admitting: Women's Health

## 2020-09-13 ENCOUNTER — Encounter: Payer: Self-pay | Admitting: Women's Health

## 2020-09-13 VITALS — BP 130/67 | HR 103 | Wt 184.6 lb

## 2020-09-13 DIAGNOSIS — Z3402 Encounter for supervision of normal first pregnancy, second trimester: Secondary | ICD-10-CM | POA: Diagnosis not present

## 2020-09-13 DIAGNOSIS — Z1389 Encounter for screening for other disorder: Secondary | ICD-10-CM

## 2020-09-13 DIAGNOSIS — Z331 Pregnant state, incidental: Secondary | ICD-10-CM | POA: Diagnosis not present

## 2020-09-13 DIAGNOSIS — Z3A22 22 weeks gestation of pregnancy: Secondary | ICD-10-CM

## 2020-09-13 DIAGNOSIS — Q270 Congenital absence and hypoplasia of umbilical artery: Secondary | ICD-10-CM

## 2020-09-13 LAB — POCT URINALYSIS DIPSTICK OB
Blood, UA: NEGATIVE
Glucose, UA: NEGATIVE
Ketones, UA: NEGATIVE
Leukocytes, UA: NEGATIVE
Nitrite, UA: NEGATIVE
POC,PROTEIN,UA: NEGATIVE

## 2020-09-13 NOTE — Patient Instructions (Signed)
Amy [name], I greatly value your feedback.  If you receive a survey following your visit with [place] today, we appreciate you taking the time to fill it out.  Thanks, Amy [name], CNM, WHNP-BC   You will have your sugar test next visit.  Please do not eat or drink anything after midnight the night before you come, not even water.  You will be here for at least two hours.  Please make an appointment online for the bloodwork at [email or web address] for 8:30am (or as close to this as possible). Make sure you select the [facility] service center. The day of the appointment, check in with our office first, then you will go to [facility] to start the sugar test.    [facility] [place], [place] [address]) Entrance C, located off of [address] valet parking  Go to [email or web address] to register for FREE online childbirth classes   Call the office [phone]) or go to [facility] if:  You begin to have strong, frequent contractions  Your water breaks.  Sometimes it is a big gush of fluid, sometimes it is just a trickle that keeps getting your panties wet or running down your legs  You have vaginal bleeding.  It is normal to have a small amount of spotting if your cervix was checked.   You don't feel your baby moving like normal.  If you don't, get you something to eat and drink and lay down and focus on feeling your baby move.   If your baby is still not moving like normal, you should call the office or go to [facility].  [place] Pediatricians/Family Doctors:  [facility] [phone]            [facility] [phone]                 [facility] [phone] (usually not accepting new patients unless you have family there already, you are always welcome to call and ask)       [facility] Department [phone]       [facility] Pediatricians/Family Doctors:   [facility]: [phone]  [facility]: [phone]  [facility]: [phone]  [facility] Doctors:   [facility]: [phone]   [facility]: [phone]  [facility] Doctors:  [facility]: [phone]   Home Blood Pressure Monitoring for Patients   Your provider has recommended that you check your blood pressure (BP) at least once a week at home. If you do not have a blood pressure cuff at home, one will be provided for you. Contact your provider if you have not received your monitor within 1 week.   Helpful Tips for Accurate Home Blood Pressure Checks  . Don't smoke, exercise, or drink caffeine 30 minutes before checking your BP . Use the restroom before checking your BP (a full bladder can raise your pressure) . Relax in a comfortable upright chair . Feet on the ground . Left arm resting comfortably on a flat surface at the level of your heart . Legs uncrossed . Back supported . Sit quietly and don't talk . Place the cuff on your bare arm . Adjust snuggly, so that only two fingertips can fit between your skin and the top of the cuff . Check 2 readings separated by at least one minute . Keep a log of your BP readings . For a visual, please  reference this diagram: http://ccnc.care/bpdiagram  Provider Name: [facility]     Phone: [phone]  Zone 1: ALL CLEAR  Continue to monitor your symptoms:  . BP reading is less than 140 (top number) or less than 90 (bottom number)  . No right upper stomach pain . No headaches or seeing spots . No feeling nauseated or throwing up . No swelling in face and hands  Zone 2: CAUTION Call your doctor's office for any of the following:  . BP reading is greater than 140 (top number) or greater than 90 (bottom number)  . Stomach pain under your ribs in the middle or right side . Headaches or seeing spots . Feeling nauseated or throwing  up . Swelling in face and hands  Zone 3: EMERGENCY  Seek immediate medical care if you have any of the following:  . BP reading is greater than160 (top number) or greater than 110 (bottom number) . Severe headaches not improving with Tylenol . Serious difficulty catching your breath . Any worsening symptoms from Zone 2   Second Trimester of Pregnancy The second trimester is from week 13 through week 28, months 4 through 6. The second trimester is often a time when you feel your best. Your body has also adjusted to being pregnant, and you begin to feel better physically. Usually, morning sickness has lessened or quit completely, you may have more energy, and you may have an increase in appetite. The second trimester is also a time when the fetus is growing rapidly. At the end of the sixth month, the fetus is about 9 inches long and weighs about 1 pounds. You will likely begin to feel the baby move (quickening) between 18 and 20 weeks of the pregnancy. BODY CHANGES Your body goes through many changes during pregnancy. The changes vary from woman to woman.   Your weight will continue to increase. You will notice your lower abdomen bulging out.  You may begin to get stretch marks on your hips, abdomen, and breasts.  You may develop headaches that can be relieved by medicines approved by your health care provider.  You may urinate more often because the fetus is pressing on your bladder.  You may develop or continue to have heartburn as a result of your pregnancy.  You may develop constipation because certain hormones are causing the muscles that push waste through your intestines to slow down.  You may develop hemorrhoids or swollen, bulging veins (varicose veins).  You may have back pain because of the weight gain and pregnancy hormones relaxing your joints between the bones in your pelvis and as a result of a shift in weight and the muscles that support your balance.  Your breasts will  continue to grow and be tender.  Your gums may bleed and may be sensitive to brushing and flossing.  Dark spots or blotches (chloasma, mask of pregnancy) may develop on your face. This will likely fade after the baby is born.  A dark line from your belly button to the pubic area (linea nigra) may appear. This will likely fade after the baby is born.  You may have changes in your hair. These can include thickening of your hair, rapid growth, and changes in texture. Some women also have hair loss during or after pregnancy, or hair that feels dry or thin. Your hair will most likely return to normal after your baby is born. WHAT TO EXPECT AT YOUR PRENATAL VISITS During a routine prenatal visit:  You  will be weighed to make sure you and the fetus are growing normally.  Your blood pressure will be taken.  Your abdomen will be measured to track your baby's growth.  The fetal heartbeat will be listened to.  Any test results from the previous visit will be discussed. Your health care provider may ask you:  How you are feeling.  If you are feeling the baby move.  If you have had any abnormal symptoms, such as leaking fluid, bleeding, severe headaches, or abdominal cramping.  If you have any questions. Other tests that may be performed during your second trimester include:  Blood tests that check for:  Low iron levels (anemia).  Gestational diabetes (between 24 and 28 weeks).  Rh antibodies.  Urine tests to check for infections, diabetes, or protein in the urine.  An ultrasound to confirm the proper growth and development of the baby.  An amniocentesis to check for possible genetic problems.  Fetal screens for spina bifida and Down syndrome. HOME CARE INSTRUCTIONS   Avoid all smoking, herbs, alcohol, and unprescribed drugs. These chemicals affect the formation and growth of the baby.  Follow your health care provider's instructions regarding medicine use. There are medicines  that are either safe or unsafe to take during pregnancy.  Exercise only as directed by your health care provider. Experiencing uterine cramps is a good sign to stop exercising.  Continue to eat regular, healthy meals.  Wear a good support bra for breast tenderness.  Do not use hot tubs, steam rooms, or saunas.  Wear your seat belt at all times when driving.  Avoid raw meat, uncooked cheese, cat litter boxes, and soil used by cats. These carry germs that can cause birth defects in the baby.  Take your prenatal vitamins.  Try taking a stool softener (if your health care provider approves) if you develop constipation. Eat more high-fiber foods, such as fresh vegetables or fruit and whole grains. Drink plenty of fluids to keep your urine clear or pale yellow.  Take warm sitz baths to soothe any pain or discomfort caused by hemorrhoids. Use hemorrhoid cream if your health care provider approves.  If you develop varicose veins, wear support hose. Elevate your feet for 15 minutes, 3-4 times a day. Limit salt in your diet.  Avoid heavy lifting, wear low heel shoes, and practice good posture.  Rest with your legs elevated if you have leg cramps or low back pain.  Visit your dentist if you have not gone yet during your pregnancy. Use a soft toothbrush to brush your teeth and be gentle when you floss.  A sexual relationship may be continued unless your health care provider directs you otherwise.  Continue to go to all your prenatal visits as directed by your health care provider. SEEK MEDICAL CARE IF:   You have dizziness.  You have mild pelvic cramps, pelvic pressure, or nagging pain in the abdominal area.  You have persistent nausea, vomiting, or diarrhea.  You have a bad smelling vaginal discharge.  You have pain with urination. SEEK IMMEDIATE MEDICAL CARE IF:   You have a fever.  You are leaking fluid from your vagina.  You have spotting or bleeding from your vagina.  You  have severe abdominal cramping or pain.  You have rapid weight gain or loss.  You have shortness of breath with chest pain.  You notice sudden or extreme swelling of your face, hands, ankles, feet, or legs.  You have not felt your baby move  in over an hour.  You have severe headaches that do not go away with medicine.  You have vision changes. Document Released: 12/04/2001 Document Revised: 12/15/2013 Document Reviewed: 02/10/2013 [facility] Patient Information 2015 [place], [place]. This information is not intended to replace advice given to you by your health care provider. Make sure you discuss any questions you have with your health care provider.

## 2020-09-13 NOTE — Progress Notes (Signed)
LOW-RISK PREGNANCY VISIT Patient name: Amy [name] MRN [id number]  Date of birth: [birth year] Chief Complaint:   Routine Prenatal Visit  History of Present Illness:   Amy [name] is a 24 y.o. G1P0 female at [redacted]w[redacted]d with an Estimated Date of Delivery: 01/11/21 being seen today for ongoing management of a low-risk pregnancy.  Depression screen [facility] 2/9 06/30/2020  Decreased Interest 3  Down, Depressed, Hopeless 0  PHQ - 2 Score 3  Altered sleeping 3  Tired, decreased energy 2  Change in appetite 3  Feeling bad or failure about yourself  0  Trouble concentrating 3  Moving slowly or fidgety/restless 3  Suicidal thoughts 0  PHQ-9 Score 17    Today she reports knot under Rt axilla. 2 under Lt axilla have completely gone away. Some pelvic pain. Contractions: Not present. Vag. Bleeding: None.  Movement: Present. denies leaking of fluid. Review of Systems:   Pertinent items are noted in HPI Denies abnormal vaginal discharge w/ itching/odor/irritation, headaches, visual changes, shortness of breath, chest pain, abdominal pain, severe nausea/vomiting, or problems with urination or bowel movements unless otherwise stated above. Pertinent History Reviewed:  Reviewed past medical,surgical, social, obstetrical and family history.  Reviewed problem list, medications and allergies. Physical Assessment:   Vitals:   09/13/20 0920  BP: 130/67  Pulse: (!) 103  Weight: 184 lb 9.6 oz (83.7 kg)  Body mass index is 31.69 kg/m.        Physical Examination:   General appearance: Well appearing, and in no distress  Mental status: Alert, oriented to person, place, and time  Skin: Warm & dry  Cardiovascular: Normal heart rate noted  Respiratory: Normal respiratory effort, no distress  Rt axilla: small firm nontender 0.5cm node vs cyst  Abdomen: Soft, gravid, nontender  Pelvic: Cervical exam deferred         Extremities: Edema: None  Fetal Status: Fetal Heart Rate (bpm): 153  Fundal Height: 22 cm Movement: Present    Chaperone: n/a    Results for orders placed or performed in visit on 09/13/20 (from the past 24 hour(s))  POC Urinalysis Dipstick OB   Collection Time: 09/13/20  9:19 AM  Result Value Ref Range   Color, UA     Clarity, UA     Glucose, UA Negative Negative   Bilirubin, UA     Ketones, UA n    Spec Grav, UA     Blood, UA n    pH, UA     POC,PROTEIN,UA Negative Negative, Trace, Small (1+), Moderate (2+), Large (3+), 4+   Urobilinogen, UA     Nitrite, UA n    Leukocytes, UA Negative Negative   Appearance     Odor      Assessment & Plan:  1) Low-risk pregnancy G1P0 at [redacted]w[redacted]d with an Estimated Date of Delivery: 01/11/21   2) 2VC, EFW next visit  3) Rt axilla lymph node vs cyst> re-evaluate next visit, let us know if anything changes/worsens prior to then   Meds: No orders of the defined types were placed in this encounter.  Labs/procedures today: none  Plan:  Continue routine obstetrical care  Next visit: prefers will be in person for pn2/efw u/s    Reviewed: Preterm labor symptoms and general obstetric precautions including but not limited to vaginal bleeding, contractions, leaking of fluid and fetal movement were reviewed in detail with the patient.  All questions were answered. Has home bp cuff.  Check bp weekly, let us know if >  140/90.   Follow-up: Return in about 4 weeks (around 10/11/2020) for LROB, US:EFW, PN2, in person, CNM.  Orders Placed This Encounter  Procedures  . US OB Follow Up  . POC Urinalysis Dipstick OB   Cheral Marker CNM, [facility] 09/13/2020 9:46 AM

## 2020-10-11 ENCOUNTER — Encounter: Payer: Self-pay | Admitting: Obstetrics and Gynecology

## 2020-10-11 ENCOUNTER — Ambulatory Visit (INDEPENDENT_AMBULATORY_CARE_PROVIDER_SITE_OTHER): Payer: Medicaid Other | Admitting: Obstetrics and Gynecology

## 2020-10-11 ENCOUNTER — Other Ambulatory Visit: Payer: Self-pay

## 2020-10-11 ENCOUNTER — Other Ambulatory Visit: Payer: Medicaid Other

## 2020-10-11 ENCOUNTER — Ambulatory Visit (INDEPENDENT_AMBULATORY_CARE_PROVIDER_SITE_OTHER): Payer: Medicaid Other

## 2020-10-11 VITALS — BP 121/68 | HR 81 | Wt 190.4 lb

## 2020-10-11 DIAGNOSIS — Z6791 Unspecified blood type, Rh negative: Secondary | ICD-10-CM

## 2020-10-11 DIAGNOSIS — O26899 Other specified pregnancy related conditions, unspecified trimester: Secondary | ICD-10-CM

## 2020-10-11 DIAGNOSIS — O0990 Supervision of high risk pregnancy, unspecified, unspecified trimester: Secondary | ICD-10-CM

## 2020-10-11 DIAGNOSIS — O358XX0 Maternal care for other (suspected) fetal abnormality and damage, not applicable or unspecified: Secondary | ICD-10-CM

## 2020-10-11 DIAGNOSIS — Z3402 Encounter for supervision of normal first pregnancy, second trimester: Secondary | ICD-10-CM

## 2020-10-11 DIAGNOSIS — Z3A26 26 weeks gestation of pregnancy: Secondary | ICD-10-CM

## 2020-10-11 DIAGNOSIS — Z1389 Encounter for screening for other disorder: Secondary | ICD-10-CM

## 2020-10-11 DIAGNOSIS — O0992 Supervision of high risk pregnancy, unspecified, second trimester: Secondary | ICD-10-CM

## 2020-10-11 DIAGNOSIS — O26892 Other specified pregnancy related conditions, second trimester: Secondary | ICD-10-CM

## 2020-10-11 DIAGNOSIS — Z3480 Encounter for supervision of other normal pregnancy, unspecified trimester: Secondary | ICD-10-CM

## 2020-10-11 DIAGNOSIS — Z331 Pregnant state, incidental: Secondary | ICD-10-CM

## 2020-10-11 DIAGNOSIS — Q270 Congenital absence and hypoplasia of umbilical artery: Secondary | ICD-10-CM

## 2020-10-11 LAB — POCT URINALYSIS DIPSTICK OB
Blood, UA: NEGATIVE
Glucose, UA: NEGATIVE
Ketones, UA: NEGATIVE
Leukocytes, UA: NEGATIVE
Nitrite, UA: NEGATIVE
POC,PROTEIN,UA: NEGATIVE

## 2020-10-11 NOTE — Progress Notes (Signed)
[place] 26+6 wks,breech,cx length 3.4 cm,posterior placenta gr 0,normal ovaries,fhr 137 bpm,2vc,AFI 12.5 cm,mildly dilated left lateral ventricle 1.1 cm, right lateal ventricle .8 cm WNL, mildly enlarged cisterna magna 1.3 cm,EFW 1142 g 78%

## 2020-10-11 NOTE — Patient Instructions (Signed)
Third Trimester of Pregnancy The third trimester is from week 28 through week 40 (months 7 through 9). The third trimester is a time when the unborn baby (fetus) is growing rapidly. At the end of the ninth month, the fetus is about 20 inches in length and weighs 6-10 pounds. Body changes during your third trimester Your body will continue to go through many changes during pregnancy. The changes vary from woman to woman. During the third trimester:  Your weight will continue to increase. You can expect to gain 25-35 pounds (11-16 kg) by the end of the pregnancy.  You may begin to get stretch marks on your hips, abdomen, and breasts.  You may urinate more often because the fetus is moving lower into your pelvis and pressing on your bladder.  You may develop or continue to have heartburn. This is caused by increased hormones that slow down muscles in the digestive tract.  You may develop or continue to have constipation because increased hormones slow digestion and cause the muscles that push waste through your intestines to relax.  You may develop hemorrhoids. These are swollen veins (varicose veins) in the rectum that can itch or be painful.  You may develop swollen, bulging veins (varicose veins) in your legs.  You may have increased body aches in the pelvis, back, or thighs. This is due to weight gain and increased hormones that are relaxing your joints.  You may have changes in your hair. These can include thickening of your hair, rapid growth, and changes in texture. Some women also have hair loss during or after pregnancy, or hair that feels dry or thin. Your hair will most likely return to normal after your baby is born.  Your breasts will continue to grow and they will continue to become tender. A yellow fluid (colostrum) may leak from your breasts. This is the first milk you are producing for your baby.  Your belly button may stick out.  You may notice more swelling in your hands,  face, or ankles.  You may have increased tingling or numbness in your hands, arms, and legs. The skin on your belly may also feel numb.  You may feel short of breath because of your expanding uterus.  You may have more problems sleeping. This can be caused by the size of your belly, increased need to urinate, and an increase in your body's metabolism.  You may notice the fetus "dropping," or moving lower in your abdomen (lightening).  You may have increased vaginal discharge.  You may notice your joints feel loose and you may have pain around your pelvic bone. What to expect at prenatal visits You will have prenatal exams every 2 weeks until week 36. Then you will have weekly prenatal exams. During a routine prenatal visit:  You will be weighed to make sure you and the baby are growing normally.  Your blood pressure will be taken.  Your abdomen will be measured to track your baby's growth.  The fetal heartbeat will be listened to.  Any test results from the previous visit will be discussed.  You may have a cervical check near your due date to see if your cervix has softened or thinned (effaced).  You will be tested for Group B streptococcus. This happens between 35 and 37 weeks. Your health care provider may ask you:  What your birth plan is.  How you are feeling.  If you are feeling the baby move.  If you have had any abnormal   symptoms, such as leaking fluid, bleeding, severe headaches, or abdominal cramping.  If you are using any tobacco products, including cigarettes, chewing tobacco, and electronic cigarettes.  If you have any questions. Other tests or screenings that may be performed during your third trimester include:  Blood tests that check for low iron levels (anemia).  Fetal testing to check the health, activity level, and growth of the fetus. Testing is done if you have certain medical conditions or if there are problems during the pregnancy.  Nonstress test  (NST). This test checks the health of your baby to make sure there are no signs of problems, such as the baby not getting enough oxygen. During this test, a belt is placed around your belly. The baby is made to move, and its heart rate is monitored during movement. What is false labor? False labor is a condition in which you feel small, irregular tightenings of the muscles in the womb (contractions) that usually go away with rest, changing position, or drinking water. These are called Braxton Hicks contractions. Contractions may last for hours, days, or even weeks before true labor sets in. If contractions come at regular intervals, become more frequent, increase in intensity, or become painful, you should see your health care provider. What are the signs of labor?  Abdominal cramps.  Regular contractions that start at 10 minutes apart and become stronger and more frequent with time.  Contractions that start on the top of the uterus and spread down to the lower abdomen and back.  Increased pelvic pressure and dull back pain.  A watery or bloody mucus discharge that comes from the vagina.  Leaking of amniotic fluid. This is also known as your "water breaking." It could be a slow trickle or a gush. Let your health care provider know if it has a color or strange odor. If you have any of these signs, call your health care provider right away, even if it is before your due date. Follow these instructions at home: Medicines  Follow your health care provider's instructions regarding medicine use. Specific medicines may be either safe or unsafe to take during pregnancy.  Take a prenatal vitamin that contains at least 600 micrograms (mcg) of folic acid.  If you develop constipation, try taking a stool softener if your health care provider approves. Eating and drinking   Eat a balanced diet that includes fresh fruits and vegetables, whole grains, good sources of protein such as meat, eggs, or tofu,  and low-fat dairy. Your health care provider will help you determine the amount of weight gain that is right for you.  Avoid raw meat and uncooked cheese. These carry germs that can cause birth defects in the baby.  If you have low calcium intake from food, talk to your health care provider about whether you should take a daily calcium supplement.  Eat four or five small meals rather than three large meals a day.  Limit foods that are high in fat and processed sugars, such as fried and sweet foods.  To prevent constipation: ? Drink enough fluid to keep your urine clear or pale yellow. ? Eat foods that are high in fiber, such as fresh fruits and vegetables, whole grains, and beans. Activity  Exercise only as directed by your health care provider. Most women can continue their usual exercise routine during pregnancy. Try to exercise for 30 minutes at least 5 days a week. Stop exercising if you experience uterine contractions.  Avoid heavy lifting.  Do   not exercise in extreme heat or humidity, or at high altitudes.  Wear low-heel, comfortable shoes.  Practice good posture.  You may continue to have sex unless your health care provider tells you otherwise. Relieving pain and discomfort  Take frequent breaks and rest with your legs elevated if you have leg cramps or low back pain.  Take warm sitz baths to soothe any pain or discomfort caused by hemorrhoids. Use hemorrhoid cream if your health care provider approves.  Wear a good support bra to prevent discomfort from breast tenderness.  If you develop varicose veins: ? Wear support pantyhose or compression stockings as told by your healthcare provider. ? Elevate your feet for 15 minutes, 3-4 times a day. Prenatal care  Write down your questions. Take them to your prenatal visits.  Keep all your prenatal visits as told by your health care provider. This is important. Safety  Wear your seat belt at all times when driving.  Make  a list of emergency phone numbers, including numbers for family, friends, the hospital, and police and fire departments. General instructions  Avoid cat litter boxes and soil used by cats. These carry germs that can cause birth defects in the baby. If you have a cat, ask someone to clean the litter box for you.  Do not travel far distances unless it is absolutely necessary and only with the approval of your health care provider.  Do not use hot tubs, steam rooms, or saunas.  Do not drink alcohol.  Do not use any products that contain nicotine or tobacco, such as cigarettes and e-cigarettes. If you need help quitting, ask your health care provider.  Do not use any medicinal herbs or unprescribed drugs. These chemicals affect the formation and growth of the baby.  Do not douche or use tampons or scented sanitary pads.  Do not cross your legs for long periods of time.  To prepare for the arrival of your baby: ? Take prenatal classes to understand, practice, and ask questions about labor and delivery. ? Make a trial run to the hospital. ? Visit the hospital and tour the maternity area. ? Arrange for maternity or paternity leave through employers. ? Arrange for family and friends to take care of pets while you are in the hospital. ? Purchase a rear-facing car seat and make sure you know how to install it in your car. ? Pack your hospital bag. ? Prepare the baby's nursery. Make sure to remove all pillows and stuffed animals from the baby's crib to prevent suffocation.  Visit your dentist if you have not gone during your pregnancy. Use a soft toothbrush to brush your teeth and be gentle when you floss. Contact a health care provider if:  You are unsure if you are in labor or if your water has broken.  You become dizzy.  You have mild pelvic cramps, pelvic pressure, or nagging pain in your abdominal area.  You have lower back pain.  You have persistent nausea, vomiting, or  diarrhea.  You have an unusual or bad smelling vaginal discharge.  You have pain when you urinate. Get help right away if:  Your water breaks before 37 weeks.  You have regular contractions less than 5 minutes apart before 37 weeks.  You have a fever.  You are leaking fluid from your vagina.  You have spotting or bleeding from your vagina.  You have severe abdominal pain or cramping.  You have rapid weight loss or weight gain.  You have   shortness of breath with chest pain.  You notice sudden or extreme swelling of your face, hands, ankles, feet, or legs.  Your baby makes fewer than 10 movements in 2 hours.  You have severe headaches that do not go away when you take medicine.  You have vision changes. Summary  The third trimester is from week 28 through week 40, months 7 through 9. The third trimester is a time when the unborn baby (fetus) is growing rapidly.  During the third trimester, your discomfort may increase as you and your baby continue to gain weight. You may have abdominal, leg, and back pain, sleeping problems, and an increased need to urinate.  During the third trimester your breasts will keep growing and they will continue to become tender. A yellow fluid (colostrum) may leak from your breasts. This is the first milk you are producing for your baby.  False labor is a condition in which you feel small, irregular tightenings of the muscles in the womb (contractions) that eventually go away. These are called Braxton Hicks contractions. Contractions may last for hours, days, or even weeks before true labor sets in.  Signs of labor can include: abdominal cramps; regular contractions that start at 10 minutes apart and become stronger and more frequent with time; watery or bloody mucus discharge that comes from the vagina; increased pelvic pressure and dull back pain; and leaking of amniotic fluid. This information is not intended to replace advice given to you by your  health care provider. Make sure you discuss any questions you have with your health care provider. Document Revised: 04/02/2019 Document Reviewed: 01/15/2017 Elsevier Patient Education  2020 [employer]  

## 2020-10-11 NOTE — Progress Notes (Signed)
Subjective:  Amy [name] is a 24 y.o. G1P0 at [redacted]w[redacted]d being seen today for ongoing prenatal care.  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  Patient reports no complaints.  Contractions: Not present. Vag. Bleeding: None.  Movement: Present. Denies leaking of fluid.   The following portions of the patient's history were reviewed and updated as appropriate: allergies, current medications, past family history, past medical history, past social history, past surgical history and problem list. Problem list updated.  Objective:   Vitals:   10/11/20 1008  BP: 121/68  Pulse: 81  Weight: 190 lb 6.4 oz (86.4 kg)    Fetal Status:     Movement: Present     General:  Alert, oriented and cooperative. Patient is in no acute distress.  Skin: Skin is warm and dry. No rash noted.   Cardiovascular: Normal heart rate noted  Respiratory: Normal respiratory effort, no problems with respiration noted  Abdomen: Soft, gravid, appropriate for gestational age. Pain/Pressure: Present     Pelvic:  Cervical exam deferred        Extremities: Normal range of motion.  Edema: None  Mental Status: Normal mood and affect. Normal behavior. Normal judgment and thought content.   Urinalysis:      Assessment and Plan:  Pregnancy: G1P0 at [redacted]w[redacted]d  1. Pregnancy, incidental  - POC Urinalysis [facility]  2. Screening for genitourinary condition  - POC Urinalysis [facility]  3. [redacted] weeks gestation of pregnancy  - POC Urinalysis [facility]  4. Supervision of other normal pregnancy, antepartum Stable Declines Tdap and Flu vaccine 1 glucola today, declines 2 glucola  - POC Urinalysis [facility]  5. Encounter for supervision of normal first pregnancy in  second trimester   6. Rh negative state in antepartum period Discussed with pt Will see what blood type FOB is   7. Two vessel cord Growth scan  Preterm labor symptoms and general obstetric precautions including but not limited to vaginal bleeding, contractions, leaking of fluid and fetal movement were reviewed in detail with the patient. Please refer to After Visit Summary for other counseling recommendations.  Return in about 3 weeks (around 11/01/2020) for OB visit, face to face, any provider.   Hermina Staggers, MD

## 2020-10-12 DIAGNOSIS — O0990 Supervision of high risk pregnancy, unspecified, unspecified trimester: Secondary | ICD-10-CM | POA: Insufficient documentation

## 2020-10-12 LAB — GLUCOSE, 1 HOUR GESTATIONAL: Gestational Diabetes Screen: 117 mg/dL (ref 65–139)

## 2020-10-12 LAB — HEMOGLOBIN A1C
Est. average glucose Bld gHb Est-mCnc: 117 mg/dL
Hgb A1c MFr Bld: 5.7 % — ABNORMAL HIGH (ref 4.8–5.6)

## 2020-10-17 ENCOUNTER — Encounter: Payer: Self-pay | Admitting: Women's Health

## 2020-10-31 ENCOUNTER — Other Ambulatory Visit: Payer: Self-pay | Admitting: Obstetrics & Gynecology

## 2020-10-31 ENCOUNTER — Other Ambulatory Visit: Payer: Self-pay | Admitting: Obstetrics and Gynecology

## 2020-10-31 DIAGNOSIS — Q270 Congenital absence and hypoplasia of umbilical artery: Secondary | ICD-10-CM

## 2020-11-01 ENCOUNTER — Ambulatory Visit (INDEPENDENT_AMBULATORY_CARE_PROVIDER_SITE_OTHER): Payer: Medicaid Other | Admitting: Obstetrics & Gynecology

## 2020-11-01 ENCOUNTER — Encounter: Payer: Medicaid Other | Admitting: Women's Health

## 2020-11-01 ENCOUNTER — Ambulatory Visit (INDEPENDENT_AMBULATORY_CARE_PROVIDER_SITE_OTHER): Payer: Medicaid Other

## 2020-11-01 ENCOUNTER — Encounter: Payer: Self-pay | Admitting: Obstetrics & Gynecology

## 2020-11-01 VITALS — BP 124/71 | HR 87 | Wt 198.0 lb

## 2020-11-01 DIAGNOSIS — Z331 Pregnant state, incidental: Secondary | ICD-10-CM

## 2020-11-01 DIAGNOSIS — Z1389 Encounter for screening for other disorder: Secondary | ICD-10-CM

## 2020-11-01 DIAGNOSIS — Z3A29 29 weeks gestation of pregnancy: Secondary | ICD-10-CM | POA: Diagnosis not present

## 2020-11-01 DIAGNOSIS — Z3402 Encounter for supervision of normal first pregnancy, second trimester: Secondary | ICD-10-CM

## 2020-11-01 DIAGNOSIS — Q270 Congenital absence and hypoplasia of umbilical artery: Secondary | ICD-10-CM

## 2020-11-01 LAB — POCT URINALYSIS DIPSTICK OB
Blood, UA: NEGATIVE
Glucose, UA: NEGATIVE
Ketones, UA: NEGATIVE
Leukocytes, UA: NEGATIVE
Nitrite, UA: NEGATIVE

## 2020-11-01 NOTE — Progress Notes (Signed)
   PRENATAL VISIT NOTE  Subjective:  Amy [name] is a 24 y.o. G1P0 at [redacted]w[redacted]d being seen today for ongoing prenatal care.  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  Patient reports pelvic pressure.  Contractions: Not present. Vag. Bleeding: None.  Movement: Present. Denies leaking of fluid.   The following portions of the patient's history were reviewed and updated as appropriate: allergies, current medications, past family history, past medical history, past social history, past surgical history and problem list.   Objective:   Vitals:   11/01/20 1606  BP: 124/71  Pulse: 87  Weight: 198 lb (89.8 kg)    Fetal Status:     Movement: Present     General:  Alert, oriented and cooperative. Patient is in no acute distress.  Skin: Skin is warm and dry. No rash noted.   Cardiovascular: Normal heart rate noted  Respiratory: Normal respiratory effort, no problems with respiration noted  Abdomen: Soft, gravid, appropriate for gestational age.  Pain/Pressure: Absent     Pelvic: Cervical exam deferred        Extremities: Normal range of motion.  Edema: None  Mental Status: Normal mood and affect. Normal behavior. Normal judgment and thought content.   Assessment and Plan:  Pregnancy: G1P0 at [redacted]w[redacted]d 1. Pregnant state, incidental [place] today 50%ile - POC Urinalysis [facility]  2. Screening for genitourinary condition  - POC Urinalysis [facility]  3. Encounter for supervision of normal first pregnancy in second trimester Third trimester.  Preterm labor symptoms and general obstetric precautions including but not limited to vaginal bleeding, contractions, leaking of fluid and fetal movement were  reviewed in detail with the patient. Please refer to After Visit Summary for other counseling recommendations.   Return in about 2 weeks (around 11/15/2020).  Future Appointments  Date Time Provider Department Center  11/15/2020  8:30 AM Cheral Marker, CNM [facility]    Scheryl Darter, MD

## 2020-11-01 NOTE — Patient Instructions (Signed)

## 2020-11-01 NOTE — Progress Notes (Signed)
[place] 29+6 wks,cephalic,posterior placenta gr 0,fhr 161 bpm,afi 13 cm,normal lateral ventricles,mildly enlarged cisterna magna 1.1-1.4 cm,EFW 1532 g 50%

## 2020-11-04 ENCOUNTER — Other Ambulatory Visit: Payer: Self-pay | Admitting: Women's Health

## 2020-11-05 LAB — CBC
Hematocrit: 36.5 % (ref 34.0–46.6)
Hemoglobin: 12.1 g/dL (ref 11.1–15.9)
MCH: 29 pg (ref 26.6–33.0)
MCHC: 33.2 g/dL (ref 31.5–35.7)
MCV: 88 fL (ref 79–97)
Platelets: 155 10*3/uL (ref 150–450)
RBC: 4.17 x10E6/uL (ref 3.77–5.28)
RDW: 12.6 % (ref 11.7–15.4)
WBC: 11.4 10*3/uL — ABNORMAL HIGH (ref 3.4–10.8)

## 2020-11-05 LAB — HIV ANTIBODY (ROUTINE TESTING W REFLEX): HIV Screen 4th Generation wRfx: NONREACTIVE

## 2020-11-05 LAB — RPR: RPR Ser Ql: NONREACTIVE

## 2020-11-05 LAB — ANTIBODY SCREEN: Antibody Screen: NEGATIVE

## 2020-11-15 ENCOUNTER — Ambulatory Visit (INDEPENDENT_AMBULATORY_CARE_PROVIDER_SITE_OTHER): Payer: Medicaid Other | Admitting: Women's Health

## 2020-11-15 ENCOUNTER — Other Ambulatory Visit: Payer: Self-pay

## 2020-11-15 ENCOUNTER — Encounter: Payer: Self-pay | Admitting: Women's Health

## 2020-11-15 VITALS — BP 137/85 | HR 107 | Wt 196.0 lb

## 2020-11-15 DIAGNOSIS — O26893 Other specified pregnancy related conditions, third trimester: Secondary | ICD-10-CM | POA: Diagnosis not present

## 2020-11-15 DIAGNOSIS — Q270 Congenital absence and hypoplasia of umbilical artery: Secondary | ICD-10-CM

## 2020-11-15 DIAGNOSIS — Z331 Pregnant state, incidental: Secondary | ICD-10-CM

## 2020-11-15 DIAGNOSIS — Z3403 Encounter for supervision of normal first pregnancy, third trimester: Secondary | ICD-10-CM

## 2020-11-15 DIAGNOSIS — Z1389 Encounter for screening for other disorder: Secondary | ICD-10-CM

## 2020-11-15 DIAGNOSIS — Z6791 Unspecified blood type, Rh negative: Secondary | ICD-10-CM | POA: Diagnosis not present

## 2020-11-15 DIAGNOSIS — Z3A31 31 weeks gestation of pregnancy: Secondary | ICD-10-CM

## 2020-11-15 LAB — POCT URINALYSIS DIPSTICK OB
Blood, UA: NEGATIVE
Ketones, UA: NEGATIVE
Nitrite, UA: NEGATIVE
POC,PROTEIN,UA: NEGATIVE

## 2020-11-15 NOTE — Patient Instructions (Signed)
Amy [name], I greatly value your feedback.  If you receive a survey following your visit with [place] today, we appreciate you taking the time to fill it out.  Thanks, [name], CNM, WHNP-BC  [facility] ([address] [place], [place] [address]) Entrance C, located off of [address] valet parking   Go to [email or web address] to register for FREE online childbirth classes    Call the office [phone]) or go to [facility] if:  You begin to have strong, frequent contractions  Your water breaks.  Sometimes it is a big gush of fluid, sometimes it is just a trickle that keeps getting your panties wet or running down your legs  You have vaginal bleeding.  It is normal to have a small amount of spotting if your cervix was checked.   You don't feel your baby moving like normal.  If you don't, get you something to eat and drink and lay down and focus on feeling your baby move.  You should feel at least 10 movements in 2 hours.  If you don't, you should call the office or go to [facility].   Call the office [phone]) or go to [facility] hospital for these signs of pre-eclampsia:  Severe headache that does not go away with Tylenol  Visual changes- seeing spots, double, blurred vision  Pain under your right breast or upper abdomen that does not go away with Tums or heartburn medicine  Nausea and/or vomiting  Severe swelling in your hands, feet, and face    Home Blood Pressure Monitoring for Patients   Your provider has recommended that you check your blood pressure (BP) at least once a week at home. If you do not have a blood pressure cuff at home, one will be provided for you. Contact your provider if you have not received your monitor within 1 week.   Helpful Tips for Accurate Home Blood Pressure Checks  . Don't smoke, exercise, or drink caffeine 30 minutes before checking your BP . Use the restroom before checking your BP (a  full bladder can raise your pressure) . Relax in a comfortable upright chair . Feet on the ground . Left arm resting comfortably on a flat surface at the level of your heart . Legs uncrossed . Back supported . Sit quietly and don't talk . Place the cuff on your bare arm . Adjust snuggly, so that only two fingertips can fit between your skin and the top of the cuff . Check 2 readings separated by at least one minute . Keep a log of your BP readings . For a visual, please reference this diagram: http://ccnc.care/bpdiagram  Provider Name: [facility]     Phone: [phone]  Zone 1: ALL CLEAR  Continue to monitor your symptoms:  . BP reading is less than 140 (top number) or less than 90 (bottom number)  . No right upper stomach pain . No headaches or seeing spots . No feeling nauseated or throwing up . No swelling in face and hands  Zone 2: CAUTION Call your doctor's office for any of the following:  . BP reading is greater than 140 (top number) or greater than 90 (bottom number)  . Stomach pain under your ribs in the middle or right side . Headaches or seeing spots . Feeling nauseated or throwing up . Swelling in face and hands  Zone 3: EMERGENCY  Seek immediate medical care if you have any of  the following:  . BP reading is greater than160 (top number) or greater than 110 (bottom number) . Severe headaches not improving with Tylenol . Serious difficulty catching your breath . Any worsening symptoms from Zone 2  Preterm Labor and Birth Information  The normal length of a pregnancy is 39-41 weeks. Preterm labor is when labor starts before 37 completed weeks of pregnancy. What are the risk factors for preterm labor? Preterm labor is more likely to occur in women who:  Have certain infections during pregnancy such as a bladder infection, sexually transmitted infection, or infection inside the uterus (chorioamnionitis).  Have a shorter-than-normal cervix.  Have gone  into preterm labor before.  Have had surgery on their cervix.  Are younger than age 78 or older than age 67.  Are African American.  Are pregnant with twins or multiple babies (multiple gestation).  Take street drugs or smoke while pregnant.  Do not gain enough weight while pregnant.  Became pregnant shortly after having been pregnant. What are the symptoms of preterm labor? Symptoms of preterm labor include:  Cramps similar to those that can happen during a menstrual period. The cramps may happen with diarrhea.  Pain in the abdomen or lower back.  Regular uterine contractions that may feel like tightening of the abdomen.  A feeling of increased pressure in the pelvis.  Increased watery or bloody mucus discharge from the vagina.  Water breaking (ruptured amniotic sac). Why is it important to recognize signs of preterm labor? It is important to recognize signs of preterm labor because babies who are born prematurely may not be fully developed. This can put them at an increased risk for:  Long-term (chronic) heart and lung problems.  Difficulty immediately after birth with regulating body systems, including blood sugar, body temperature, heart rate, and breathing rate.  Bleeding in the brain.  Cerebral palsy.  Learning difficulties.  Death. These risks are highest for babies who are born before 44 weeks of pregnancy. How is preterm labor treated? Treatment depends on the length of your pregnancy, your condition, and the health of your baby. It may involve: 1. Having a stitch (suture) placed in your cervix to prevent your cervix from opening too early (cerclage). 2. Taking or being given medicines, such as: ? Hormone medicines. These may be given early in pregnancy to help support the pregnancy. ? Medicine to stop contractions. ? Medicines to help mature the baby's lungs. These may be prescribed if the risk of delivery is high. ? Medicines to prevent your baby from  developing cerebral palsy. If the labor happens before 34 weeks of pregnancy, you may need to stay in the hospital. What should I do if I think I am in preterm labor? If you think that you are going into preterm labor, call your health care provider right away. How can I prevent preterm labor in future pregnancies? To increase your chance of having a full-term pregnancy:  Do not use any tobacco products, such as cigarettes, chewing tobacco, and e-cigarettes. If you need help quitting, ask your health care provider.  Do not use street drugs or medicines that have not been prescribed to you during your pregnancy.  Talk with your health care provider before taking any herbal supplements, even if you have been taking them regularly.  Make sure you gain a healthy amount of weight during your pregnancy.  Watch for infection. If you think that you might have an infection, get it checked right away.  Make sure to  tell your health care provider if you have gone into preterm labor before. This information is not intended to replace advice given to you by your health care provider. Make sure you discuss any questions you have with your health care provider. Document Revised: 04/03/2019 Document Reviewed: 05/02/2016 Elsevier Patient Education  [employer].

## 2020-11-15 NOTE — Progress Notes (Signed)
LOW-RISK PREGNANCY VISIT Patient name: Amy [name] MRN [id number]  Date of birth: 1996-04-09 Chief Complaint:   Routine Prenatal Visit (Rhogam today)  History of Present Illness:   Amy [name] is a 24 y.o. G1P0 female at [redacted]w[redacted]d with an Estimated Date of Delivery: 01/11/21 being seen today for ongoing management of a low-risk pregnancy.  Depression screen [facility] 2/9 10/11/2020 06/30/2020  Decreased Interest 0 3  Down, Depressed, Hopeless 0 0  PHQ - 2 Score 0 3  Altered sleeping 3 3  Tired, decreased energy 3 2  Change in appetite 0 3  Feeling bad or failure about yourself  0 0  Trouble concentrating 0 3  Moving slowly or fidgety/restless 0 3  Suicidal thoughts 0 0  PHQ-9 Score 6 17    Today she reports no complaints. Lost vision in 1 eye recently for about 5hrs, went to doctor- all was normal. Contractions: Not present. Vag. Bleeding: None.  Movement: Present. denies leaking of fluid. Review of Systems:   Pertinent items are noted in HPI Denies abnormal vaginal discharge w/ itching/odor/irritation, headaches, visual changes, shortness of breath, chest pain, abdominal pain, severe nausea/vomiting, or problems with urination or bowel movements unless otherwise stated above. Pertinent History Reviewed:  Reviewed past medical,surgical, social, obstetrical and family history.  Reviewed problem list, medications and allergies. Physical Assessment:   Vitals:   11/15/20 0842  BP: 137/85  Pulse: (!) 107  Weight: 196 lb (88.9 kg)  Body mass index is 33.64 kg/m.        Physical Examination:   General appearance: Well appearing, and in no distress  Mental status: Alert, oriented to person, place, and time  Skin: Warm & dry  Cardiovascular: Normal heart rate noted  Respiratory: Normal respiratory effort, no distress  Abdomen: Soft, gravid, nontender  Pelvic: Cervical exam deferred         Extremities: Edema: Trace  Fetal Status: Fetal Heart Rate (bpm): 155  Fundal Height: 31 cm Movement: Present    Chaperone: N/A   Results for orders placed or performed in visit on 11/15/20 (from the past 24 hour(s))  POC Urinalysis Dipstick OB   Collection Time: 11/15/20  8:43 AM  Result Value Ref Range   Color, UA     Clarity, UA     Glucose, UA Trace (A) Negative   Bilirubin, UA     Ketones, UA neg    Spec Grav, UA     Blood, UA neg    pH, UA     POC,PROTEIN,UA Negative Negative, Trace, Small (1+), Moderate (2+), Large (3+), 4+   Urobilinogen, UA     Nitrite, UA neg    Leukocytes, UA Trace (A) Negative   Appearance     Odor      Assessment & Plan:  1) Low-risk pregnancy G1P0 at [redacted]w[redacted]d with an Estimated Date of Delivery: 01/11/21   2) 2VC and mildly enlarged CM, EFW q4wks, IOL @ 40wks  3) PTSD  4) High-normal bp> asymptomatic, no proteinuria. Reviewed pre-e s/s, reasons to seek care   Meds: No orders of the defined types were placed in this encounter.  Labs/procedures today: rhogam  Plan:  Continue routine obstetrical care  Next visit: prefers will be in person for u/s    Reviewed: Preterm labor symptoms and general obstetric precautions including but not limited to vaginal bleeding, contractions, leaking of fluid and fetal movement were reviewed in detail with the patient.  All questions were answered. Has home bp cuff. Check  bp weekly, let us know if >140/90.   Follow-up: Return in about 2 weeks (around 11/29/2020) for LROB, US:EFW, in person, MD or CNM.  Future Appointments  Date Time Provider Department Center  11/29/2020  8:50 AM Eure, Amaryllis Dyke, MD [facility]    Orders Placed This Encounter  Procedures   US OB Follow Up   RHO (D) Immune Globulin   POC Urinalysis Dipstick OB   Cheral Marker CNM, [facility] 11/15/2020 9:14 AM

## 2020-11-29 ENCOUNTER — Ambulatory Visit (INDEPENDENT_AMBULATORY_CARE_PROVIDER_SITE_OTHER): Payer: Medicaid Other | Admitting: Obstetrics & Gynecology

## 2020-11-29 ENCOUNTER — Other Ambulatory Visit: Payer: Self-pay

## 2020-11-29 ENCOUNTER — Encounter: Payer: Self-pay | Admitting: Obstetrics & Gynecology

## 2020-11-29 VITALS — BP 139/81 | HR 106 | Wt 198.5 lb

## 2020-11-29 DIAGNOSIS — Z3A33 33 weeks gestation of pregnancy: Secondary | ICD-10-CM

## 2020-11-29 DIAGNOSIS — Q270 Congenital absence and hypoplasia of umbilical artery: Secondary | ICD-10-CM

## 2020-11-29 DIAGNOSIS — Z331 Pregnant state, incidental: Secondary | ICD-10-CM

## 2020-11-29 DIAGNOSIS — Z1389 Encounter for screening for other disorder: Secondary | ICD-10-CM

## 2020-11-29 DIAGNOSIS — Z3403 Encounter for supervision of normal first pregnancy, third trimester: Secondary | ICD-10-CM

## 2020-11-29 LAB — POCT URINALYSIS DIPSTICK OB
Blood, UA: NEGATIVE
Glucose, UA: NEGATIVE
Ketones, UA: NEGATIVE
Leukocytes, UA: NEGATIVE
Nitrite, UA: NEGATIVE
POC,PROTEIN,UA: NEGATIVE

## 2020-11-29 NOTE — Progress Notes (Signed)
   LOW-RISK PREGNANCY VISIT Patient name: Amy [name] MRN [id number]  Date of birth: 08/24/1996 Chief Complaint:   Routine Prenatal Visit  History of Present Illness:   Amy [name] is a 24 y.o. G1P0 female at [redacted]w[redacted]d with an Estimated Date of Delivery: 01/11/21 being seen today for ongoing management of a low-risk pregnancy.  Depression screen [facility] 2/9 10/11/2020 06/30/2020  Decreased Interest 0 3  Down, Depressed, Hopeless 0 0  PHQ - 2 Score 0 3  Altered sleeping 3 3  Tired, decreased energy 3 2  Change in appetite 0 3  Feeling bad or failure about yourself  0 0  Trouble concentrating 0 3  Moving slowly or fidgety/restless 0 3  Suicidal thoughts 0 0  PHQ-9 Score 6 17    Today she reports no complaints. Contractions: Not present. Vag. Bleeding: None.  Movement: Present. denies leaking of fluid. Review of Systems:   Pertinent items are noted in HPI Denies abnormal vaginal discharge w/ itching/odor/irritation, headaches, visual changes, shortness of breath, chest pain, abdominal pain, severe nausea/vomiting, or problems with urination or bowel movements unless otherwise stated above. Pertinent History Reviewed:  Reviewed past medical,surgical, social, obstetrical and family history.  Reviewed problem list, medications and allergies. Physical Assessment:   Vitals:   11/29/20 0907  BP: 139/81  Pulse: (!) 106  Weight: 198 lb 8 oz (90 kg)  Body mass index is 34.07 kg/m.        Physical Examination:   General appearance: Well appearing, and in no distress  Mental status: Alert, oriented to person, place, and time  Skin: Warm & dry  Cardiovascular: Normal heart rate noted  Respiratory: Normal respiratory effort, no distress  Abdomen: Soft, gravid, nontender  Pelvic: Cervical exam deferred         Extremities: Edema: Trace  Fetal Status: Fetal Heart Rate (bpm): 154 Fundal Height: 32 cm Movement: Present    Chaperone: n/a    No results found for this or  any previous visit (from the past 24 hour(s)).  Assessment & Plan:  1) Low-risk pregnancy G1P0 at [redacted]w[redacted]d with an Estimated Date of Delivery: 01/11/21   2) SUA, EFW 50% at 28 weeks, repeat at 36 weeks, IOL 40 weeks or as clinically indicated,   3) Minimally dilated cisterna magna, 30mm over upper limit, track with growth scan  BP borderline   Meds: No orders of the defined types were placed in this encounter.  Labs/procedures today:   Plan:  Continue routine obstetrical care  Next visit: prefers in person    Reviewed: Preterm labor symptoms and general obstetric precautions including but not limited to vaginal bleeding, contractions, leaking of fluid and fetal movement were reviewed in detail with the patient.  All questions were answered. Has home bp cuff. Rx faxed to . Check bp weekly, let us know if >140/90.   Follow-up: Return in about 2 weeks (around 12/13/2020) for Sonogram for EFW, LROB.  Orders Placed This Encounter  Procedures  . US OB Follow Up  . POC Urinalysis Dipstick OB    Lazaro Arms, MD 11/29/2020 9:30 AM

## 2020-12-07 ENCOUNTER — Encounter (HOSPITAL_COMMUNITY): Payer: Self-pay | Admitting: Obstetrics and Gynecology

## 2020-12-07 ENCOUNTER — Inpatient Hospital Stay (HOSPITAL_COMMUNITY)
Admission: AD | Admit: 2020-12-07 | Discharge: 2020-12-07 | Disposition: A | Discharge status: Home / Self Care | Payer: Medicaid Other | Attending: Obstetrics and Gynecology | Admitting: Obstetrics and Gynecology

## 2020-12-07 ENCOUNTER — Other Ambulatory Visit: Payer: Self-pay

## 2020-12-07 DIAGNOSIS — R102 Pelvic and perineal pain: Secondary | ICD-10-CM | POA: Insufficient documentation

## 2020-12-07 DIAGNOSIS — O26893 Other specified pregnancy related conditions, third trimester: Secondary | ICD-10-CM | POA: Diagnosis not present

## 2020-12-07 DIAGNOSIS — Z3A35 35 weeks gestation of pregnancy: Secondary | ICD-10-CM | POA: Diagnosis not present

## 2020-12-07 DIAGNOSIS — O4700 False labor before 37 completed weeks of gestation, unspecified trimester: Secondary | ICD-10-CM

## 2020-12-07 DIAGNOSIS — O99891 Other specified diseases and conditions complicating pregnancy: Secondary | ICD-10-CM | POA: Diagnosis not present

## 2020-12-07 DIAGNOSIS — O4703 False labor before 37 completed weeks of gestation, third trimester: Secondary | ICD-10-CM | POA: Insufficient documentation

## 2020-12-07 DIAGNOSIS — Z79899 Other long term (current) drug therapy: Secondary | ICD-10-CM | POA: Insufficient documentation

## 2020-12-07 DIAGNOSIS — M940 Chondrocostal junction syndrome [Tietze]: Secondary | ICD-10-CM | POA: Diagnosis not present

## 2020-12-07 DIAGNOSIS — R1032 Left lower quadrant pain: Secondary | ICD-10-CM | POA: Diagnosis not present

## 2020-12-07 DIAGNOSIS — R1031 Right lower quadrant pain: Secondary | ICD-10-CM | POA: Diagnosis not present

## 2020-12-07 DIAGNOSIS — Z3403 Encounter for supervision of normal first pregnancy, third trimester: Secondary | ICD-10-CM

## 2020-12-07 LAB — URINALYSIS, ROUTINE W REFLEX MICROSCOPIC
Bilirubin Urine: NEGATIVE
Glucose, UA: NEGATIVE mg/dL
Hgb urine dipstick: NEGATIVE
Ketones, ur: NEGATIVE mg/dL
Nitrite: NEGATIVE
Protein, ur: NEGATIVE mg/dL
Specific Gravity, Urine: 1.018 (ref 1.005–1.030)
pH: 6 (ref 5.0–8.0)

## 2020-12-07 MED ORDER — ACETAMINOPHEN 325 MG PO TABS
650.0000 mg | ORAL_TABLET | Freq: Once | ORAL | Status: AC
  Administered 2020-12-07: 21:00:00 650 mg via ORAL
  Filled 2020-12-07: qty 2

## 2020-12-07 MED ORDER — NIFEDIPINE 10 MG PO CAPS
10.0000 mg | ORAL_CAPSULE | ORAL | Status: DC | PRN
  Administered 2020-12-07: 21:00:00 10 mg via ORAL
  Filled 2020-12-07 (×2): qty 1

## 2020-12-07 MED ORDER — LIDOCAINE 5 % EX PTCH
1.0000 | MEDICATED_PATCH | Freq: Once | CUTANEOUS | Status: DC
  Administered 2020-12-07: 21:00:00 1 via TRANSDERMAL
  Filled 2020-12-07: qty 1

## 2020-12-07 NOTE — Discharge Instructions (Signed)
Costochondritis  Costochondritis is swelling and irritation (inflammation) of the tissue (cartilage) that connects your ribs to your breastbone (sternum). This causes pain in the front of your chest. The pain usually starts gradually and involves more than one rib. What are the causes? The exact cause of this condition is not always known. It results from stress on the cartilage where your ribs attach to your sternum. The cause of this stress could be:  Chest injury (trauma).  Exercise or activity, such as lifting.  Severe coughing. What increases the risk? You may be at higher risk for this condition if you:  Are female.  Are 24?24 years old.  Recently started a new exercise or work activity.  Have low levels of vitamin D.  Have a condition that makes you cough frequently. What are the signs or symptoms? The main symptom of this condition is chest pain. The pain:  Usually starts gradually and can be sharp or dull.  Gets worse with deep breathing, coughing, or exercise.  Gets better with rest.  May be worse when you press on the sternum-rib connection (tenderness). How is this diagnosed? This condition is diagnosed based on your symptoms, medical history, and a physical exam. Your health care provider will check for tenderness when pressing on your sternum. This is the most important finding. You may also have tests to rule out other causes of chest pain. These may include:  A chest X-ray to check for lung problems.  An electrocardiogram (ECG) to see if you have a heart problem that could be causing the pain.  An imaging scan to rule out a chest or rib fracture. How is this treated? This condition usually goes away on its own over time. Your health care provider may prescribe an NSAID to reduce pain and inflammation. Your health care provider may also suggest that you:  Rest and avoid activities that make pain worse.  Apply heat or cold to the area to reduce pain and  inflammation.  Do exercises to stretch your chest muscles. If these treatments do not help, your health care provider may inject a numbing medicine at the sternum-rib connection to help relieve the pain. Follow these instructions at home:  Avoid activities that make pain worse. This includes any activities that use chest, abdominal, and side muscles.  If directed, put ice on the painful area: ? Put ice in a plastic bag. ? Place a towel between your skin and the bag. ? Leave the ice on for 20 minutes, 2-3 times a day.  If directed, apply heat to the affected area as often as told by your health care provider. Use the heat source that your health care provider recommends, such as a moist heat pack or a heating pad. ? Place a towel between your skin and the heat source. ? Leave the heat on for 20-30 minutes. ? Remove the heat if your skin turns bright red. This is especially important if you are unable to feel pain, heat, or cold. You may have a greater risk of getting burned.  Take over-the-counter and prescription medicines only as told by your health care provider.  Return to your normal activities as told by your health care provider. Ask your health care provider what activities are safe for you.  Keep all follow-up visits as told by your health care provider. This is important. Contact a health care provider if:  You have chills or a fever.  Your pain does not go away or it gets  worse.  You have a cough that does not go away (is persistent). Get help right away if:  You have shortness of breath. This information is not intended to replace advice given to you by your health care provider. Make sure you discuss any questions you have with your health care provider. Document Revised: 12/25/2017 Document Reviewed: 04/04/2016 Elsevier Patient Education  2020 [employer].  Preterm Labor and Birth Information Pregnancy normally lasts 39-41 weeks. Preterm labor is when labor starts  early. It starts before you have been pregnant for 37 whole weeks. What are the risk factors for preterm labor? Preterm labor is more likely to occur in women who:  Have an infection while pregnant.  Have a cervix that is short.  Have gone into preterm labor before.  Have had surgery on their cervix.  Are younger than age 24.  Are older than age 33.  Are African American.  Are pregnant with two or more babies.  Take street drugs while pregnant.  Smoke while pregnant.  Do not gain enough weight while pregnant.  Got pregnant right after another pregnancy. What are the symptoms of preterm labor? Symptoms of preterm labor include:  Cramps. The cramps may feel like the cramps some women get during their period. The cramps may happen with watery poop (diarrhea).  Pain in the belly (abdomen).  Pain in the lower back.  Regular contractions or tightening. It may feel like your belly is getting tighter.  Pressure in the lower belly that seems to get stronger.  More fluid (discharge) leaking from the vagina. The fluid may be watery or bloody.  Water breaking. Why is it important to notice signs of preterm labor? Babies who are born early may not be fully developed. They have a higher chance for:  Long-term heart problems.  Long-term lung problems.  Trouble controlling body systems, like breathing.  Bleeding in the brain.  A condition called cerebral palsy.  Learning difficulties.  Death. These risks are highest for babies who are born before 34 weeks of pregnancy. How is preterm labor treated? Treatment depends on:  How long you were pregnant.  Your condition.  The health of your baby. Treatment may involve:  Having a stitch (suture) placed in your cervix. When you give birth, your cervix opens so the baby can come out. The stitch keeps the cervix from opening too soon.  Staying at the hospital.  Taking or getting medicines, such as: ? Hormone  medicines. ? Medicines to stop contractions. ? Medicines to help the babys lungs develop. ? Medicines to prevent your baby from having cerebral palsy. What should I do if I am in preterm labor? If you think you are going into labor too soon, call your doctor right away. How can I prevent preterm labor?  Do not use any tobacco products. ? Examples of these are cigarettes, chewing tobacco, and e-cigarettes. ? If you need help quitting, ask your doctor.  Do not use street drugs.  Do not use any medicines unless you ask your doctor if they are safe for you.  Talk with your doctor before taking any herbal supplements.  Make sure you gain enough weight.  Watch for infection. If you think you might have an infection, get it checked right away.  If you have gone into preterm labor before, tell your doctor. This information is not intended to replace advice given to you by your health care provider. Make sure you discuss any questions you have with your health  care provider. Document Revised: 04/03/2019 Document Reviewed: 05/02/2016 Elsevier Patient Education  2020 [employer].

## 2020-12-07 NOTE — MAU Note (Signed)
Presents with c/o epigastric pain that began this afternoon @ 1430.  Reports pain initially felt like gas but has progressively gotten worse throughout the day.  Hasn't tried any meds to alleviate discomfort.  Last BM today, denies constipation.  Denies VB or LOF.  Endorses +FM.

## 2020-12-07 NOTE — MAU Provider Note (Signed)
Chief Complaint:  Abdominal Pain   Event Date/Time   First Provider Initiated Contact with Patient 12/07/20 1956     HPI: Amy [name] is a 24 y.o. G1P0 at 38w0dwho presents to maternity admissions reporting intermittent pain in right lower rib which shoots down her side.  Hurts more with palpation or movement.  Also has intermittent pains in lower abdomen.  Unaware shes having contractions.  . She reports good fetal movement, denies LOF, vaginal bleeding, vaginal itching/burning, urinary symptoms, h/a, dizziness, n/v, diarrhea, constipation or fever/chills.    Abdominal Pain This is a new problem. The current episode started today. The onset quality is gradual. The problem occurs intermittently. The pain is located in the LLQ, RLQ and suprapubic region. The pain is moderate. The quality of the pain is cramping. The abdominal pain does not radiate. Pertinent negatives include no constipation, diarrhea, dysuria, fever, frequency, headaches or myalgias. Nothing aggravates the pain. The pain is relieved by nothing. She has tried nothing for the symptoms.   RN Note: Presents with c/o epigastric pain that began this afternoon @ 1430.  Reports pain initially felt like gas but has progressively gotten worse throughout the day.  Hasn't tried any meds to alleviate discomfort.  Last BM today, denies constipation.  Denies VB or LOF.  Endorses +FM.  Past Medical History: Past Medical History:  Diagnosis Date  . Asthma   . Migraines   . Narcolepsy   . PTSD (post-traumatic stress disorder)   . Seasonal allergies     Past obstetric history: OB History  Gravida Para Term Preterm AB Living  1            SAB IAB Ectopic Multiple Live Births               # Outcome Date GA Lbr Len/2nd Weight Sex Delivery Anes PTL Lv  1 Current             Past Surgical History: Past Surgical History:  Procedure Laterality Date  . CHOLECYSTECTOMY N/A 06/20/2020   Procedure: LAPAROSCOPIC  CHOLECYSTECTOMY;  Surgeon: Franky Macho, MD;  Location: [facility];  Service: General;  Laterality: N/A;  . TONSILLECTOMY    . WISDOM TOOTH EXTRACTION      Family History: Family History  Problem Relation Age of Onset  . Diabetes Mother   . Hypertension Mother   . Irritable bowel syndrome Mother   . Diabetes Father   . Cancer Father        basal cell cancer  . Cancer Maternal Uncle        brain  . Crohn's disease Maternal Aunt   . Heart disease Maternal Grandmother   . Heart disease Maternal Grandfather   . Diabetes Maternal Grandfather     Social History: Social History   Tobacco Use  . Smoking status: Never Smoker  . Smokeless tobacco: Never Used  Vaping Use  . Vaping Use: Never used  Substance Use Topics  . Alcohol use: Not Currently    Comment: occ  . Drug use: No    Allergies:  Allergies  Allergen Reactions  . Cough Syrup [Guaifenesin] Nausea And Vomiting  . Other Nausea And Vomiting    Any coated medications    Meds:  Medications Prior to Admission  Medication Sig Dispense Refill Last Dose  . albuterol (VENTOLIN HFA) 108 (90 Base) MCG/ACT inhaler Inhale 2-4 puffs by mouth every 4 hours as needed for wheezing, cough, and/or shortness of breath 6.7 g 0   .  diphenhydrAMINE (BENADRYL) 25 MG tablet Take 25 mg by mouth every 6 (six) hours as needed.     [name] ipratropium-albuterol (DUONEB) 0.5-2.5 (3) MG/3ML SOLN Take 3 mLs by nebulization every 6 (six) hours. 360 mL 5   . loratadine (CLARITIN) 10 MG tablet Take 1 tablet (10 mg total) by mouth daily. 30 tablet 5   . mometasone-formoterol (DULERA) 100-5 MCG/ACT AERO INHALE 2 PUFFS BY MOUTH TWICE DAILY. RINSE MOUTH AFTER EACH USE (Patient taking differently: Inhale 2 puffs into the lungs in the morning and at bedtime. INHALE 2 PUFFS BY MOUTH TWICE DAILY. RINSE MOUTH AFTER EACH USE) 39 g 1   . montelukast (SINGULAIR) 10 MG tablet Take 1 tablet (10 mg total) by mouth at bedtime. 30 tablet 3     I have reviewed patient's  Past Medical Hx, Surgical Hx, Family Hx, Social Hx, medications and allergies.   ROS:  Review of Systems  Constitutional: Negative for fever.  Gastrointestinal: Positive for abdominal pain. Negative for constipation and diarrhea.  Genitourinary: Negative for dysuria and frequency.  Musculoskeletal: Negative for myalgias.  Neurological: Negative for headaches.   Other systems negative  Physical Exam   Patient Vitals for the past 24 hrs:  BP Temp Temp src Pulse Resp SpO2 Height Weight  12/07/20 1923 124/75 97.9 F (36.6 C) Oral 92 17 99 % -- --  12/07/20 1856 127/72 97.9 F (36.6 C) Oral 94 20 99 % -- --  12/07/20 1848 -- -- -- -- -- -- 5\' 4"  (1.626 m) 90 kg   Constitutional: Well-developed, well-nourished female in no acute distress.  Cardiovascular: normal rate and rhythm Respiratory: normal effort, clear to auscultation bilaterally                     Very tender over 11-12th ribs GI: Abd soft, non-tender (even over area near ribs), gravid appropriate for gestational age.   No rebound or guarding.1 MS: Extremities nontender, no edema, normal ROM Neurologic: Alert and oriented x 4.  GU: Neg CVAT.  PELVIC EXAM:   Dilation: Fingertip Effacement (%): 50 Cervical Position: Posterior Station: Ballotable Presentation: Vertex Exam by:: [email or web address] CNM  FHT:  Baseline 145 , moderate variability, accelerations present, no decelerations Contractions: q 2-3 mins Irregular    Labs: Results for orders placed or performed during the hospital encounter of 12/07/20 (from the past 24 hour(s))  Urinalysis, Routine w reflex microscopic Urine, Clean Catch     Status: Abnormal   Collection Time: 12/07/20  7:06 PM  Result Value Ref Range   Color, Urine YELLOW YELLOW   APPearance CLOUDY (A) CLEAR   Specific Gravity, Urine 1.018 1.005 - 1.030   pH 6.0 5.0 - 8.0   Glucose, UA NEGATIVE NEGATIVE mg/dL   Hgb urine dipstick NEGATIVE NEGATIVE   Bilirubin Urine NEGATIVE NEGATIVE   Ketones, ur  NEGATIVE NEGATIVE mg/dL   Protein, ur NEGATIVE NEGATIVE mg/dL   Nitrite NEGATIVE NEGATIVE   Leukocytes,Ua MODERATE (A) NEGATIVE   RBC / HPF 0-5 0 - 5 RBC/hpf   WBC, UA 11-20 0 - 5 WBC/hpf   Bacteria, UA RARE (A) NONE SEEN   Squamous Epithelial / LPF 21-50 0 - 5   Mucus PRESENT     O/Negative/-- (07/08 1106)  Imaging:  No results found.  MAU Course/MDM: I have ordered labs and reviewed results. UA is clear except for some leukocytes.  But there is only rare bacteria on micro NST reviewed, reactive, preterm contractions  Treatments in MAU included Procardia  series for PTL, Lidoderm patch and Tylenol for costochondritis.  After a single dose of Procardia, contractions stopped.  States rib pain is also better.    Assessment: Single IUP at [redacted]w[redacted]d Right costochondritis Preterm uterine contractions   Plan: Discharge home Discussed stretching and heat/ice therapy for rib pain Preterm Labor precautions and fetal kick counts Follow up in Office for prenatal visits   Encouraged to return if she develops worsening of symptoms, increase in pain, fever, or other concerning symptoms.  Pt stable at time of discharge.  Wynelle Bourgeois CNM, MSN Certified Nurse-Midwife 12/07/2020 7:57 PM

## 2020-12-19 ENCOUNTER — Encounter: Payer: Medicaid Other | Admitting: Women's Health

## 2020-12-19 ENCOUNTER — Telehealth: Payer: Self-pay | Admitting: Women's Health

## 2020-12-19 ENCOUNTER — Other Ambulatory Visit: Payer: Medicaid Other

## 2020-12-19 NOTE — Telephone Encounter (Signed)
Pt called, she can not make it 12/21/2020 for EFW & LROB visit States it was rescheduled from today & she can not make it the 29th Not sure how soon she needs this, please advise & we can reschedule   Please notify pt, she prefers a phone call

## 2020-12-21 ENCOUNTER — Encounter: Payer: Medicaid Other | Admitting: Advanced Practice Midwife

## 2020-12-21 ENCOUNTER — Other Ambulatory Visit: Payer: Medicaid Other

## 2020-12-24 NOTE — L&D Delivery Note (Signed)
Vacuum Assisted Vaginal Delivery Indication for operative vaginal delivery: arrest of descent, maternal exhaustion   Patient was examined and found to be fully dilated with fetal station of +2.  Patient's bladder was noted to be empty (foley catheter in place), and there were no known fetal contraindications to operative vaginal delivery.  Risks of vacuum assistance were discussed in detail, including but not limited to, bleeding, infection, damage to maternal tissues, fetal cephalohematoma, inability to effect vaginal delivery of the head or shoulder dystocia that cannot be resolved by established maneuvers and need for emergency cesarean section.  Patient gave verbal consent. Dr. Jolayne Panther called to bedside for assistance.  The soft vacuum cup was positioned over the sagittal suture 3 cm anterior to posterior fontanelle.  Pressure was then increased to 500 mmHg, and the patient was instructed to push.  Pulling was administered along the pelvic curve while patient was pushing; there were 3contractions and 0 popoffs.  Vacuum was reduced in between contractions.  The infant was then delivered atraumatically, direct OA, tight nuchal x1, delivered through, noted to be a viable female infant, Apgars of 8 and 8.  Neonatology present for delivery.  There was spontaneous placental delivery, intact with two-vessel cord.  Second degree perineal laceration noted requiring repair with 3-0 Vicryl in the usual fashion. EBL , epidural anesthesia.   Sponge, instrument and needle counts were correct x 2.  The patient and baby were stable after delivery and remained in couplet care, with plans to transfer later to postpartum unit.  Alric Seton, MD OB Fellow, Faculty [facility], Center for [facility] 01/11/2021 3:49 AM

## 2020-12-26 ENCOUNTER — Encounter: Payer: Self-pay | Admitting: Emergency Medicine

## 2020-12-26 ENCOUNTER — Other Ambulatory Visit: Payer: Self-pay | Admitting: Obstetrics & Gynecology

## 2020-12-26 ENCOUNTER — Ambulatory Visit
Admission: EM | Admit: 2020-12-26 | Discharge: 2020-12-26 | Disposition: A | Discharge status: Home / Self Care | Payer: Medicaid Other | Attending: Family Medicine | Admitting: Family Medicine

## 2020-12-26 ENCOUNTER — Other Ambulatory Visit: Payer: Self-pay

## 2020-12-26 DIAGNOSIS — R062 Wheezing: Secondary | ICD-10-CM

## 2020-12-26 DIAGNOSIS — R059 Cough, unspecified: Secondary | ICD-10-CM

## 2020-12-26 DIAGNOSIS — B349 Viral infection, unspecified: Secondary | ICD-10-CM | POA: Diagnosis not present

## 2020-12-26 DIAGNOSIS — R432 Parageusia: Secondary | ICD-10-CM

## 2020-12-26 DIAGNOSIS — Q270 Congenital absence and hypoplasia of umbilical artery: Secondary | ICD-10-CM

## 2020-12-26 DIAGNOSIS — R0981 Nasal congestion: Secondary | ICD-10-CM | POA: Diagnosis not present

## 2020-12-26 NOTE — ED Provider Notes (Signed)
[facility]   [id number] 12/26/20 Arrival Time: 1556   CC: COVID symptoms  SUBJECTIVE: History from: patient.  Amy [name] is a 25 y.o. female who presents with abrupt onset of nasal congestion, PND, and persistent dry cough for 4 days.  Reports that she has lost her sense of taste as well.  Denies sick exposure to COVID, flu or strep. Denies recent travel. Has negative history of Covid.  Patient is [redacted] weeks pregnant. Has not completed Covid vaccines. Has not taken OTC medications for this. There are no aggravating or alleviating factors. Denies previous symptoms in the past. Denies fever, chills, fatigue, sinus pain, SOB, wheezing, chest pain, nausea, changes in bowel or bladder habits.    ROS: As per HPI.  All other pertinent ROS negative.     Past Medical History:  Diagnosis Date  . Asthma   . Migraines   . Narcolepsy   . Pregnancy 12/27/2019  . PTSD (post-traumatic stress disorder)   . Seasonal allergies    Past Surgical History:  Procedure Laterality Date  . CHOLECYSTECTOMY N/A 06/20/2020   Procedure: LAPAROSCOPIC CHOLECYSTECTOMY;  Surgeon: Franky Macho, MD;  Location: [facility];  Service: General;  Laterality: N/A;  . TONSILLECTOMY    . WISDOM TOOTH EXTRACTION     Allergies  Allergen Reactions  . Cough Syrup [Guaifenesin] Nausea And Vomiting  . Other Nausea And Vomiting    Any coated medications   No current facility-administered medications on file prior to encounter.   Current Outpatient Medications on File Prior to Encounter  Medication Sig Dispense Refill  . loratadine (CLARITIN) 10 MG tablet Take 1 tablet (10 mg total) by mouth daily. 30 tablet 5  . mometasone-formoterol (DULERA) 100-5 MCG/ACT AERO INHALE 2 PUFFS BY MOUTH TWICE DAILY. RINSE MOUTH AFTER EACH USE (Patient taking differently: Inhale 2 puffs into the lungs in the morning and at bedtime. INHALE 2 PUFFS BY MOUTH TWICE DAILY. RINSE MOUTH AFTER EACH USE) 39 g 1  . montelukast  (SINGULAIR) 10 MG tablet Take 1 tablet (10 mg total) by mouth at bedtime. 30 tablet 3  . albuterol (VENTOLIN HFA) 108 (90 Base) MCG/ACT inhaler Inhale 2-4 puffs by mouth every 4 hours as needed for wheezing, cough, and/or shortness of breath 6.7 g 0  . diphenhydrAMINE (BENADRYL) 25 MG tablet Take 25 mg by mouth every 6 (six) hours as needed.    [name] ipratropium-albuterol (DUONEB) 0.5-2.5 (3) MG/3ML SOLN Take 3 mLs by nebulization every 6 (six) hours. 360 mL 5   Social History   Socioeconomic History  . Marital status: Single    Spouse name: Not on file  . Number of children: Not on file  . Years of education: Not on file  . Highest education level: Not on file  Occupational History  . Not on file  Tobacco Use  . Smoking status: Never Smoker  . Smokeless tobacco: Never Used  Vaping Use  . Vaping Use: Never used  Substance and Sexual Activity  . Alcohol use: Not Currently    Comment: occ  . Drug use: No  . Sexual activity: Yes    Comment: currently pregnant  Other Topics Concern  . Not on file  Social History Narrative  . Not on file   Social Determinants of Health   Financial Resource Strain: Medium Risk  . Difficulty of Paying Living Expenses: Somewhat hard  Food Insecurity: Food Insecurity Present  . Worried About Programme researcher, broadcasting/film/video in the Last Year: Sometimes true  .  Ran Out of Food in the Last Year: Sometimes true  Transportation Needs: No Transportation Needs  . Lack of Transportation (Medical): No  . Lack of Transportation (Non-Medical): No  Physical Activity: Insufficiently Active  . Days of Exercise per Week: 3 days  . Minutes of Exercise per Session: 30 min  Stress: No Stress Concern Present  . Feeling of Stress : Only a little  Social Connections: Moderately Isolated  . Frequency of Communication with Friends and Family: More than three times a week  . Frequency of Social Gatherings with Friends and Family: More than three times a week  . Attends Religious  Services: More than 4 times per year  . Active Member of Clubs or Organizations: No  . Attends Archivist Meetings: Never  . Marital Status: Never married  Intimate Partner Violence: Not At Risk  . Fear of Current or Ex-Partner: No  . Emotionally Abused: No  . Physically Abused: No  . Sexually Abused: No   Family History  Problem Relation Age of Onset  . Diabetes Mother   . Hypertension Mother   . Irritable bowel syndrome Mother   . Diabetes Father   . Cancer Father        basal cell cancer  . Cancer Maternal Uncle        brain  . Crohn's disease Maternal Aunt   . Heart disease Maternal Grandmother   . Heart disease Maternal Grandfather   . Diabetes Maternal Grandfather     OBJECTIVE:  Vitals:   12/26/20 1630 12/26/20 1647 12/26/20 1648  BP: 135/77    Pulse: 83    Resp: 16    Temp: (!) 97.4 F (36.3 C)    TempSrc: Oral    SpO2: 98%  98%  Height:  5\' 4"  (1.626 m)      General appearance: alert; appears fatigued, but nontoxic; speaking in full sentences and tolerating own secretions HEENT: NCAT; Ears: EACs clear, TMs pearly gray; Eyes: PERRL.  EOM grossly intact. Sinuses: nontender; Nose: nares patent without rhinorrhea, Throat: oropharynx erythematous, cobblestoning present, tonsils non erythematous or enlarged, uvula midline  Neck: supple without LAD Lungs: unlabored respirations, symmetrical air entry; cough: mild; no respiratory distress; mild wheezing noted to bilateral bases Heart: regular rate and rhythm.  Radial pulses 2+ symmetrical bilaterally Skin: warm and dry Psychological: alert and cooperative; normal mood and affect  LABS:  No results found for this or any previous visit (from the past 24 hour(s)).   ASSESSMENT & PLAN:  1. Viral illness   2. Nasal congestion   3. Cough   4. Loss of taste   5. Wheezing    Reports that she has an inhaler that she has been using at home, does not need refills today Continue supportive care at  home COVID and flu testing ordered.  It will take between 1-2 days for test results.  Someone will contact you regarding abnormal results.   Work note provided Patient should remain in quarantine until they have received Covid results.  If negative you may resume normal activities (go back to work/school) while practicing hand hygiene, social distance, and mask wearing.  If positive, patient should remain in quarantine for 10 days from symptom onset AND greater than 72 hours after symptoms resolution (absence of fever without the use of fever-reducing medication and improvement in respiratory symptoms), whichever is longer Get plenty of rest and push fluids Use OTC zyrtec for nasal congestion, runny nose, and/or sore throat Use OTC flonase  for nasal congestion and runny nose Use medications daily for symptom relief Use OTC medications like ibuprofen or tylenol as needed fever or pain Call or go to the ED if you have any new or worsening symptoms such as fever, worsening cough, shortness of breath, chest tightness, chest pain, turning blue, changes in mental status.  Reviewed expectations re: course of current medical issues. Questions answered. Outlined signs and symptoms indicating need for more acute intervention. Patient verbalized understanding. After Visit Summary given.         Moshe Cipro, NP 12/28/20 2132

## 2020-12-26 NOTE — ED Triage Notes (Signed)
Pt c/o nasal congestion, loss of taste, cough and headache. Denies fever.

## 2020-12-26 NOTE — Discharge Instructions (Signed)
Your COVID and Flu tests are pending.  You should self quarantine until the test results are back.    Take Tylenol or ibuprofen as needed for fever or discomfort.  Rest and keep yourself hydrated.    Follow-up with your primary care provider if your symptoms are not improving.     

## 2020-12-27 ENCOUNTER — Other Ambulatory Visit: Payer: Medicaid Other

## 2020-12-27 ENCOUNTER — Other Ambulatory Visit: Payer: Self-pay

## 2020-12-27 ENCOUNTER — Ambulatory Visit (INDEPENDENT_AMBULATORY_CARE_PROVIDER_SITE_OTHER): Payer: Medicaid Other

## 2020-12-27 DIAGNOSIS — Z3403 Encounter for supervision of normal first pregnancy, third trimester: Secondary | ICD-10-CM

## 2020-12-27 DIAGNOSIS — Z3A37 37 weeks gestation of pregnancy: Secondary | ICD-10-CM | POA: Diagnosis not present

## 2020-12-27 DIAGNOSIS — O368930 Maternal care for other specified fetal problems, third trimester, not applicable or unspecified: Secondary | ICD-10-CM | POA: Diagnosis not present

## 2020-12-27 DIAGNOSIS — Q270 Congenital absence and hypoplasia of umbilical artery: Secondary | ICD-10-CM

## 2020-12-27 DIAGNOSIS — O283 Abnormal ultrasonic finding on antenatal screening of mother: Secondary | ICD-10-CM

## 2020-12-27 NOTE — Progress Notes (Signed)
[place] 37+6 wks,cephalic,posterior placenta gr 3,afi 12.5 cm,fhr 159 bpm,single umbilical artery,EFW 3521 g 77%

## 2020-12-28 ENCOUNTER — Telehealth: Payer: Self-pay | Admitting: Obstetrics & Gynecology

## 2020-12-28 ENCOUNTER — Encounter: Payer: Self-pay | Admitting: *Deleted

## 2020-12-28 LAB — COVID-19, FLU A+B NAA
Influenza A, NAA: NOT DETECTED
Influenza B, NAA: NOT DETECTED
SARS-CoV-2, NAA: DETECTED — AB

## 2020-12-28 NOTE — Telephone Encounter (Signed)
Pt is Covid +  Can we change her 12/30/2020 appt to a MyChart video visit or reschedule?  Please advise & call pt

## 2020-12-30 ENCOUNTER — Encounter: Payer: Self-pay | Admitting: Medical

## 2020-12-30 ENCOUNTER — Telehealth (INDEPENDENT_AMBULATORY_CARE_PROVIDER_SITE_OTHER): Payer: Medicaid Other | Admitting: Medical

## 2020-12-30 DIAGNOSIS — Z6791 Unspecified blood type, Rh negative: Secondary | ICD-10-CM

## 2020-12-30 DIAGNOSIS — O26893 Other specified pregnancy related conditions, third trimester: Secondary | ICD-10-CM | POA: Diagnosis not present

## 2020-12-30 DIAGNOSIS — Z3403 Encounter for supervision of normal first pregnancy, third trimester: Secondary | ICD-10-CM

## 2020-12-30 DIAGNOSIS — Q270 Congenital absence and hypoplasia of umbilical artery: Secondary | ICD-10-CM

## 2020-12-30 DIAGNOSIS — Z3A38 38 weeks gestation of pregnancy: Secondary | ICD-10-CM | POA: Diagnosis not present

## 2020-12-30 NOTE — Patient Instructions (Signed)
Fetal Movement Counts Patient Name: ________________________________________________ Patient Due Date: ____________________ What is a fetal movement count?  A fetal movement count is the number of times that you feel your baby move during a certain amount of time. This may also be called a fetal kick count. A fetal movement count is recommended for every pregnant woman. You may be asked to start counting fetal movements as early as week 28 of your pregnancy. Pay attention to when your baby is most active. You may notice your baby's sleep and wake cycles. You may also notice things that make your baby move more. You should do a fetal movement count:  When your baby is normally most active.  At the same time each day. A good time to count movements is while you are resting, after having something to eat and drink. How do I count fetal movements? 1. Find a quiet, comfortable area. Sit, or lie down on your side. 2. Write down the date, the start time and stop time, and the number of movements that you felt between those two times. Take this information with you to your health care visits. 3. Write down your start time when you feel the first movement. 4. Count kicks, flutters, swishes, rolls, and jabs. You should feel at least 10 movements. 5. You may stop counting after you have felt 10 movements, or if you have been counting for 2 hours. Write down the stop time. 6. If you do not feel 10 movements in 2 hours, contact your health care provider for further instructions. Your health care provider may want to do additional tests to assess your baby's well-being. Contact a health care provider if:  You feel fewer than 10 movements in 2 hours.  Your baby is not moving like he or she usually does. Date: ____________ Start time: ____________ Stop time: ____________ Movements: ____________ Date: ____________ Start time: ____________ Stop time: ____________ Movements: ____________ Date: ____________  Start time: ____________ Stop time: ____________ Movements: ____________ Date: ____________ Start time: ____________ Stop time: ____________ Movements: ____________ Date: ____________ Start time: ____________ Stop time: ____________ Movements: ____________ Date: ____________ Start time: ____________ Stop time: ____________ Movements: ____________ Date: ____________ Start time: ____________ Stop time: ____________ Movements: ____________ Date: ____________ Start time: ____________ Stop time: ____________ Movements: ____________ Date: ____________ Start time: ____________ Stop time: ____________ Movements: ____________ This information is not intended to replace advice given to you by your health care provider. Make sure you discuss any questions you have with your health care provider. Document Revised: 07/30/2019 Document Reviewed: 07/30/2019 Elsevier Patient Education  2020 Else[employer] Braxton Hicks Contractions Contractions of the uterus can occur throughout pregnancy, but they are not always a sign that you are in labor. You may have practice contractions called Braxton Hicks contractions. These false labor contractions are sometimes confused with true labor. What are Braxton Hicks contractions? Braxton Hicks contractions are tightening movements that occur in the muscles of the uterus before labor. Unlike true labor contractions, these contractions do not result in opening (dilation) and thinning of the cervix. Toward the end of pregnancy (32-34 weeks), Braxton Hicks contractions can happen more often and may become stronger. These contractions are sometimes difficult to tell apart from true labor because they can be very uncomfortable. You should not feel embarrassed if you go to the hospital with false labor. Sometimes, the only way to tell if you are in true labor is for your health care provider to look for changes in the cervix. The health care provider   will do a physical exam and may  monitor your contractions. If you are not in true labor, the exam should show that your cervix is not dilating and your water has not broken. If there are no other health problems associated with your pregnancy, it is completely safe for you to be sent home with false labor. You may continue to have Braxton Hicks contractions until you go into true labor. How to tell the difference between true labor and false labor True labor  Contractions last 30-70 seconds.  Contractions become very regular.  Discomfort is usually felt in the top of the uterus, and it spreads to the lower abdomen and low back.  Contractions do not go away with walking.  Contractions usually become more intense and increase in frequency.  The cervix dilates and gets thinner. False labor  Contractions are usually shorter and not as strong as true labor contractions.  Contractions are usually irregular.  Contractions are often felt in the front of the lower abdomen and in the groin.  Contractions may go away when you walk around or change positions while lying down.  Contractions get weaker and are shorter-lasting as time goes on.  The cervix usually does not dilate or become thin. Follow these instructions at home:   Take over-the-counter and prescription medicines only as told by your health care provider.  Keep up with your usual exercises and follow other instructions from your health care provider.  Eat and drink lightly if you think you are going into labor.  If Braxton Hicks contractions are making you uncomfortable: ? Change your position from lying down or resting to walking, or change from walking to resting. ? Sit and rest in a tub of warm water. ? Drink enough fluid to keep your urine pale yellow. Dehydration may cause these contractions. ? Do slow and deep breathing several times an hour.  Keep all follow-up prenatal visits as told by your health care provider. This is important. Contact a  health care provider if:  You have a fever.  You have continuous pain in your abdomen. Get help right away if:  Your contractions become stronger, more regular, and closer together.  You have fluid leaking or gushing from your vagina.  You pass blood-tinged mucus (bloody show).  You have bleeding from your vagina.  You have low back pain that you never had before.  You feel your baby's head pushing down and causing pelvic pressure.  Your baby is not moving inside you as much as it used to. Summary  Contractions that occur before labor are called Braxton Hicks contractions, false labor, or practice contractions.  Braxton Hicks contractions are usually shorter, weaker, farther apart, and less regular than true labor contractions. True labor contractions usually become progressively stronger and regular, and they become more frequent.  Manage discomfort from Braxton Hicks contractions by changing position, resting in a warm bath, drinking plenty of water, or practicing deep breathing. This information is not intended to replace advice given to you by your health care provider. Make sure you discuss any questions you have with your health care provider. Document Revised: 11/22/2017 Document Reviewed: 04/25/2017 Elsevier Patient Education  2020 Elsevier Inc.  

## 2020-12-30 NOTE — Progress Notes (Signed)
I connected with Amy [name] 12/30/20 at  9:50 AM EST by: MyChart video and verified that I am speaking with the correct person using two identifiers.  Patient is located at home and provider is located at [facility].     The purpose of this virtual visit is to provide medical care while limiting exposure to the novel coronavirus. I discussed the limitations, risks, security and privacy concerns of performing an evaluation and management service by MyChart video and the availability of in person appointments. I also discussed with the patient that there may be a patient responsible charge related to this service. By engaging in this virtual visit, you consent to the provision of healthcare.  Additionally, you authorize for your insurance to be billed for the services provided during this visit.  The patient expressed understanding and agreed to proceed.  The following staff members participated in the virtual visit:  Faith Rogue, LPN    PRENATAL VISIT NOTE  Subjective:  Amy [name] is a 25 y.o. G1P0 at [redacted]w[redacted]d  for phone visit for ongoing prenatal care.  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  Patient reports fatigue.  Contractions: Not present. Vag. Bleeding: None.  Movement: Present. Denies leaking of fluid.   The following portions of the patient's history were reviewed and updated as appropriate: allergies, current medications, past family history, past medical history, past social history, past surgical history and problem list.   Objective:  There were no vitals filed for this visit. Self-Obtained  Fetal Status:     Movement: Present     Assessment and Plan:  Pregnancy: G1P0 at  [redacted]w[redacted]d 1. Encounter for supervision of normal first pregnancy in third trimester - Doing well - Diagnosed with COVID 1/4 - Plan for IOL at 40 weeks, will schedule  - Does not have BP cuff, no HA or blurred vision today  2. Rh negative state in antepartum period - Had Rhogam 11/23 - Will have Rhogam PP depending on baby blood type  3. Two vessel cord  4. [redacted] weeks gestation of pregnancy  Term labor symptoms and general obstetric precautions including but not limited to vaginal bleeding, contractions, leaking of fluid and fetal movement were reviewed in detail with the patient.  Return in about 1 week (around 01/06/2021) for LOB, In-Person.  No future appointments.   Time spent on virtual visit: 15 minutes  Vonzella Nipple, PA-C

## 2020-12-31 ENCOUNTER — Inpatient Hospital Stay (HOSPITAL_BASED_OUTPATIENT_CLINIC_OR_DEPARTMENT_OTHER): Payer: Medicaid Other

## 2020-12-31 ENCOUNTER — Inpatient Hospital Stay (HOSPITAL_COMMUNITY): Admit: 2020-12-31 | Payer: Medicaid Other | Admitting: Family Medicine

## 2020-12-31 ENCOUNTER — Other Ambulatory Visit: Payer: Self-pay

## 2020-12-31 ENCOUNTER — Ambulatory Visit
Admission: EM | Admit: 2020-12-31 | Discharge: 2020-12-31 | Disposition: A | Discharge status: Home / Self Care | Payer: Medicaid Other

## 2020-12-31 ENCOUNTER — Inpatient Hospital Stay (EMERGENCY_DEPARTMENT_HOSPITAL)
Admission: AD | Admit: 2020-12-31 | Discharge: 2020-12-31 | Disposition: A | Discharge status: Home / Self Care | Payer: Medicaid Other | Source: Home / Self Care | Attending: Family Medicine | Admitting: Family Medicine

## 2020-12-31 ENCOUNTER — Ambulatory Visit: Admit: 2020-12-31 | Discharge status: Absent without leave | Payer: Medicaid Other

## 2020-12-31 ENCOUNTER — Inpatient Hospital Stay (HOSPITAL_COMMUNITY)
Admission: AD | Admit: 2020-12-31 | Discharge: 2020-12-31 | Disposition: A | Discharge status: Home / Self Care | Payer: Medicaid Other | Attending: Family Medicine | Admitting: Family Medicine

## 2020-12-31 DIAGNOSIS — Z7951 Long term (current) use of inhaled steroids: Secondary | ICD-10-CM | POA: Insufficient documentation

## 2020-12-31 DIAGNOSIS — O26893 Other specified pregnancy related conditions, third trimester: Secondary | ICD-10-CM | POA: Diagnosis present

## 2020-12-31 DIAGNOSIS — Z3689 Encounter for other specified antenatal screening: Secondary | ICD-10-CM | POA: Diagnosis not present

## 2020-12-31 DIAGNOSIS — O99513 Diseases of the respiratory system complicating pregnancy, third trimester: Secondary | ICD-10-CM | POA: Insufficient documentation

## 2020-12-31 DIAGNOSIS — O288 Other abnormal findings on antenatal screening of mother: Secondary | ICD-10-CM

## 2020-12-31 DIAGNOSIS — O98513 Other viral diseases complicating pregnancy, third trimester: Secondary | ICD-10-CM | POA: Insufficient documentation

## 2020-12-31 DIAGNOSIS — Z3A38 38 weeks gestation of pregnancy: Secondary | ICD-10-CM

## 2020-12-31 DIAGNOSIS — Z369 Encounter for antenatal screening, unspecified: Secondary | ICD-10-CM

## 2020-12-31 DIAGNOSIS — O368130 Decreased fetal movements, third trimester, not applicable or unspecified: Secondary | ICD-10-CM | POA: Insufficient documentation

## 2020-12-31 DIAGNOSIS — U071 COVID-19: Secondary | ICD-10-CM | POA: Insufficient documentation

## 2020-12-31 DIAGNOSIS — J45909 Unspecified asthma, uncomplicated: Secondary | ICD-10-CM | POA: Insufficient documentation

## 2020-12-31 DIAGNOSIS — J1282 Pneumonia due to coronavirus disease 2019: Secondary | ICD-10-CM | POA: Diagnosis not present

## 2020-12-31 MED ORDER — FLUTICASONE PROPIONATE 50 MCG/ACT NA SUSP: 1.0000 | Freq: Two times a day (BID) | NASAL | 2 refills | Status: DC

## 2020-12-31 NOTE — MAU Note (Signed)
Pt reports to [facility] via ems with c/o dfm.  Pt states she is "only feeling baby move 5-6 times an hour and was told it should be 10 times"  Pt denies ctx, LOF, or vag bleeding. FHR 150 during triage.

## 2020-12-31 NOTE — MAU Provider Note (Signed)
History     CSN: [id number]  Arrival date and time: 12/31/20 1249   Event Date/Time   First Provider Initiated Contact with Patient 12/31/20 1325      Chief Complaint  Patient presents with  . Decreased Fetal Movement   HPI Amy [name] is a 25 y.o. G1P0 at [redacted]w[redacted]d who presents with decreased fetal movement. She went to [facility] and they sent her via EMS to [facility]. She states for the last 1-2 hours she has felt some flutters but not normal movement. She denies any abdominal pain, vaginal bleeding or leaking of fluid. She states she was diagnosed with COVID on 1/4. She reports some intermittent shortness of breath but it is not worse than it has been since she was diagnosed.  OB History    Gravida  1   Para      Term      Preterm      AB      Living        SAB      IAB      Ectopic      Multiple      Live Births              Past Medical History:  Diagnosis Date  . Asthma   . Migraines   . Narcolepsy   . Pregnancy 12/27/2019  . PTSD (post-traumatic stress disorder)   . Seasonal allergies     Past Surgical History:  Procedure Laterality Date  . CHOLECYSTECTOMY N/A 06/20/2020   Procedure: LAPAROSCOPIC CHOLECYSTECTOMY;  Surgeon: Franky Macho, MD;  Location: [facility];  Service: General;  Laterality: N/A;  . TONSILLECTOMY    . WISDOM TOOTH EXTRACTION      Family History  Problem Relation Age of Onset  . Diabetes Mother   . Hypertension Mother   . Irritable bowel syndrome Mother   . Diabetes Father   . Cancer Father        basal cell cancer  . Cancer Maternal Uncle        brain  . Crohn's disease Maternal Aunt   . Heart disease Maternal Grandmother   . Heart disease Maternal Grandfather   . Diabetes Maternal Grandfather     Social History   Tobacco Use  . Smoking status: Never Smoker  . Smokeless tobacco: Never Used  Vaping Use  . Vaping Use: Never used  Substance Use Topics  . Alcohol use: Not Currently    Comment:  occ  . Drug use: No    Allergies:  Allergies  Allergen Reactions  . Cough Syrup [Guaifenesin] Nausea And Vomiting  . Other Nausea And Vomiting    Any coated medications    Medications Prior to Admission  Medication Sig Dispense Refill Last Dose  . albuterol (VENTOLIN HFA) 108 (90 Base) MCG/ACT inhaler Inhale 2-4 puffs by mouth every 4 hours as needed for wheezing, cough, and/or shortness of breath 6.7 g 0   . diphenhydrAMINE (BENADRYL) 25 MG tablet Take 25 mg by mouth every 6 (six) hours as needed.     [name] ipratropium-albuterol (DUONEB) 0.5-2.5 (3) MG/3ML SOLN Take 3 mLs by nebulization every 6 (six) hours. 360 mL 5   . loratadine (CLARITIN) 10 MG tablet Take 1 tablet (10 mg total) by mouth daily. 30 tablet 5   . mometasone-formoterol (DULERA) 100-5 MCG/ACT AERO INHALE 2 PUFFS BY MOUTH TWICE DAILY. RINSE MOUTH AFTER EACH USE (Patient taking differently: Inhale 2 puffs into the lungs in the  morning and at bedtime. INHALE 2 PUFFS BY MOUTH TWICE DAILY. RINSE MOUTH AFTER EACH USE) 39 g 1   . montelukast (SINGULAIR) 10 MG tablet Take 1 tablet (10 mg total) by mouth at bedtime. 30 tablet 3     Review of Systems  Constitutional: Negative.  Negative for fatigue and fever.  HENT: Negative.   Respiratory: Negative.  Negative for shortness of breath.   Cardiovascular: Negative.  Negative for chest pain.  Gastrointestinal: Negative.  Negative for abdominal pain, constipation, diarrhea, nausea and vomiting.  Genitourinary: Negative.  Negative for dysuria, vaginal bleeding and vaginal discharge.  Neurological: Negative.  Negative for dizziness and headaches.   Physical Exam   Blood pressure 128/90, temperature 97.6 F (36.4 C), temperature source Oral, resp. rate 18, last menstrual period 03/24/2020, SpO2 98 %.  Physical Exam Vitals and nursing note reviewed.  Constitutional:      General: She is not in acute distress.    Appearance: She is well-developed and well-nourished.  HENT:      Head: Normocephalic.  Eyes:     Pupils: Pupils are equal, round, and reactive to light.  Cardiovascular:     Rate and Rhythm: Normal rate and regular rhythm.     Heart sounds: Normal heart sounds.  Pulmonary:     Effort: Pulmonary effort is normal. No respiratory distress.     Breath sounds: Normal breath sounds.  Abdominal:     General: Bowel sounds are normal. There is no distension.     Palpations: Abdomen is soft.     Tenderness: There is no abdominal tenderness.  Skin:    General: Skin is warm and dry.  Neurological:     Mental Status: She is alert and oriented to person, place, and time.  Psychiatric:        Mood and Affect: Mood and affect normal.        Behavior: Behavior normal.        Thought Content: Thought content normal.        Judgment: Judgment normal.     MAU Course  Procedures  MDM NST reactive but patient still stating she is not feeling movement [place] MFM BPP- 8/8 with normal AFI  Assessment and Plan   1. NST (non-stress test) reactive   2. [redacted] weeks gestation of pregnancy   3. COVID-19 affecting pregnancy in third trimester    -Discharge home in stable condition -Fetal Kick count precautions discussed -Patient advised to follow-up with OB as scheduled for prenatal care -Patient may return to MAU as needed or if her condition were to change or worsen  Rolm Bookbinder CNM 12/31/2020, 1:26 PM

## 2020-12-31 NOTE — Discharge Instructions (Signed)
Safe Medications in Pregnancy   Acne: Benzoyl Peroxide Salicylic Acid  Backache/Headache: Tylenol: 2 regular strength every 4 hours OR              2 Extra strength every 6 hours  Colds/Coughs/Allergies: Benadryl (alcohol free) 25 mg every 6 hours as needed Breath right strips Claritin Cepacol throat lozenges Chloraseptic throat spray Cold-Eeze- up to three times per day Cough drops, alcohol free Flonase (by prescription only) Guaifenesin Mucinex Robitussin DM (plain only, alcohol free) Saline nasal spray/drops Sudafed (pseudoephedrine) & Actifed ** use only after [redacted] weeks gestation and if you do not have high blood pressure Tylenol Vicks Vaporub Zinc lozenges Zyrtec   Constipation: Colace Ducolax suppositories Fleet enema Glycerin suppositories Metamucil Milk of magnesia Miralax Senokot Smooth move tea  Diarrhea: Kaopectate Imodium A-D  *NO pepto Bismol  Hemorrhoids: Anusol Anusol HC Preparation H Tucks  Indigestion: Tums Maalox Mylanta Zantac  Pepcid  Insomnia: Benadryl (alcohol free) 25mg every 6 hours as needed Tylenol PM Unisom, no Gelcaps  Leg Cramps: Tums MagGel  Nausea/Vomiting:  Bonine Dramamine Emetrol Ginger extract Sea bands Meclizine  Nausea medication to take during pregnancy:  Unisom (doxylamine succinate 25 mg tablets) Take one tablet daily at bedtime. If symptoms are not adequately controlled, the dose can be increased to a maximum recommended dose of two tablets daily (1/2 tablet in the morning, 1/2 tablet mid-afternoon and one at bedtime). Vitamin B6 100mg tablets. Take one tablet twice a day (up to 200 mg per day).  Skin Rashes: Aveeno products Benadryl cream or 25mg every 6 hours as needed Calamine Lotion 1% cortisone cream  Yeast infection: Gyne-lotrimin 7 Monistat 7   **If taking multiple medications, please check labels to avoid duplicating the same active ingredients **take medication as directed on  the label ** Do not exceed 4000 mg of tylenol in 24 hours **Do not take medications that contain aspirin or ibuprofen     10 Things You Can Do to Manage Your COVID-19 Symptoms at Home If you have possible or confirmed COVID-19: 1. Stay home from work and school. And stay away from other public places. If you must go out, avoid using any kind of public transportation, ridesharing, or taxis. 2. Monitor your symptoms carefully. If your symptoms get worse, call your healthcare provider immediately. 3. Get rest and stay hydrated. 4. If you have a medical appointment, call the healthcare provider ahead of time and tell them that you have or may have COVID-19. 5. For medical emergencies, call 911 and notify the dispatch personnel that you have or may have COVID-19. 6. Cover your cough and sneezes with a tissue or use the inside of your elbow. 7. Wash your hands often with soap and water for at least 20 seconds or clean your hands with an alcohol-based hand sanitizer that contains at least 60% alcohol. 8. As much as possible, stay in a specific room and away from other people in your home. Also, you should use a separate bathroom, if available. If you need to be around other people in or outside of the home, wear a mask. 9. Avoid sharing personal items with other people in your household, like dishes, towels, and bedding. 10. Clean all surfaces that are touched often, like counters, tabletops, and doorknobs. Use household cleaning sprays or wipes according to the label instructions. cdc.gov/coronavirus 06/24/2019 This information is not intended to replace advice given to you by your health care provider. Make sure you discuss any questions you have with your   health care provider. Document Revised: 11/26/2019 Document Reviewed: 11/26/2019 Elsevier Patient Education  [place].

## 2020-12-31 NOTE — ED Triage Notes (Signed)
Pt placed on 2L nasal cannula.

## 2020-12-31 NOTE — MAU Provider Note (Signed)
None     S Ms. Sarah Matchinis Rittenour is a 25 y.o. G1P0 patient who presents to MAU today. She was just seen by one of my colleagues and discharged. She returns because she feels like she could fall at home because she feels weak and SOB when she is walking because of her COVID.   O BP 139/84 (BP Location: Right Arm)   Pulse 87   Temp 98.2 F (36.8 C) (Oral)   Resp 18   LMP 03/24/2020 Comment: pt is [redacted] weeks pregnant  SpO2 100% Comment: room air Physical Exam Vitals reviewed.  Constitutional:      Appearance: Normal appearance.  Cardiovascular:     Rate and Rhythm: Normal rate.     Pulses: Normal pulses.  Abdominal:     General: Abdomen is flat.     Palpations: Abdomen is soft.  Neurological:     General: No focal deficit present.     Mental Status: She is alert.  Psychiatric:        Mood and Affect: Mood normal.        Behavior: Behavior normal.        Thought Content: Thought content normal.        Judgment: Judgment normal.     A Medical screening exam complete COVID pneumonia [redacted] weeks gestation  P Explained that her oxygen level is normal and appropriate. Educated patient on COVID illness, including body aches, dyspnea on exertion, etc. Recommended flonase, vitamin C, vitamin D, Zinc. Continue use of inhalers at home. Recommended moving short distances. Return if SOB at rest or any other pregnancy complaint. Discharge from MAU in stable condition Warning signs for worsening condition that would warrant emergency follow-up discussed Patient may return to MAU as needed   Levie Heritage, DO 12/31/2020 5:04 PM

## 2020-12-31 NOTE — MAU Note (Signed)
Amy [name] is a 25 y.o. at [redacted]w[redacted]d here in MAU reporting: pt states that since she has covid she is worried about falling at home.  Pain score: 0/10  Vitals:   12/31/20 1642  BP: 139/84  Pulse: 87  Resp: 18  Temp: 98.2 F (36.8 C)  SpO2: 100%     FHT: 152  Lab orders placed from triage: none

## 2020-12-31 NOTE — ED Triage Notes (Signed)
Patient is being discharged from the Urgent Care and sent to the Emergency Department via ems  . Per Amy [name], patient is in need of higher level of care  To rule out  fetal distress . Patient is aware and verbalizes understanding of plan of care. There were no vitals filed for this visit.

## 2020-12-31 NOTE — MAU Note (Signed)
Pt taken back to [facility] via wheelchair at her request so that she can be with her family member.

## 2020-12-31 NOTE — ED Triage Notes (Signed)
Pt presents with severe fatigue and is having difficulty ambulating, states she is unable to feel baby moving much. Pt due jan 19

## 2021-01-02 ENCOUNTER — Other Ambulatory Visit: Payer: Self-pay | Admitting: Women's Health

## 2021-01-02 ENCOUNTER — Telehealth: Payer: Self-pay | Admitting: Women's Health

## 2021-01-02 MED ORDER — PROMETHAZINE HCL 25 MG PO TABS: 12.5000 mg | ORAL_TABLET | Freq: Four times a day (QID) | ORAL | 0 refills | Status: DC | PRN

## 2021-01-02 NOTE — Telephone Encounter (Signed)
Pt is vomiting today, just one time. Pt feels like it was bile. Pt did have some stomach pain but that is feeling better now. Pt don't have any nausea meds at home. Can you send something to her pharmacy? Thanks!! JSY

## 2021-01-02 NOTE — Telephone Encounter (Signed)
Pt called concerned with vomiting up what she thinks is bile - just started today  Some mid abd pain that started with the vomiting, 5 on scale of 1-10 No available appointments, please advise & notify pt  (she has appointment 1/12 but she didn't feel that this should wait)

## 2021-01-04 ENCOUNTER — Other Ambulatory Visit (HOSPITAL_COMMUNITY)
Admission: RE | Admit: 2021-01-04 | Discharge: 2021-01-04 | Disposition: A | Discharge status: Home / Self Care | Payer: Medicaid Other | Source: Ambulatory Visit | Attending: Advanced Practice Midwife | Admitting: Advanced Practice Midwife

## 2021-01-04 ENCOUNTER — Other Ambulatory Visit: Payer: Self-pay

## 2021-01-04 ENCOUNTER — Ambulatory Visit (INDEPENDENT_AMBULATORY_CARE_PROVIDER_SITE_OTHER): Payer: Medicaid Other | Admitting: Advanced Practice Midwife

## 2021-01-04 VITALS — BP 114/70 | HR 91 | Wt 196.0 lb

## 2021-01-04 DIAGNOSIS — Z331 Pregnant state, incidental: Secondary | ICD-10-CM

## 2021-01-04 DIAGNOSIS — U071 COVID-19: Secondary | ICD-10-CM

## 2021-01-04 DIAGNOSIS — Z3403 Encounter for supervision of normal first pregnancy, third trimester: Secondary | ICD-10-CM

## 2021-01-04 DIAGNOSIS — Q270 Congenital absence and hypoplasia of umbilical artery: Secondary | ICD-10-CM

## 2021-01-04 DIAGNOSIS — Z6791 Unspecified blood type, Rh negative: Secondary | ICD-10-CM

## 2021-01-04 DIAGNOSIS — Z3A39 39 weeks gestation of pregnancy: Secondary | ICD-10-CM | POA: Insufficient documentation

## 2021-01-04 DIAGNOSIS — O26899 Other specified pregnancy related conditions, unspecified trimester: Secondary | ICD-10-CM

## 2021-01-04 DIAGNOSIS — O98513 Other viral diseases complicating pregnancy, third trimester: Secondary | ICD-10-CM

## 2021-01-04 DIAGNOSIS — Z1389 Encounter for screening for other disorder: Secondary | ICD-10-CM

## 2021-01-04 NOTE — Progress Notes (Signed)
   PRENATAL VISIT NOTE  Subjective:  Amy [name] is a 25 y.o. G1P0 at [redacted]w[redacted]d being seen today for ongoing prenatal care.  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  Patient reports no complaints. Her mother was released from the hospital today. Patient has confirmed transportation to hospital for her IOL. She will be living with her mom and providing care in the postpartum period. Contractions: Irritability. Vag. Bleeding: None.  Movement: Present. Denies leaking of fluid.   The following portions of the patient's history were reviewed and updated as appropriate: allergies, current medications, past family history, past medical history, past social history, past surgical history and problem list. Problem list updated.  Objective:   Vitals:   01/04/21 1348  BP: 114/70  Pulse: 91  Weight: 196 lb (88.9 kg)    Fetal Status: Fetal Heart Rate (bpm): 150 Fundal Height: 38 cm Movement: Present  Presentation: Vertex  General:  Alert, oriented and cooperative. Patient is in no acute distress.  Skin: Skin is warm and dry. No rash noted.   Cardiovascular: Normal heart rate noted  Respiratory: Normal respiratory effort, no problems with respiration noted  Abdomen: Soft, gravid, appropriate for gestational age.  Pain/Pressure: Present     Pelvic: Cervical exam performed Dilation: 1 Effacement (%): Thick Station: -3,-2  Extremities: Normal range of motion.  Edema: Trace  Mental Status: Normal mood and affect. Normal behavior. Normal judgment and thought content.   Assessment and Plan:  Pregnancy: G1P0 at [redacted]w[redacted]d  1. Encounter for supervision of normal first pregnancy in third trimester - Routine care -  Declines membrane sweep - Previously unable to collect swab r/t COVID quarantine - POC Urinalysis [facility] - Culture, beta strep (group b only) - Cervicovaginal ancillary only( [place])  2. [redacted] weeks gestation of pregnancy - POC Urinalysis [facility] - Culture, beta strep (group b only) - Cervicovaginal ancillary only( [place])  3. Screening for genitourinary condition - POC Urinalysis [facility]  4. Two vessel cord - IOL scheduled 01/11/2021, arrive 2345 on 01/10/2021  - Orders previously placed by Vonzella Nipple, PA  5. Rh negative state in antepartum period - S/p Rhogam 11/15/2020 - PP Rhogam eval  6. COVID-19 affecting pregnancy in third trimester - No residual complaints or concerns  Term labor symptoms and general obstetric precautions including but not limited to vaginal bleeding, contractions, leaking of fluid and fetal movement were reviewed in detail with the patient. Please refer to After Visit Summary for other counseling recommendations.  No follow-ups on file.  Future Appointments  Date Time Provider Department Center  01/11/2021 12:00 AM [facility] SCHED ROOM [facility] None    Calvert Cantor, CNM

## 2021-01-04 NOTE — Patient Instructions (Signed)
Labor Induction Labor induction is when steps are taken to cause a pregnant woman to begin the labor process. Most women go into labor on their own between 37 weeks and 42 weeks of pregnancy. When this does not happen, or when there is a medical need for labor to begin, steps may be taken to induce, or bring on, labor. Labor induction causes a pregnant woman's uterus to contract. It also causes the cervix to soften (ripen), open (dilate), and thin out. Usually, labor is not induced before 39 weeks of pregnancy unless there is a medical reason to do so. When is labor induction considered? Labor induction may be right for you if:  Your pregnancy lasts longer than 41 to 42 weeks.  Your placenta is separating from your uterus (placental abruption).  You have a rupture of membranes and your labor does not begin.  You have health problems, like diabetes or high blood pressure (preeclampsia) during your pregnancy.  Your baby has stopped growing or does not have enough amniotic fluid. Before labor induction begins, your health care provider will consider the following factors:  Your medical condition and the baby's condition.  How many weeks you have been pregnant.  How mature the baby's lungs are.  The condition of your cervix.  The position of the baby.  The size of your birth canal. Tell a health care provider about:  Any allergies you have.  All medicines you are taking, including vitamins, herbs, eye drops, creams, and over-the-counter medicines.  Any problems you or your family members have had with anesthetic medicines.  Any surgeries you have had.  Any blood disorders you have.  Any medical conditions you have. What are the risks? Generally, this is a safe procedure. However, problems may occur, including:  Failed induction.  Changes in fetal heart rate, such as being too high, too low, or irregular (erratic).  Infection in the mother or the baby.  Increased risk of  having a cesarean delivery.  Breaking off (abruption) of the placenta from the uterus. This is rare.  Rupture of the uterus. This is very rare.  Your baby could fail to get enough blood flow or oxygen. This can be life-threatening. When induction is needed for medical reasons, the benefits generally outweigh the risks. What happens during the procedure? During the procedure, your health care provider will use one of these methods to induce labor:  Stripping the membranes. In this method, the amniotic sac tissue is gently separated from the cervix. This causes the following to happen: ? Your cervix stretches, which in turn causes the release of prostaglandins. ? Prostaglandins induce labor and cause the uterus to contract. ? This procedure is often done in an office visit. You will be sent home to wait for contractions to begin.  Prostaglandin medicine. This medicine starts contractions and causes the cervix to dilate and ripen. This can be taken by mouth (orally) or by being inserted into the vagina (suppository).  Inserting a small, thin tube (catheter) with a balloon into the vagina and then expanding the balloon with water to dilate the cervix.  Breaking the water. In this method, a small instrument is used to make a small hole in the amniotic sac. This eventually causes the amniotic sac to break. Contractions should begin within a few hours.  Medicine to trigger or strengthen contractions. This medicine is given through an IV that is inserted into a vein in your arm. This procedure may vary among health care providers and hospitals.     Where to find more information  [employer] of [employer]: www.marchofdimes.org  The American College of Obstetricians and Gynecologists: www.acog.org Summary  Labor induction causes a pregnant woman's uterus to contract. It also causes the cervix to soften (ripen), open (dilate), and thin out.  Labor is usually not induced before 39 weeks of pregnancy unless  there is a medical reason to do so.  When induction is needed for medical reasons, the benefits generally outweigh the risks.  Talk with your health care provider about which methods of labor induction are right for you. This information is not intended to replace advice given to you by your health care provider. Make sure you discuss any questions you have with your health care provider. Document Revised: 09/22/2020 Document Reviewed: 09/22/2020 Elsevier Patient Education  2021 Elsevier Inc.  

## 2021-01-05 ENCOUNTER — Telehealth (HOSPITAL_COMMUNITY): Payer: Self-pay | Admitting: *Deleted

## 2021-01-05 ENCOUNTER — Encounter (HOSPITAL_COMMUNITY): Payer: Self-pay | Admitting: *Deleted

## 2021-01-05 NOTE — Telephone Encounter (Signed)
Preadmission screen  

## 2021-01-06 ENCOUNTER — Other Ambulatory Visit: Payer: Self-pay | Admitting: Advanced Practice Midwife

## 2021-01-06 LAB — CERVICOVAGINAL ANCILLARY ONLY
Chlamydia: NEGATIVE
Comment: NEGATIVE
Comment: NORMAL
Neisseria Gonorrhea: NEGATIVE

## 2021-01-08 LAB — CULTURE, BETA STREP (GROUP B ONLY): Strep Gp B Culture: NEGATIVE

## 2021-01-10 ENCOUNTER — Inpatient Hospital Stay (HOSPITAL_COMMUNITY)
Admission: AD | Admit: 2021-01-10 | Discharge: 2021-01-12 | DRG: 807 | Disposition: A | Discharge status: Home / Self Care | Payer: Medicaid Other | Attending: Obstetrics and Gynecology | Admitting: Obstetrics and Gynecology

## 2021-01-10 ENCOUNTER — Inpatient Hospital Stay (HOSPITAL_COMMUNITY): Payer: Medicaid Other | Admitting: Anesthesiology

## 2021-01-10 ENCOUNTER — Other Ambulatory Visit: Payer: Self-pay

## 2021-01-10 ENCOUNTER — Encounter (HOSPITAL_COMMUNITY): Payer: Self-pay | Admitting: Obstetrics and Gynecology

## 2021-01-10 DIAGNOSIS — N83201 Unspecified ovarian cyst, right side: Secondary | ICD-10-CM | POA: Diagnosis present

## 2021-01-10 DIAGNOSIS — F419 Anxiety disorder, unspecified: Secondary | ICD-10-CM | POA: Diagnosis present

## 2021-01-10 DIAGNOSIS — O324XX0 Maternal care for high head at term, not applicable or unspecified: Secondary | ICD-10-CM | POA: Diagnosis present

## 2021-01-10 DIAGNOSIS — O26893 Other specified pregnancy related conditions, third trimester: Secondary | ICD-10-CM | POA: Diagnosis present

## 2021-01-10 DIAGNOSIS — Z6791 Unspecified blood type, Rh negative: Secondary | ICD-10-CM

## 2021-01-10 DIAGNOSIS — Z3A39 39 weeks gestation of pregnancy: Secondary | ICD-10-CM | POA: Diagnosis not present

## 2021-01-10 DIAGNOSIS — J45909 Unspecified asthma, uncomplicated: Secondary | ICD-10-CM | POA: Diagnosis present

## 2021-01-10 DIAGNOSIS — O9952 Diseases of the respiratory system complicating childbirth: Secondary | ICD-10-CM | POA: Diagnosis present

## 2021-01-10 DIAGNOSIS — O480 Post-term pregnancy: Secondary | ICD-10-CM | POA: Diagnosis present

## 2021-01-10 DIAGNOSIS — Q270 Congenital absence and hypoplasia of umbilical artery: Secondary | ICD-10-CM

## 2021-01-10 DIAGNOSIS — F4310 Post-traumatic stress disorder, unspecified: Secondary | ICD-10-CM | POA: Diagnosis present

## 2021-01-10 DIAGNOSIS — Z3400 Encounter for supervision of normal first pregnancy, unspecified trimester: Secondary | ICD-10-CM

## 2021-01-10 DIAGNOSIS — O26899 Other specified pregnancy related conditions, unspecified trimester: Secondary | ICD-10-CM

## 2021-01-10 DIAGNOSIS — Z9049 Acquired absence of other specified parts of digestive tract: Secondary | ICD-10-CM

## 2021-01-10 DIAGNOSIS — Z3403 Encounter for supervision of normal first pregnancy, third trimester: Secondary | ICD-10-CM

## 2021-01-10 DIAGNOSIS — Z8616 Personal history of COVID-19: Secondary | ICD-10-CM | POA: Diagnosis not present

## 2021-01-10 DIAGNOSIS — N83209 Unspecified ovarian cyst, unspecified side: Secondary | ICD-10-CM | POA: Diagnosis present

## 2021-01-10 LAB — CBC
HCT: 38 % (ref 36.0–46.0)
Hemoglobin: 12.9 g/dL (ref 12.0–15.0)
MCH: 28.9 pg (ref 26.0–34.0)
MCHC: 33.9 g/dL (ref 30.0–36.0)
MCV: 85 fL (ref 80.0–100.0)
Platelets: 137 10*3/uL — ABNORMAL LOW (ref 150–400)
RBC: 4.47 MIL/uL (ref 3.87–5.11)
RDW: 13.2 % (ref 11.5–15.5)
WBC: 13.2 10*3/uL — ABNORMAL HIGH (ref 4.0–10.5)
nRBC: 0 % (ref 0.0–0.2)

## 2021-01-10 LAB — TYPE AND SCREEN
ABO/RH(D): O NEG
Antibody Screen: POSITIVE

## 2021-01-10 MED ORDER — DIPHENHYDRAMINE HCL 50 MG/ML IJ SOLN: 12.5000 mg | INTRAMUSCULAR | Status: DC | PRN

## 2021-01-10 MED ORDER — OXYTOCIN BOLUS FROM INFUSION
333.0000 mL | Freq: Once | INTRAVENOUS | Status: AC
  Administered 2021-01-11: 333 mL via INTRAVENOUS

## 2021-01-10 MED ORDER — ACETAMINOPHEN 325 MG PO TABS
650.0000 mg | ORAL_TABLET | ORAL | Status: DC | PRN
  Administered 2021-01-10: 650 mg via ORAL
  Filled 2021-01-10: qty 2

## 2021-01-10 MED ORDER — OXYCODONE-ACETAMINOPHEN 5-325 MG PO TABS: 1.0000 | ORAL_TABLET | ORAL | Status: DC | PRN

## 2021-01-10 MED ORDER — OXYTOCIN-SODIUM CHLORIDE 30-0.9 UT/500ML-% IV SOLN
2.5000 [IU]/h | INTRAVENOUS | Status: DC
  Filled 2021-01-10: qty 500

## 2021-01-10 MED ORDER — FENTANYL-BUPIVACAINE-NACL 0.5-0.125-0.9 MG/250ML-% EP SOLN
12.0000 mL/h | EPIDURAL | Status: DC | PRN
  Filled 2021-01-10: qty 250

## 2021-01-10 MED ORDER — LACTATED RINGERS IV SOLN: 500.0000 mL | Freq: Once | INTRAVENOUS | Status: DC

## 2021-01-10 MED ORDER — ONDANSETRON HCL 4 MG/2ML IJ SOLN
4.0000 mg | Freq: Four times a day (QID) | INTRAMUSCULAR | Status: DC | PRN
  Administered 2021-01-10 – 2021-01-11 (×2): 4 mg via INTRAVENOUS
  Filled 2021-01-10 (×2): qty 2

## 2021-01-10 MED ORDER — OXYCODONE-ACETAMINOPHEN 5-325 MG PO TABS: 2.0000 | ORAL_TABLET | ORAL | Status: DC | PRN

## 2021-01-10 MED ORDER — PHENYLEPHRINE 40 MCG/ML (10ML) SYRINGE FOR IV PUSH (FOR BLOOD PRESSURE SUPPORT)
80.0000 ug | PREFILLED_SYRINGE | INTRAVENOUS | Status: AC | PRN
  Administered 2021-01-10 (×3): 80 ug via INTRAVENOUS
  Filled 2021-01-10: qty 10

## 2021-01-10 MED ORDER — OXYTOCIN-SODIUM CHLORIDE 30-0.9 UT/500ML-% IV SOLN
1.0000 m[IU]/min | INTRAVENOUS | Status: DC
  Administered 2021-01-10: 2 m[IU]/min via INTRAVENOUS

## 2021-01-10 MED ORDER — LACTATED RINGERS IV SOLN: INTRAVENOUS | Status: DC

## 2021-01-10 MED ORDER — EPHEDRINE 5 MG/ML INJ: 10.0000 mg | INTRAVENOUS | Status: DC | PRN

## 2021-01-10 MED ORDER — LIDOCAINE HCL (PF) 1 % IJ SOLN
INTRAMUSCULAR | Status: DC | PRN
  Administered 2021-01-10: 2 mL via EPIDURAL
  Administered 2021-01-10: 5 mL via EPIDURAL
  Administered 2021-01-10: 3 mL via EPIDURAL

## 2021-01-10 MED ORDER — SOD CITRATE-CITRIC ACID 500-334 MG/5ML PO SOLN: 30.0000 mL | ORAL | Status: DC | PRN

## 2021-01-10 MED ORDER — SODIUM CHLORIDE (PF) 0.9 % IJ SOLN
INTRAMUSCULAR | Status: DC | PRN
  Administered 2021-01-10: 12 mL/h via EPIDURAL

## 2021-01-10 MED ORDER — TERBUTALINE SULFATE 1 MG/ML IJ SOLN: 0.2500 mg | Freq: Once | INTRAMUSCULAR | Status: DC | PRN

## 2021-01-10 MED ORDER — MISOPROSTOL 25 MCG QUARTER TABLET: 25.0000 ug | ORAL_TABLET | ORAL | Status: DC | PRN

## 2021-01-10 MED ORDER — BUTORPHANOL TARTRATE 1 MG/ML IJ SOLN
1.0000 mg | Freq: Once | INTRAMUSCULAR | Status: AC
  Administered 2021-01-10: 1 mg via INTRAVENOUS
  Filled 2021-01-10: qty 1

## 2021-01-10 MED ORDER — PHENYLEPHRINE 40 MCG/ML (10ML) SYRINGE FOR IV PUSH (FOR BLOOD PRESSURE SUPPORT): 80.0000 ug | PREFILLED_SYRINGE | INTRAVENOUS | Status: DC | PRN

## 2021-01-10 MED ORDER — LACTATED RINGERS IV SOLN: 500.0000 mL | INTRAVENOUS | Status: DC | PRN

## 2021-01-10 MED ORDER — LIDOCAINE HCL (PF) 1 % IJ SOLN: 30.0000 mL | INTRAMUSCULAR | Status: DC | PRN

## 2021-01-10 NOTE — Progress Notes (Signed)
Labor Progress Note Amy [name] is a 25 y.o. G1P0 at [redacted]w[redacted]d presented in latent labor.  S: Comfortable with epidural, without complaints.  O:  BP 117/76   Pulse 73   Temp 97.6 F (36.4 C) (Oral)   Resp 17   Wt 88.9 kg   LMP 03/24/2020 Comment: pt is [redacted] weeks pregnant  SpO2 98%   BMI 33.64 kg/m  EFM: baseline 140 bpm/ min-mod variability/ + accels/no decels Toco/IUPC: q1-5 min SVE: Dilation: Lip/rim Effacement (%): 100 Station: Plus 1,Plus 2 Presentation: Vertex Exam by:: Dr. Leary Roca  A/P: 25 y.o. G1P0 [redacted]w[redacted]d presented in latent labor. 1. Labor: S/p AROM/IUPC. Currently on pitocin. Patient has progressed to complete, given G1 with cervical lip will wait 1 hour and then initiate practice pushing. 2. FWB: Cat I 3. Pain: epidural 4. SUA: placental to pathology 5. PTSD: SW postpartum 6. Rh neg: received rhogam 11/15/20. Rhogam eval pp.  Alric Seton, MD 10:06 PM

## 2021-01-10 NOTE — Anesthesia Procedure Notes (Signed)
Epidural Patient location during procedure: OB Start time: 01/10/2021 11:45 AM End time: 01/10/2021 11:55 AM  Staffing Anesthesiologist: Marcene Duos, MD Performed: anesthesiologist   Preanesthetic Checklist Completed: patient identified, IV checked, site marked, risks and benefits discussed, surgical consent, monitors and equipment checked, pre-op evaluation and timeout performed  Epidural Patient position: sitting Prep: DuraPrep and site prepped and draped Patient monitoring: continuous pulse ox and blood pressure Approach: midline Location: L3-L4 Injection technique: LOR air  Needle:  Needle type: Tuohy  Needle gauge: 17 G Needle length: 9 cm and 9 Needle insertion depth: 8 cm Catheter type: closed end flexible Catheter size: 19 Gauge Catheter at skin depth: 14 (13-->14) cm Test dose: negative  Assessment Events: blood not aspirated, injection not painful, no injection resistance, no paresthesia and negative IV test

## 2021-01-10 NOTE — Anesthesia Preprocedure Evaluation (Signed)
Anesthesia Evaluation  Patient identified by MRN, date of birth, ID band Patient awake    Reviewed: Allergy & Precautions, Patient's Chart, lab work & pertinent test results  Airway Mallampati: II       Dental   Pulmonary asthma ,    Pulmonary exam normal        Cardiovascular negative cardio ROS Normal cardiovascular exam     Neuro/Psych  Headaches,    GI/Hepatic negative GI ROS, Neg liver ROS,   Endo/Other  negative endocrine ROS  Renal/GU negative Renal ROS     Musculoskeletal   Abdominal   Peds  Hematology negative hematology ROS (+)   Anesthesia Other Findings   Reproductive/Obstetrics (+) Pregnancy                            Anesthesia Physical Anesthesia Plan  ASA: II  Anesthesia Plan: Epidural   Post-op Pain Management:    Induction:   PONV Risk Score and Plan: Treatment may vary due to age or medical condition  Airway Management Planned: Natural Airway  Additional Equipment:   Intra-op Plan:   Post-operative Plan:   Informed Consent: I have reviewed the patients History and Physical, chart, labs and discussed the procedure including the risks, benefits and alternatives for the proposed anesthesia with the patient or authorized representative who has indicated his/her understanding and acceptance.       Plan Discussed with:   Anesthesia Plan Comments:         Anesthesia Quick Evaluation

## 2021-01-10 NOTE — Progress Notes (Signed)
Baby will be assigned to Teaching Service and will follow up with [facility] after delivery.

## 2021-01-10 NOTE — Progress Notes (Signed)
Labor Progress Note Amy [name] is a 25 y.o. G1P0 at [redacted]w[redacted]d presented for labor  S:  Comfortable with epidural. No c/o.  O:  BP 115/68   Pulse 71   Temp 98.6 F (37 C) (Oral)   Resp 16   Wt 88.9 kg   LMP 03/24/2020 Comment: pt is [redacted] weeks pregnant  SpO2 98%   BMI 33.64 kg/m  EFM: baseline 145 bpm/ mod variability/ no accels/ no decels  Toco/IUPC: 4-5 SVE: 5/80/0  A/P: 25 y.o. G1P0 [redacted]w[redacted]d  1. Labor: active 2. FWB: Cat I 3. Pain: epidural   Minimal labor progress, recommend AROM, pt consents. AROM attempted, no fluid out. Will continue to monitor. Consider Pitocin if no change at next check. Anticipate labor progress and SVD.  Donette Larry, CNM 3:34 PM

## 2021-01-10 NOTE — Progress Notes (Signed)
Labor Progress Note Maralyn Sago Matchinis Rittenour is a 25 y.o. G1P0 at [redacted]w[redacted]d presented for labor  S:  Comfortable with epidural. Feeling pressure in lower abdomen.  O:  BP 118/67   Pulse 71   Temp 98.6 F (37 C) (Oral)   Resp 16   Wt 88.9 kg   LMP 03/24/2020 Comment: pt is [redacted] weeks pregnant  SpO2 98%   BMI 33.64 kg/m  EFM: baseline 155 bpm/ mod variability/ + accels/ rare variable decels  Toco/IUPC: 2-4 SVE: Dilation: 5 Effacement (%): 90 Station: 0 Presentation: Vertex Exam by:: Melaine Clance Baquero CNM Moderate MSF noted  A/P: 25 y.o. G1P0 [redacted]w[redacted]d  1. Labor: protracted 2. FWB: Cat II 3. Pain: epidural  Recommend Pitocin augmentation, pt agrees. IUPC placed. Anticipate labor progress and SVD.  Donette Larry, CNM 6:00 PM

## 2021-01-10 NOTE — H&P (Signed)
OBSTETRIC ADMISSION HISTORY AND PHYSICAL  Amy [name] is a 25 y.o. female G1P0 with IUP at [redacted]w[redacted]d presenting for labor. She reports +FMs. No LOF, VB, blurry vision, headaches, peripheral edema, or RUQ pain. She plans on breast and bottle feeding. She requests nothing for birth control, states she is allergic to all forms.  Dating: By Stefan Church [place] ---> Estimated Date of Delivery: 01/11/21  Sono:   @[redacted]w[redacted]d , normal anatomy, ceph presentation, 3521g, 77%ile, EFW 7'7  Prenatal History/Complications: - SUA - Rh neg - Anxiety/PTSD - Asthma - Covid positive 12/27/20  Past Medical History: Past Medical History:  Diagnosis Date  . Asthma   . Family history of adverse reaction to anesthesia   . Migraines   . Narcolepsy   . PONV (postoperative nausea and vomiting)   . Pregnancy 12/27/2019  . PTSD (post-traumatic stress disorder)   . Seasonal allergies     Past Surgical History: Past Surgical History:  Procedure Laterality Date  . CHOLECYSTECTOMY N/A 06/20/2020   Procedure: LAPAROSCOPIC CHOLECYSTECTOMY;  Surgeon: 06/22/2020, MD;  Location: [facility];  Service: General;  Laterality: N/A;  . TONSILLECTOMY    . WISDOM TOOTH EXTRACTION      Obstetrical History: OB History    Gravida  1   Para      Term      Preterm      AB      Living        SAB      IAB      Ectopic      Multiple      Live Births             Social History: Social History   Socioeconomic History  . Marital status: Single    Spouse name: Not on file  . Number of children: Not on file  . Years of education: Not on file  . Highest education level: Not on file  Occupational History  . Not on file  Tobacco Use  . Smoking status: Never Smoker  . Smokeless tobacco: Never Used  Vaping Use  . Vaping Use: Never used  Substance and Sexual Activity  . Alcohol use: Not Currently    Comment: occ  . Drug use: No  . Sexual activity: Yes    Comment: currently pregnant  Other Topics Concern  .  Not on file  Social History Narrative  . Not on file   Social Determinants of Health   Financial Resource Strain: Medium Risk  . Difficulty of Paying Living Expenses: Somewhat hard  Food Insecurity: Food Insecurity Present  . Worried About Franky Macho in the Last Year: Sometimes true  . Ran Out of Food in the Last Year: Sometimes true  Transportation Needs: No Transportation Needs  . Lack of Transportation (Medical): No  . Lack of Transportation (Non-Medical): No  Physical Activity: Insufficiently Active  . Days of Exercise per Week: 3 days  . Minutes of Exercise per Session: 30 min  Stress: No Stress Concern Present  . Feeling of Stress : Only a little  Social Connections: Moderately Isolated  . Frequency of Communication with Friends and Family: More than three times a week  . Frequency of Social Gatherings with Friends and Family: More than three times a week  . Attends Religious Services: More than 4 times per year  . Active Member of Clubs or Organizations: No  . Attends Programme researcher, broadcasting/film/video Meetings: Never  . Marital Status: Never married  Family History: Family History  Problem Relation Age of Onset  . Diabetes Mother   . Hypertension Mother   . Irritable bowel syndrome Mother   . Diabetes Father   . Cancer Father        basal cell cancer  . Cancer Maternal Uncle        brain  . Crohn's disease Maternal Aunt   . Heart disease Maternal Grandmother   . Heart disease Maternal Grandfather   . Diabetes Maternal Grandfather    Allergies: Allergies  Allergen Reactions  . Benzyl Alcohol     Burns skin  . Cough Syrup [Guaifenesin] Nausea And Vomiting  . Haemophilus Influenzae Vaccines     Hospitalized for 14 days asthma attack.  Allergic to ALL VACCINES  . Other Nausea And Vomiting    Any coated medications  . Petrolatum   . Iodine Rash    Medications Prior to Admission  Medication Sig Dispense Refill Last Dose  . albuterol (VENTOLIN HFA) 108 (90  Base) MCG/ACT inhaler Inhale 2-4 puffs by mouth every 4 hours as needed for wheezing, cough, and/or shortness of breath 6.7 g 0   . diphenhydrAMINE (BENADRYL) 25 MG tablet Take 25 mg by mouth every 6 (six) hours as needed.     . fluticasone (FLONASE) 50 MCG/ACT nasal spray Place 1 spray into both nostrils in the morning and at bedtime. 9.9 mL 2   . ipratropium-albuterol (DUONEB) 0.5-2.5 (3) MG/3ML SOLN Take 3 mLs by nebulization every 6 (six) hours. 360 mL 5   . loratadine (CLARITIN) 10 MG tablet Take 1 tablet (10 mg total) by mouth daily. 30 tablet 5   . mometasone-formoterol (DULERA) 100-5 MCG/ACT AERO INHALE 2 PUFFS BY MOUTH TWICE DAILY. RINSE MOUTH AFTER EACH USE (Patient taking differently: Inhale 2 puffs into the lungs in the morning and at bedtime. INHALE 2 PUFFS BY MOUTH TWICE DAILY. RINSE MOUTH AFTER EACH USE) 39 g 1   . montelukast (SINGULAIR) 10 MG tablet Take 1 tablet (10 mg total) by mouth at bedtime. 30 tablet 3   . promethazine (PHENERGAN) 25 MG tablet Take 0.5-1 tablets (12.5-25 mg total) by mouth every 6 (six) hours as needed for nausea or vomiting. 30 tablet 0    Review of Systems:  All systems reviewed and negative except as stated in HPI  PE: Blood pressure 136/62, pulse 86, temperature 97.7 F (36.5 C), temperature source Oral, resp. rate 16, weight 88.9 kg, last menstrual period 03/24/2020, SpO2 98 %. General appearance: alert, cooperative and no distress Lungs: regular rate and effort Heart: regular rate  Abdomen: soft, non-tender Extremities: Homans sign is negative, no sign of DVT Presentation: cephalic EFM: 155 bpm, mod variability, + accels, no decels Toco: 2-4 Dilation: 3.5 Effacement (%): 80 Station: -1 Exam by:: holly flippin rn  Prenatal labs: ABO, Rh: --/--/O NEG (01/18 1021) Antibody: POS (01/18 1021) Rubella: 5.50 (07/08 1106) RPR: Non Reactive (11/12 0739)  HBsAg: Negative (07/08 1106)  HIV: Non Reactive (11/12 0739)  GBS: Negative/-- (01/12  1400)  1 hr GTT 117  Prenatal Transfer Tool  Maternal Diabetes: No Genetic Screening: Normal Maternal Ultrasounds/Referrals: Other:single umbilical artery, enlarged CM,  Fetal Ultrasounds or other Referrals:  None Maternal Substance Abuse:  No Significant Maternal Medications:  None Significant Maternal Lab Results: None  Results for orders placed or performed during the hospital encounter of 01/10/21 (from the past 24 hour(s))  Type and screen   Collection Time: 01/10/21 10:21 AM  Result Value Ref Range  ABO/RH(D) O NEG    Antibody Screen POS    Sample Expiration      01/13/2021,2359 Performed at [facility], [address] [place], [place] [address]   CBC   Collection Time: 01/10/21 10:22 AM  Result Value Ref Range   WBC 13.2 (H) 4.0 - 10.5 K/uL   RBC 4.47 3.87 - 5.11 MIL/uL   Hemoglobin 12.9 12.0 - 15.0 g/dL   HCT 27.0 78.6 - 75.4 %   MCV 85.0 80.0 - 100.0 fL   MCH 28.9 26.0 - 34.0 pg   MCHC 33.9 30.0 - 36.0 g/dL   RDW 49.2 01.0 - 07.1 %   Platelets 137 (L) 150 - 400 K/uL   nRBC 0.0 0.0 - 0.2 %    Patient Active Problem List   Diagnosis Date Noted  . Indication for care in labor or delivery 01/10/2021  . 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 Rh negative state in antepartum period 07/01/2020  . Supervision of normal first pregnancy 06/30/2020  . Ovarian cyst 06/30/2020  . PTSD (post-traumatic stress disorder) 06/30/2020  . Anxiety 06/30/2020  . History of laparoscopic cholecystectomy 06/21/2020  . Asthma 02/25/2019  . Eczema 02/25/2019   Assessment: Amy [name] is a 25 y.o. G1P0 at [redacted]w[redacted]d here for labor  1. Labor: early 2. FWB: Cat I 3. Pain: analgesia/anesthesia prn 4. GBS: neg   Plan: Admit to LD Expectant mngt Anticipate SVD  Donette Larry, CNM  01/10/2021, 11:44 AM

## 2021-01-10 NOTE — MAU Note (Signed)
.  Amy [name] is a 25 y.o. at [redacted]w[redacted]d here in MAU reporting: ctx that started last night but intensified at 0330 this morning. No VB or LOF. Endorses good fetal movement.

## 2021-01-10 NOTE — Progress Notes (Signed)
Labor Progress Note Maralyn Sago Matchinis Rittenour is a 25 y.o. G1P0 at [redacted]w[redacted]d presented for labor. Called by RN for prolonged decel after epidural.  S:  Comfortable with epidural.   O:  BP (!) 102/50   Pulse 83   Temp 97.7 F (36.5 C) (Oral)   Resp 16   Wt 88.9 kg   LMP 03/24/2020 Comment: pt is [redacted] weeks pregnant  SpO2 98%   BMI 33.64 kg/m  EFM: baseline 150 bpm/ mod variability/ no accels/ prolonged decels  Toco/IUPC: q4-5 SVE: 4/90/0 by Dr. Barb Merino  A/P: 25 y.o. G1P0 [redacted]w[redacted]d  1. Labor: early active 2. FWB: Cat II 3. Pain: epidural  Prolonged deceleration into 90s x6 min, pt given 3 doses of Phenylephrine for BP. When I arrived Dr. Adrian Blackwater and Dr. Barb Merino were at bedside and FHR was returned to baseline. Decel likely d/t post-epidural hypotension. Will monitor closely. Anticipate labor progress and SVD.  Donette Larry, CNM 12:53 PM

## 2021-01-11 ENCOUNTER — Inpatient Hospital Stay (HOSPITAL_COMMUNITY)
Admission: AD | Admit: 2021-01-11 | Payer: Medicaid Other | Source: Home / Self Care | Admitting: Obstetrics & Gynecology

## 2021-01-11 ENCOUNTER — Inpatient Hospital Stay (HOSPITAL_COMMUNITY): Payer: Medicaid Other | Attending: Obstetrics & Gynecology

## 2021-01-11 DIAGNOSIS — Z3A39 39 weeks gestation of pregnancy: Secondary | ICD-10-CM

## 2021-01-11 LAB — RPR: RPR Ser Ql: NONREACTIVE

## 2021-01-11 MED ORDER — SENNOSIDES-DOCUSATE SODIUM 8.6-50 MG PO TABS
2.0000 | ORAL_TABLET | ORAL | Status: DC
  Administered 2021-01-11 – 2021-01-12 (×2): 2 via ORAL
  Filled 2021-01-11 (×2): qty 2

## 2021-01-11 MED ORDER — RHO D IMMUNE GLOBULIN 1500 UNIT/2ML IJ SOSY
300.0000 ug | PREFILLED_SYRINGE | Freq: Once | INTRAMUSCULAR | Status: AC
  Administered 2021-01-11: 300 ug via INTRAVENOUS
  Filled 2021-01-11: qty 2

## 2021-01-11 MED ORDER — ONDANSETRON HCL 4 MG PO TABS: 4.0000 mg | ORAL_TABLET | ORAL | Status: DC | PRN

## 2021-01-11 MED ORDER — BENZOCAINE-MENTHOL 20-0.5 % EX AERO
1.0000 "application " | INHALATION_SPRAY | CUTANEOUS | Status: DC | PRN
  Administered 2021-01-11: 1 via TOPICAL
  Filled 2021-01-11: qty 56

## 2021-01-11 MED ORDER — TETANUS-DIPHTH-ACELL PERTUSSIS 5-2.5-18.5 LF-MCG/0.5 IM SUSY: 0.5000 mL | PREFILLED_SYRINGE | Freq: Once | INTRAMUSCULAR | Status: DC

## 2021-01-11 MED ORDER — ONDANSETRON HCL 4 MG/2ML IJ SOLN: 4.0000 mg | INTRAMUSCULAR | Status: DC | PRN

## 2021-01-11 MED ORDER — DIPHENHYDRAMINE HCL 25 MG PO CAPS: 25.0000 mg | ORAL_CAPSULE | Freq: Four times a day (QID) | ORAL | Status: DC | PRN

## 2021-01-11 MED ORDER — ACETAMINOPHEN 325 MG PO TABS
650.0000 mg | ORAL_TABLET | ORAL | Status: DC | PRN
  Administered 2021-01-11 (×2): 650 mg via ORAL
  Filled 2021-01-11 (×2): qty 2

## 2021-01-11 MED ORDER — PRENATAL MULTIVITAMIN CH
1.0000 | ORAL_TABLET | Freq: Every day | ORAL | Status: DC
  Administered 2021-01-11 – 2021-01-12 (×2): 1 via ORAL
  Filled 2021-01-11 (×2): qty 1

## 2021-01-11 MED ORDER — COCONUT OIL OIL: 1.0000 "application " | TOPICAL_OIL | Status: DC | PRN

## 2021-01-11 MED ORDER — WITCH HAZEL-GLYCERIN EX PADS: 1.0000 "application " | MEDICATED_PAD | CUTANEOUS | Status: DC | PRN

## 2021-01-11 MED ORDER — ZOLPIDEM TARTRATE 5 MG PO TABS: 5.0000 mg | ORAL_TABLET | Freq: Every evening | ORAL | Status: DC | PRN

## 2021-01-11 MED ORDER — DIBUCAINE (PERIANAL) 1 % EX OINT: 1.0000 "application " | TOPICAL_OINTMENT | CUTANEOUS | Status: DC | PRN

## 2021-01-11 MED ORDER — IBUPROFEN 600 MG PO TABS
600.0000 mg | ORAL_TABLET | Freq: Four times a day (QID) | ORAL | Status: DC
  Administered 2021-01-11 – 2021-01-12 (×5): 600 mg via ORAL
  Filled 2021-01-11 (×5): qty 1

## 2021-01-11 MED ORDER — SIMETHICONE 80 MG PO CHEW: 80.0000 mg | CHEWABLE_TABLET | ORAL | Status: DC | PRN

## 2021-01-11 NOTE — Progress Notes (Addendum)
Post Partum Day 0  Subjective: Amy [name] is a 25yo G1P1001 who delivered via VAVD at 0238 this morning. She reports feeling well and has very minimal pain. She reports some "gushes" of blood that occur vaginally when she changes position and some mild vaginal discomfort. She has not been able to move around yet and has not voided post foley removal. She did state that she was going to get up and use the restroom soon when in the room. She denies headache, abdominal pain, calf tenderness, and swelling.   Objective: Blood pressure 119/73, pulse 91, temperature 98 F (36.7 C), temperature source Oral, resp. rate 18, weight 88.9 kg, last menstrual period 03/24/2020, SpO2 99 %.  Physical Exam:  General: alert and no distress  Heart: regular rate and rhythm  Lungs: clear to ausculation bilaterally, normal effort  Abdomen: hypoactive bowel sounds, nontender to palpation Lochia: appropriate Uterine Fundus: firm DVT Evaluation: No evidence of DVT seen on physical exam. No cords or calf tenderness. No significant calf/ankle edema. Pedal pulses +2 bilaterally.   Recent Labs    01/10/21 1022  HGB 12.9  HCT 38.0   Assessment/Plan: PPD#0 - Amy [name] is a 25 yo G1P1001 who delivered at [redacted]w[redacted]d via VAVD. She is doing well and reports very minimal pain/discomfort. She plans to breastfeed and is waiting for lactation consult.  Contraception - She wants to use either condoms or abstinence for contraception due to an allergy she reported for all other forms of contraception.   Rh- State in Antepartum Period  - Baby is A+ so Amy [name] will receive Rhogam.  PTSD and Anxiety - Consultation with social work.   Two Vessel Cord - Placenta sent to pathology for evaluation.   Asthma  - She reports it is well managed with current medications.  Plan to discharge home in 2 days pending no complications.    LOS: 1 day   Athena Masse 01/11/2021, 8:23 AM    GME ATTESTATION:  I saw and evaluated the patient. I agree with the findings and the plan of care as documented in the medical student's note. This note is for practice since patient is PPD#0.  Alric Seton, MD OB Fellow, Faculty [facility], Center for [facility] 01/11/2021 10:55 PM

## 2021-01-11 NOTE — Discharge Instructions (Signed)

## 2021-01-11 NOTE — Lactation Note (Signed)
Lactation Consultation Note When LC came to L&D to see mom, LC introduced self. Mom stated she was going to do formula this morning. LC asked if she plans to BF any, mom stated yes breast and formula but this morning I only want to formula feed. RN stated mom was really tired that she pushed for 3 hrs. Encouraged to call if needs LC sooner.  Patient Name: Amy [name] [id number] Date: 01/11/2021   Age:25 y.o.  Maternal Data    Feeding    LATCH Score                   Interventions    Lactation Tools Discussed/Used     Consult Status      Niharika Savino, Diamond Nickel 01/11/2021, 3:38 AM

## 2021-01-11 NOTE — Anesthesia Postprocedure Evaluation (Signed)
Anesthesia Post Note  Patient: Amy [name]  Procedure(s) Performed: AN AD HOC LABOR EPIDURAL     Patient location during evaluation: Mother Baby Anesthesia Type: Epidural Level of consciousness: awake and alert and oriented Pain management: satisfactory to patient Vital Signs Assessment: post-procedure vital signs reviewed and stable Respiratory status: respiratory function stable Cardiovascular status: stable Postop Assessment: no headache, no backache, epidural receding, patient able to bend at knees, no signs of nausea or vomiting, adequate PO intake and able to ambulate Anesthetic complications: no   No complications documented.  Last Vitals:  Vitals:   01/11/21 0530 01/11/21 0630  BP: 131/86 119/73  Pulse: 87 91  Resp: 18 18  Temp: 36.8 C 36.7 C  SpO2: 100% 99%    Last Pain:  Vitals:   01/11/21 0630  TempSrc: Oral  PainSc: 0-No pain   Pain Goal:                   Amy [name]

## 2021-01-11 NOTE — Discharge Summary (Addendum)
Postpartum Discharge Summary    Patient Name: Amy [name] DOB: 04-21-1996 MRN: [id number]  Date of admission: 01/10/2021 Delivery date:01/11/2021  Delivering provider: CONSTANT, [place]  Date of discharge: 01/12/2021  Admitting diagnosis: Post-dates pregnancy [O48.0] Intrauterine pregnancy: [redacted]w[redacted]d    Secondary diagnosis:  Active Problems:   History of laparoscopic cholecystectomy   Supervision of normal first pregnancy   Ovarian cyst   PTSD (post-traumatic stress disorder)   Anxiety   Rh negative state in antepartum period   Two vessel cord   Indication for care in labor or delivery   Vacuum-assisted vaginal delivery  Additional problems: none   Discharge diagnosis: Term Pregnancy Delivered                                              Post partum procedures:rhogam Augmentation: AROM, Pitocin, Cytotec and IP Foley Complications: None  Hospital course: Onset of Labor With Vaginal Delivery      25y.o. yo G1P0 at 456w0das admitted in Latent Labor on 01/10/2021. Due to spacing out of contractions decision was made to augment patient with AROM and pitocin. Thick meconium noted after AROM. Patient progressed to complete and began pushing. She pushed for approx 3 hours and decision was made to pursue VAVD after a fetal heart rate deceleration with return to baseline with concern for arrest of descent and maternal exhaustion. Please refer to VAVD note for further details. Patient had an uncomplicated labor course as follows:  Membrane Rupture Time/Date: 3:30 PM ,01/10/2021   Delivery Method:Vaginal, Vacuum (Extractor)  Episiotomy: None  Lacerations:  2nd degree  Patient had an uncomplicated postpartum course.  She is ambulating, tolerating a regular diet, passing flatus, and urinating well. Patient is discharged home in stable condition on 01/12/21.  Newborn Data: Birth date:01/11/2021  Birth time:2:38 AM  Gender:Female  Living status:Living  Apgars:8 ,8  Weight:3589 g    Magnesium Sulfate received: No BMZ received: No Rhophylac:Yes MMR:N/A T-DaP:declined Flu: No Transfusion:No  Physical exam  Vitals:   01/11/21 1012 01/11/21 1520 01/11/21 2139 01/12/21 0552  BP: 124/75 110/68 114/65 115/63  Pulse: 90 88 86 69  Resp: _0 Temp: 98.1 F (36.7 C) 98 F (36.7 C) 98.4 F (36.9 C) 97.8 F (36.6 C)  TempSrc: Oral Oral Oral Oral  SpO2:   100% 100%  Weight:       General: alert, cooperative and no distress Lochia: appropriate Uterine Fundus: firm Incision: N/A DVT Evaluation: No evidence of DVT seen on physical exam. Labs: Lab Results  Component Value Date   WBC 13.2 (H) 01/10/2021   HGB 12.9 01/10/2021   HCT 38.0 01/10/2021   MCV 85.0 01/10/2021   PLT 137 (L) 01/10/2021   CMP Latest Ref Rng & Units 06/30/2020  Glucose 65 - 99 mg/dL 79  BUN 6 - 20 mg/dL 8  Creatinine 0.57 - 1.00 mg/dL 0.62  Sodium 134 - 144 mmol/L 138  Potassium 3.5 - 5.2 mmol/L 4.5  Chloride 96 - 106 mmol/L 102  CO2 20 - 29 mmol/L 21  Calcium 8.7 - 10.2 mg/dL 9.6  Total Protein 6.0 - 8.5 g/dL 6.6  Total Bilirubin 0.0 - 1.2 mg/dL 0.2  Alkaline Phos 48 - 121 IU/L 135(H)  AST 0 - 40 IU/L 30  ALT 0 - 32 IU/L 69(H)   EdFlavia Shippercore: No flowsheet  data found.   After visit meds:  Allergies as of 01/12/2021      Reactions   Benzyl Alcohol    Burns skin   Cough Syrup [guaifenesin] Nausea And Vomiting   Haemophilus Influenzae Vaccines    Hospitalized for 14 days asthma attack.  Allergic to ALL VACCINES   Other Nausea And Vomiting   Any coated medications   Petrolatum    Iodine Rash      Medication List    TAKE these medications   acetaminophen 325 MG tablet Commonly known as: Tylenol Take 2 tablets (650 mg total) by mouth every 4 (four) hours as needed (for pain scale < 4).   albuterol 108 (90 Base) MCG/ACT inhaler Commonly known as: VENTOLIN HFA Inhale 2-4 puffs by mouth every 4 hours as needed for wheezing, cough, and/or shortness of breath    diphenhydrAMINE 25 MG tablet Commonly known as: BENADRYL Take 25 mg by mouth every 6 (six) hours as needed.   Dulera 100-5 MCG/ACT Aero Generic drug: mometasone-formoterol INHALE 2 PUFFS BY MOUTH TWICE DAILY. RINSE MOUTH AFTER EACH USE What changed:   how much to take  how to take this  when to take this   fluticasone 50 MCG/ACT nasal spray Commonly known as: Flonase Place 1 spray into both nostrils in the morning and at bedtime.   ibuprofen 600 MG tablet Commonly known as: ADVIL Take 1 tablet (600 mg total) by mouth every 6 (six) hours.   ipratropium-albuterol 0.5-2.5 (3) MG/3ML Soln Commonly known as: DUONEB Take 3 mLs by nebulization every 6 (six) hours.   loratadine 10 MG tablet Commonly known as: Claritin Take 1 tablet (10 mg total) by mouth daily.   montelukast 10 MG tablet Commonly known as: SINGULAIR Take 1 tablet (10 mg total) by mouth at bedtime.   prenatal multivitamin Tabs tablet Take 1 tablet by mouth daily at 12 noon.   promethazine 25 MG tablet Commonly known as: PHENERGAN Take 0.5-1 tablets (12.5-25 mg total) by mouth every 6 (six) hours as needed for nausea or vomiting.        Discharge home in stable condition Infant Feeding: Bottle and Breast Infant Disposition:home with mother Discharge instruction: per After Visit Summary and Postpartum booklet. Activity: Advance as tolerated. Pelvic rest for 6 weeks.  Diet: routine diet Future Appointments: Future Appointments  Date Time Provider [place]  02/15/2021  1:30 PM Myrtis Ser, CNM [facility]   Follow up Visit:  Follow-up Information    Myrtis Ser, CNM. Go on 02/15/2021.   Specialty: Obstetrics and Gynecology Why: at 1:30 PM Contact information: [address] [address] [phone]              Message sent to [facility] by Sylvester Harder 01/11/21.   Please schedule this patient for a In person postpartum visit in 4 weeks with the following  provider: Any provider. Additional Postpartum F/U:Postpartum Depression checkup in 1 week Low risk pregnancy complicated by: n/a Delivery mode:  Vaginal, Vacuum (Extractor)  Anticipated Birth Control:  OCPs, prescribe at pp visit   6/57/9038 Arrie Senate, MD
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
## 2021-01-12 LAB — RH IG WORKUP (INCLUDES ABO/RH)
ABO/RH(D): O NEG
Fetal Screen: NEGATIVE
Gestational Age(Wks): 40
Unit division: 0

## 2021-01-12 LAB — SURGICAL PATHOLOGY

## 2021-01-12 MED ORDER — ACETAMINOPHEN 325 MG PO TABS: 650.0000 mg | ORAL_TABLET | ORAL | 0 refills | Status: DC | PRN

## 2021-01-12 MED ORDER — IBUPROFEN 600 MG PO TABS: 600.0000 mg | ORAL_TABLET | Freq: Four times a day (QID) | ORAL | 0 refills | Status: DC

## 2021-01-12 MED ORDER — PRENATAL MULTIVITAMIN CH: 1.0000 | ORAL_TABLET | Freq: Every day | ORAL | 0 refills | Status: DC

## 2021-01-24 ENCOUNTER — Ambulatory Visit: Payer: Medicaid Other | Admitting: Women's Health

## 2021-01-24 ENCOUNTER — Ambulatory Visit (INDEPENDENT_AMBULATORY_CARE_PROVIDER_SITE_OTHER): Payer: Medicaid Other | Admitting: Obstetrics & Gynecology

## 2021-01-24 ENCOUNTER — Other Ambulatory Visit: Payer: Self-pay

## 2021-01-24 ENCOUNTER — Encounter: Payer: Self-pay | Admitting: Obstetrics & Gynecology

## 2021-01-24 VITALS — BP 125/74 | HR 87 | Ht 64.0 in | Wt 180.0 lb

## 2021-01-24 DIAGNOSIS — O8612 Endometritis following delivery: Secondary | ICD-10-CM

## 2021-01-24 MED ORDER — AMOXICILLIN-POT CLAVULANATE 875-125 MG PO TABS: 1.0000 | ORAL_TABLET | Freq: Two times a day (BID) | ORAL | 0 refills | Status: DC

## 2021-01-24 NOTE — Progress Notes (Signed)
Chief Complaint  Patient presents with  . vaginal odor      25 y.o. G1P0 Patient's last menstrual period was 03/24/2020. The current method of family planning is none.  Outpatient Encounter Medications as of 01/24/2021  Medication Sig  . albuterol (VENTOLIN HFA) 108 (90 Base) MCG/ACT inhaler Inhale 2-4 puffs by mouth every 4 hours as needed for wheezing, cough, and/or shortness of breath  . amoxicillin-clavulanate (AUGMENTIN) 875-125 MG tablet Take 1 tablet by mouth 2 (two) times daily.  [name] ipratropium-albuterol (DUONEB) 0.5-2.5 (3) MG/3ML SOLN Take 3 mLs by nebulization every 6 (six) hours.  [name] loratadine (CLARITIN) 10 MG tablet Take 1 tablet (10 mg total) by mouth daily.  . mometasone-formoterol (DULERA) 100-5 MCG/ACT AERO INHALE 2 PUFFS BY MOUTH TWICE DAILY. RINSE MOUTH AFTER EACH USE (Patient taking differently: Inhale 2 puffs into the lungs in the morning and at bedtime. INHALE 2 PUFFS BY MOUTH TWICE DAILY. RINSE MOUTH AFTER EACH USE)  . montelukast (SINGULAIR) 10 MG tablet Take 1 tablet (10 mg total) by mouth at bedtime.  [name] acetaminophen (TYLENOL) 325 MG tablet Take 2 tablets (650 mg total) by mouth every 4 (four) hours as needed (for pain scale < 4). (Patient not taking: Reported on 01/24/2021)  . diphenhydrAMINE (BENADRYL) 25 MG tablet Take 25 mg by mouth every 6 (six) hours as needed. (Patient not taking: Reported on 01/24/2021)  . fluticasone (FLONASE) 50 MCG/ACT nasal spray Place 1 spray into both nostrils in the morning and at bedtime. (Patient not taking: Reported on 01/24/2021)  . ibuprofen (ADVIL) 600 MG tablet Take 1 tablet (600 mg total) by mouth every 6 (six) hours. (Patient not taking: Reported on 01/24/2021)  . Prenatal Vit-Fe Fumarate-FA (PRENATAL MULTIVITAMIN) TABS tablet Take 1 tablet by mouth daily at 12 noon. (Patient not taking: Reported on 01/24/2021)  . promethazine (PHENERGAN) 25 MG tablet Take 0.5-1 tablets (12.5-25 mg total) by mouth every 6 (six) hours as needed for  nausea or vomiting. (Patient not taking: Reported on 01/24/2021)   No facility-administered encounter medications on file as of 01/24/2021.    Subjective Pt delivered 2 weeks ago Started noticing a "horrible" odor from the vagina 2 days ago Bleeding not really an issue Also with ongoing abdominal pain No sex since delivery No fevers chills or urinary sx Past Medical History:  Diagnosis Date  . Asthma   . Family history of adverse reaction to anesthesia   . Migraines   . Narcolepsy   . PONV (postoperative nausea and vomiting)   . Pregnancy 12/27/2019  . PTSD (post-traumatic stress disorder)   . Seasonal allergies     Past Surgical History:  Procedure Laterality Date  . CHOLECYSTECTOMY N/A 06/20/2020   Procedure: LAPAROSCOPIC CHOLECYSTECTOMY;  Surgeon: Franky Macho, MD;  Location: [facility];  Service: General;  Laterality: N/A;  . TONSILLECTOMY    . WISDOM TOOTH EXTRACTION      OB History    Gravida  1   Para      Term      Preterm      AB      Living        SAB      IAB      Ectopic      Multiple      Live Births              Allergies  Allergen Reactions  . Benzyl Alcohol     Burns skin  . Cough Syrup [  Guaifenesin] Nausea And Vomiting  . Haemophilus Influenzae Vaccines     Hospitalized for 14 days asthma attack.  Allergic to ALL VACCINES  . Other Nausea And Vomiting    Any coated medications  . Petrolatum   . Iodine Rash    Social History   Socioeconomic History  . Marital status: Single    Spouse name: Not on file  . Number of children: Not on file  . Years of education: Not on file  . Highest education level: Not on file  Occupational History  . Not on file  Tobacco Use  . Smoking status: Never Smoker  . Smokeless tobacco: Never Used  Vaping Use  . Vaping Use: Never used  Substance and Sexual Activity  . Alcohol use: Not Currently    Comment: occ  . Drug use: No  . Sexual activity: Yes    Comment: currently pregnant  Other  Topics Concern  . Not on file  Social History Narrative  . Not on file   Social Determinants of Health   Financial Resource Strain: Medium Risk  . Difficulty of Paying Living Expenses: Somewhat hard  Food Insecurity: Food Insecurity Present  . Worried About Programme researcher, broadcasting/film/video in the Last Year: Sometimes true  . Ran Out of Food in the Last Year: Sometimes true  Transportation Needs: No Transportation Needs  . Lack of Transportation (Medical): No  . Lack of Transportation (Non-Medical): No  Physical Activity: Insufficiently Active  . Days of Exercise per Week: 3 days  . Minutes of Exercise per Session: 30 min  Stress: No Stress Concern Present  . Feeling of Stress : Only a little  Social Connections: Moderately Isolated  . Frequency of Communication with Friends and Family: More than three times a week  . Frequency of Social Gatherings with Friends and Family: More than three times a week  . Attends Religious Services: More than 4 times per year  . Active Member of Clubs or Organizations: No  . Attends Banker Meetings: Never  . Marital Status: Never married    Family History  Problem Relation Age of Onset  . Diabetes Mother   . Hypertension Mother   . Irritable bowel syndrome Mother   . Diabetes Father   . Cancer Father        basal cell cancer  . Cancer Maternal Uncle        brain  . Crohn's disease Maternal Aunt   . Heart disease Maternal Grandmother   . Heart disease Maternal Grandfather   . Diabetes Maternal Grandfather     Medications:       Current Outpatient Medications:  .  albuterol (VENTOLIN HFA) 108 (90 Base) MCG/ACT inhaler, Inhale 2-4 puffs by mouth every 4 hours as needed for wheezing, cough, and/or shortness of breath, Disp: 6.7 g, Rfl: 0 .  amoxicillin-clavulanate (AUGMENTIN) 875-125 MG tablet, Take 1 tablet by mouth 2 (two) times daily., Disp: 20 tablet, Rfl: 0 .  ipratropium-albuterol (DUONEB) 0.5-2.5 (3) MG/3ML SOLN, Take 3 mLs by  nebulization every 6 (six) hours., Disp: 360 mL, Rfl: 5 .  loratadine (CLARITIN) 10 MG tablet, Take 1 tablet (10 mg total) by mouth daily., Disp: 30 tablet, Rfl: 5 .  mometasone-formoterol (DULERA) 100-5 MCG/ACT AERO, INHALE 2 PUFFS BY MOUTH TWICE DAILY. RINSE MOUTH AFTER EACH USE (Patient taking differently: Inhale 2 puffs into the lungs in the morning and at bedtime. INHALE 2 PUFFS BY MOUTH TWICE DAILY. RINSE MOUTH AFTER EACH USE), Disp:  39 g, Rfl: 1 .  montelukast (SINGULAIR) 10 MG tablet, Take 1 tablet (10 mg total) by mouth at bedtime., Disp: 30 tablet, Rfl: 3 .  acetaminophen (TYLENOL) 325 MG tablet, Take 2 tablets (650 mg total) by mouth every 4 (four) hours as needed (for pain scale < 4). (Patient not taking: Reported on 01/24/2021), Disp: 30 tablet, Rfl: 0 .  diphenhydrAMINE (BENADRYL) 25 MG tablet, Take 25 mg by mouth every 6 (six) hours as needed. (Patient not taking: Reported on 01/24/2021), Disp: , Rfl:  .  fluticasone (FLONASE) 50 MCG/ACT nasal spray, Place 1 spray into both nostrils in the morning and at bedtime. (Patient not taking: Reported on 01/24/2021), Disp: 9.9 mL, Rfl: 2 .  ibuprofen (ADVIL) 600 MG tablet, Take 1 tablet (600 mg total) by mouth every 6 (six) hours. (Patient not taking: Reported on 01/24/2021), Disp: 30 tablet, Rfl: 0 .  Prenatal Vit-Fe Fumarate-FA (PRENATAL MULTIVITAMIN) TABS tablet, Take 1 tablet by mouth daily at 12 noon. (Patient not taking: Reported on 01/24/2021), Disp: 30 tablet, Rfl: 0 .  promethazine (PHENERGAN) 25 MG tablet, Take 0.5-1 tablets (12.5-25 mg total) by mouth every 6 (six) hours as needed for nausea or vomiting. (Patient not taking: Reported on 01/24/2021), Disp: 30 tablet, Rfl: 0  Objective Blood pressure 125/74, pulse 87, height 5\' 4"  (1.626 m), weight 180 lb (81.6 kg), last menstrual period 03/24/2020, currently breastfeeding.  General WDWN female NAD Vulva:  normal appearing vulva with no masses, tenderness or lesions Vagina:  normal mucosa,  greenish discharge, no foreign body or sponge is noted visually or on bimanual exm Cervix:  Normal no lesions tender Uterus:  subinvoluted and tender Adnexa: ovaries:present,  normal adnexa in size, nontender and no masses  Bottom is healing well   Pertinent ROS No burning with urination, frequency or urgency No nausea, vomiting or diarrhea Nor fever chills or other constitutional symptoms   Labs or studies No new    Impression Diagnoses this Encounter::   ICD-10-CM   1. Postpartum endometritis, NSVD 01/11/21  O86.12    No retained sponge or other foreign body noted    Established relevant diagnosis(es):   Plan/Recommendations: Meds ordered this encounter  Medications  . amoxicillin-clavulanate (AUGMENTIN) 875-125 MG tablet    Sig: Take 1 tablet by mouth 2 (two) times daily.    Dispense:  20 tablet    Refill:  0    Labs or Scans Ordered: No orders of the defined types were placed in this encounter.   Management:: augmentin x 10 days  Follow up No follow-ups on file.    All questions were answered.

## 2021-02-15 ENCOUNTER — Telehealth (INDEPENDENT_AMBULATORY_CARE_PROVIDER_SITE_OTHER): Payer: Medicaid Other | Admitting: Advanced Practice Midwife

## 2021-02-15 ENCOUNTER — Other Ambulatory Visit: Payer: Self-pay

## 2021-02-15 DIAGNOSIS — N393 Stress incontinence (female) (male): Secondary | ICD-10-CM

## 2021-02-15 DIAGNOSIS — O99893 Other specified diseases and conditions complicating puerperium: Secondary | ICD-10-CM | POA: Diagnosis not present

## 2021-02-15 DIAGNOSIS — Z8759 Personal history of other complications of pregnancy, childbirth and the puerperium: Secondary | ICD-10-CM | POA: Diagnosis not present

## 2021-02-15 HISTORY — DX: Stress incontinence (female) (male): N39.3

## 2021-02-15 NOTE — Progress Notes (Signed)
TELEHEALTH VIRTUAL POSTPARTUM VISIT ENCOUNTER NOTE Patient name: Amy [name] MRN [id number]  Date of birth: 10/27/1996  I connected with patient on 02/15/21 at  1:30 PM EST by telephone (unable to connect to [place] with her wifi) and verified that I am speaking with the correct person using two identifiers. Pt is not currently in our office, she is at home.  The provider is in the office.    I discussed the limitations, risks, security and privacy concerns of performing an evaluation and management service by telephone and the availability of in person appointments. I also discussed with the patient that there may be a patient responsible charge related to this service. The patient expressed understanding and agreed to proceed.  Chief Complaint:   Postpartum Care  History of Present Illness:   Amy [name] is a 25 y.o. G75P0 Caucasian female being seen today for a postpartum visit. She is 5 weeks postpartum following a vacuum, low at 40.0 gestational weeks for mat exhaustion/arrest of descent. IOL: No Anesthesia: epidural.  Laceration: 2nd degree perineal.  Complications: none. Inpatient contraception: no.   Pregnancy complicated by Rh neg; 2 vessel cord. Tobacco use: no. Substance use disorder: no. Last pap smear: Aug 2021 and results were NILM w/ HRHPV not done. Next pap smear due: Aug 2024 Patient's last menstrual period was 02/08/2021 (approximate).  Postpartum course has been complicated by PP endometritis tx with Augmentin x 10d. Bleeding staining only. Bowel function is normal. Bladder function is normal. Urinary incontinence? Yes (emptying her bladder every time she coughs/sneezes) fecal incontinence? No Patient is not sexually active. Last sexual activity: prior to delivery.  Desired contraception: Condoms. Patient does want a pregnancy in the future.  Desired family size is unsure # of children.   Upstream - 02/15/21 1346      Pregnancy Intention Screening    Does the patient want to become pregnant in the next year? No    Does the patient's partner want to become pregnant in the next year? No    Would the patient like to discuss contraceptive options today? No      Contraception Wrap Up   Current Method Abstinence    End Method Female Condom    Contraception Counseling Provided No          The pregnancy intention screening data noted above was reviewed. Potential methods of contraception were discussed. The patient elected to proceed with Female Condom as she is not planning to be sexually active.   Edinburgh Postpartum Depression Screening: Negative  Edinburgh Postnatal Depression Scale - 02/15/21 1347      Edinburgh Postnatal Depression Scale:  In the Past 7 Days   I have been able to laugh and see the funny side of things. 0    I have looked forward with enjoyment to things. 0    I have blamed myself unnecessarily when things went wrong. 0    I have been anxious or worried for no good reason. 0    I have felt scared or panicky for no good reason. 0    Things have been getting on top of me. 0    I have been so unhappy that I have had difficulty sleeping. 0    I have felt sad or miserable. 0    I have been so unhappy that I have been crying. 0    The thought of harming myself has occurred to me. 0    Edinburgh Postnatal Depression Scale Total  0          Baby's course has been uncomplicated. Baby is feeding by breast and bottle: milk supply adequate (has been pumping and dumping since taking Augmentin; encouraged her that it is out of her system now and she can give the baby her milk). Infant has a pediatrician/family doctor? Yes.  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  Pt has material needs met for her and baby: Yes.   Review of Systems:   Pertinent items are noted in HPI Denies Abnormal vaginal discharge w/ itching/odor/irritation, headaches, visual changes, shortness of breath, chest pain,  abdominal pain, severe nausea/vomiting, or problems with urination or bowel movements. Pertinent History Reviewed:  Reviewed past medical,surgical, obstetrical and family history.  Reviewed problem list, medications and allergies. OB History  Gravida Para Term Preterm AB Living  1            SAB IAB Ectopic Multiple Live Births               # Outcome Date GA Lbr Len/2nd Weight Sex Delivery Anes PTL Lv  1 Gravida            Physical Assessment:  There were no vitals filed for this visit.There is no height or weight on file to calculate BMI.         Physical Examination:   General:  Alert, oriented and cooperative.   Mental Status: Normal mood and affect perceived.   Rest of physical exam deferred due to type of encounter       No results found for this or any previous visit (from the past 24 hour(s)).  Assessment & Plan:  1) Postpartum exam 2) Five wks s/p vacuum, low 3) breast & bottle feeding 4) Depression screening- negative 5) Contraception counseling- condoms for now (allergic to plastic; would plan OCPs if needed) 6) Bladder stress incontinence- refer to Earlie Counts PT 7) s/p PP endometritis- doing well now  Essential components of care per ACOG recommendations:  1.  Mood and well being:  . If positive depression screen, discussed and plan developed.  . If using tobacco we discussed reduction/cessation and risk of relapse . If current substance abuse, we discussed and referral to local resources was offered.   2. Infant care and feeding:  . If breastfeeding, discussed returning to work, pumping, breastfeeding-associated pain, guidance regarding return to fertility while lactating if not using another method. If needed, patient was provided with a letter to be allowed to pump q 2-3hrs to support lactation in a private location with access to a refrigerator to store breastmilk.   . Recommended that all caregivers be immunized for flu, pertussis and other preventable  communicable diseases . If pt does not have material needs met for her/baby, referred to local resources for help obtaining these.  3. Sexuality, contraception and birth spacing . Provided guidance regarding sexuality, management of dyspareunia, and resumption of intercourse . Discussed avoiding interpregnancy interval <61mths and recommended birth spacing of 18 months  4. Sleep and fatigue . Discussed coping options for fatigue and sleep disruption . Encouraged family/partner/community support of 4 hrs of uninterrupted sleep to help with mood and fatigue  5. Physical recovery  . If pt had a C/S, assessed incisional pain and providing guidance on normal vs prolonged recovery . If pt had a laceration, perineal healing and pain reviewed.  . If urinary or fecal incontinence, discussed management and referred to PT or uro/gyn if indicated  . Patient  is safe to resume physical activity. Discussed attainment of healthy weight.  6.  Chronic disease management . Discussed pregnancy complications if any, and their implications for future childbearing and long-term maternal health. . Review recommendations for prevention of recurrent pregnancy complications, such as 17 hydroxyprogesterone caproate to reduce risk for recurrent PTB not applicable, or aspirin to reduce risk of preeclampsia not applicable. . Pt had GDM: No. If yes, 2hr GTT scheduled: not applicable. . Reviewed medications and non-pregnant dosing including consideration of whether pt is breastfeeding using a reliable resource such as LactMed: yes . Referred for f/u w/ PCP or subspecialist providers as indicated: not applicable  7. Health maintenance . Mammogram at 25yo or earlier if indicated . Pap smears as indicated  Meds: No orders of the defined types were placed in this encounter.   Follow-up: Return in about 1 year (around 02/15/2022) for Physical.   No orders of the defined types were placed in this encounter.    I  provided 10 minutes of non-face-to-face time during this encounter.  Myrtis Ser CNM 02/15/2021 2:42 PM

## 2021-03-22 IMAGING — US US ABDOMEN LIMITED
1 series · 14 of 25 positions shown · non-contrast
Comparison: CT abdomen/pelvis 01/04/2019.

CLINICAL DATA: Right upper quadrant abdominal pain, vomiting.
Additional history provided by scanning technologist: Right upper
quadrant pain with nausea/vomiting since this morning, 10 weeks IUP

EXAM:
ULTRASOUND ABDOMEN LIMITED RIGHT UPPER QUADRANT

[Series 1: us abdomen limited · 14 of 45 slices shown]
[im 1/45]
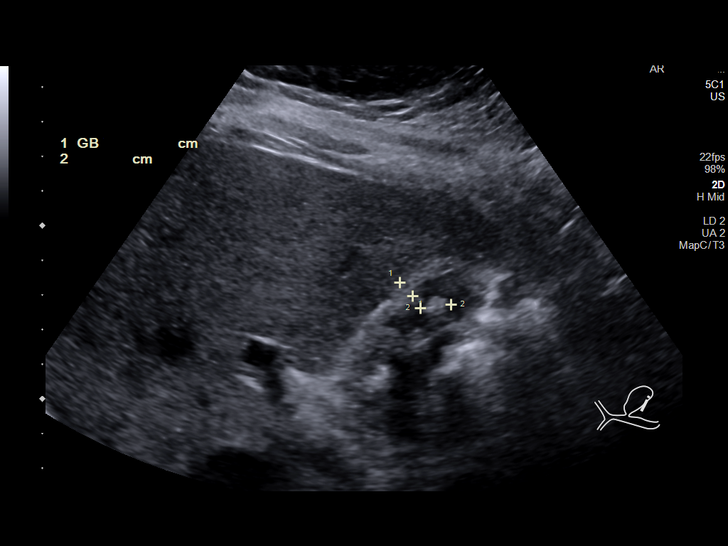
[im 4/45]
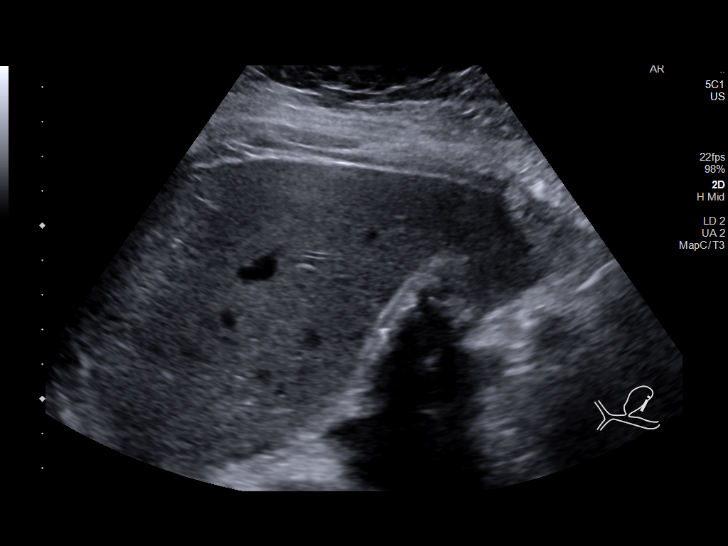
[im 8/45]
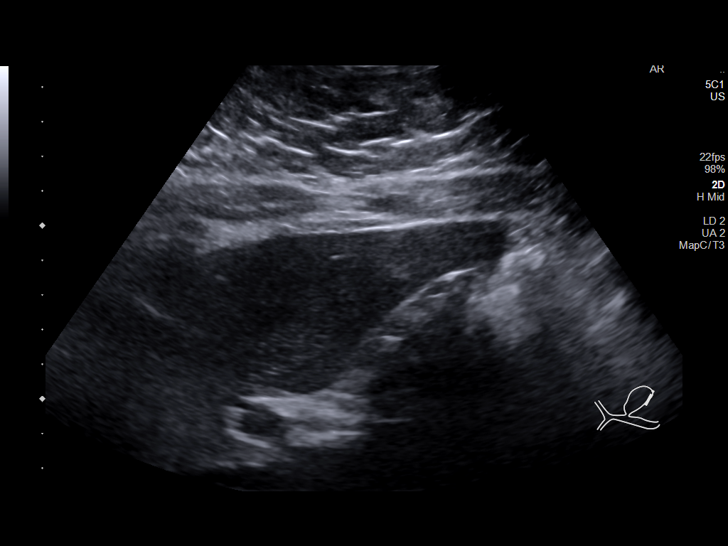
[im 12/45]
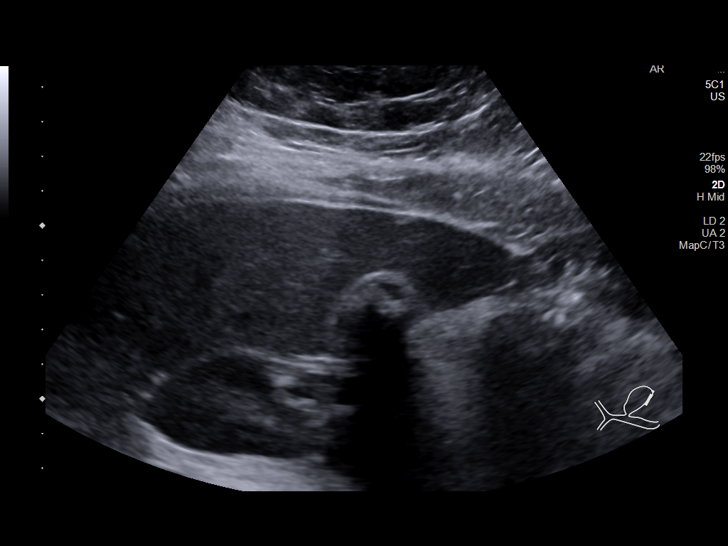
[im 15/45]
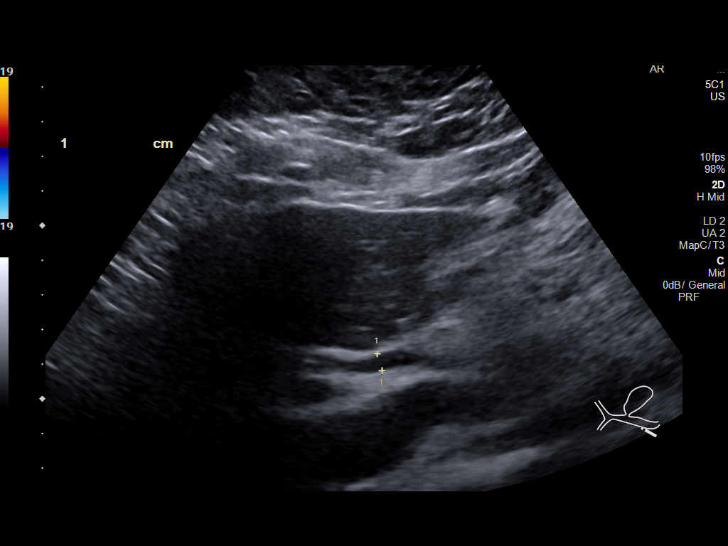
[im 17/45]
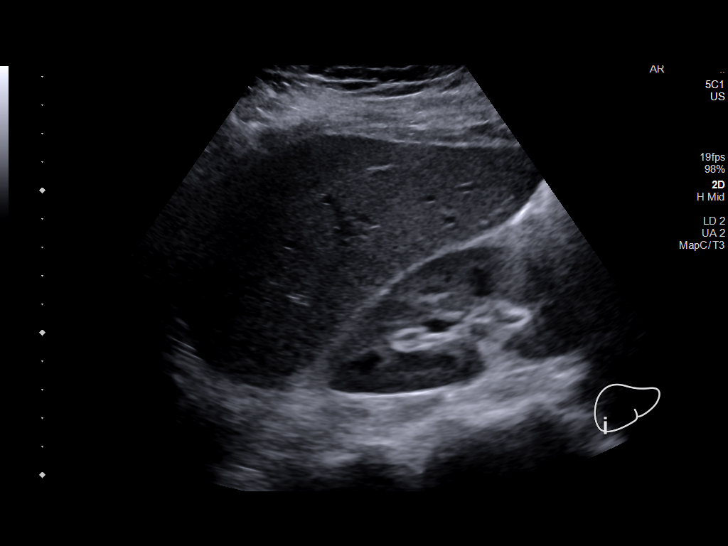
[im 21/45]
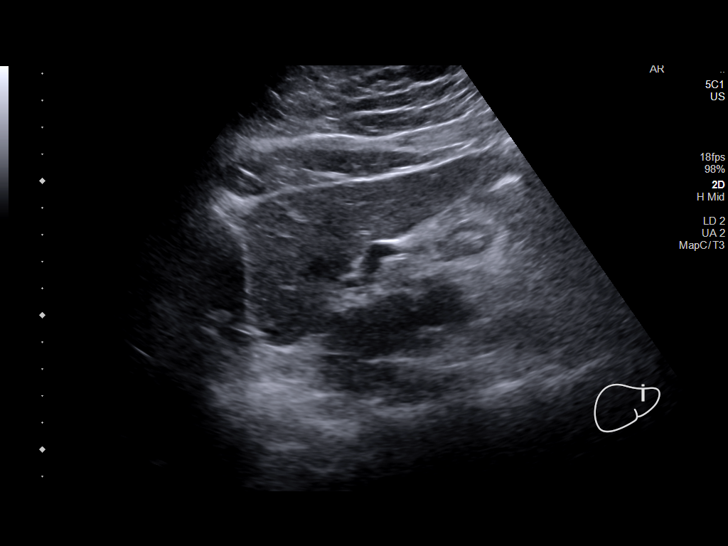
[im 24/45]
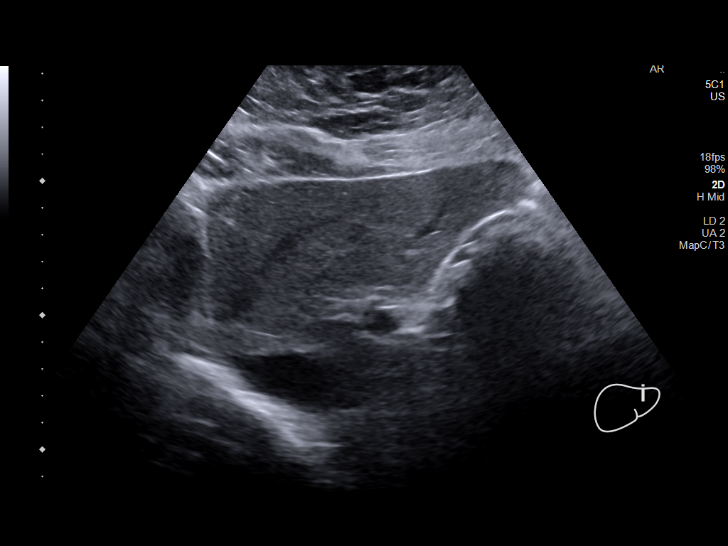
[im 28/45]
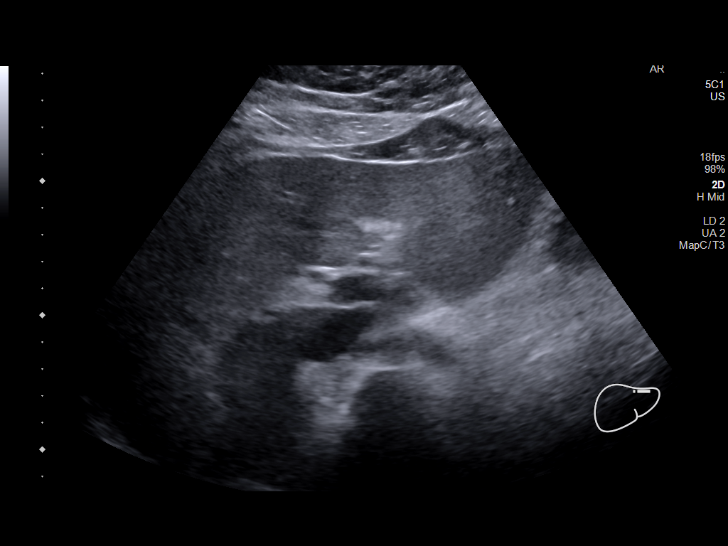
[im 30/45]
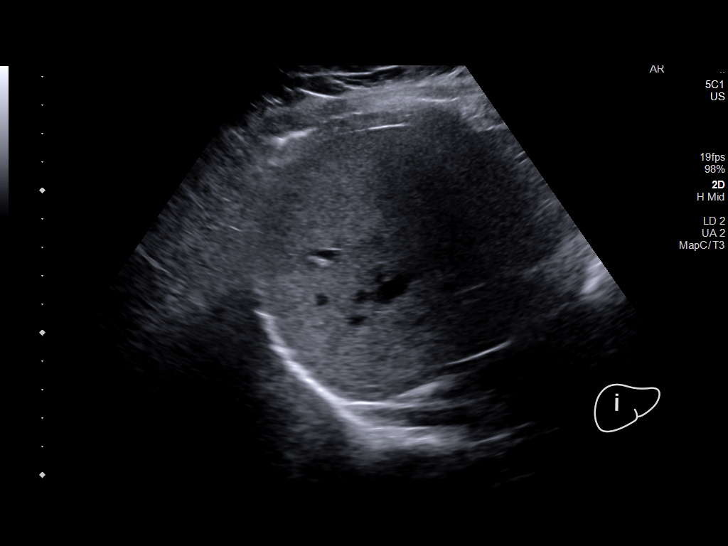
[im 34/45]
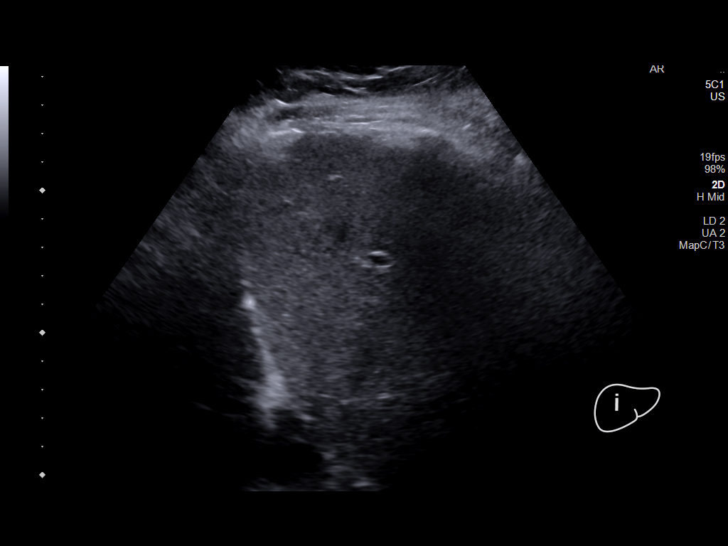
[im 37/45]
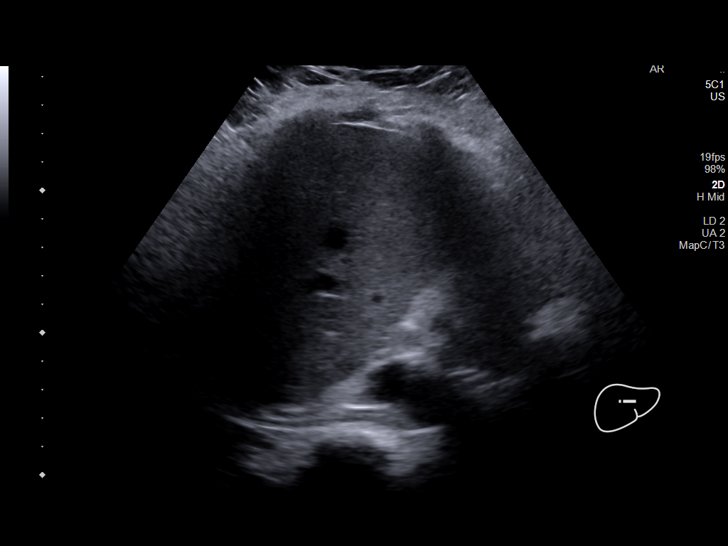
[im 41/45]
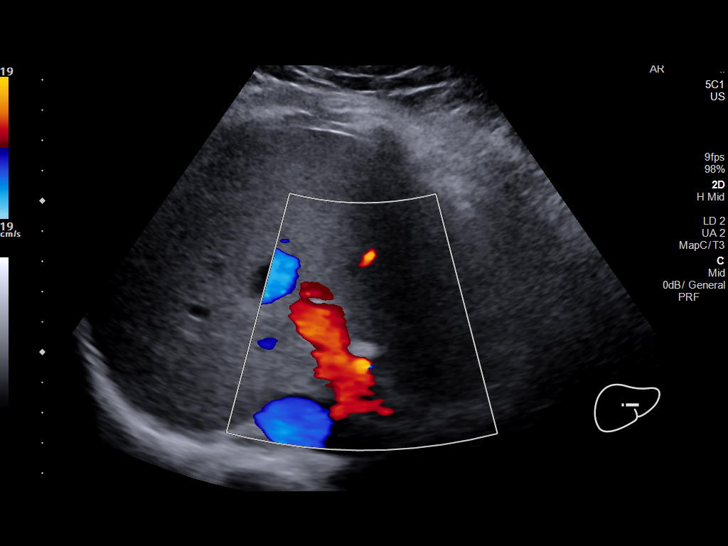
[im 45/45]
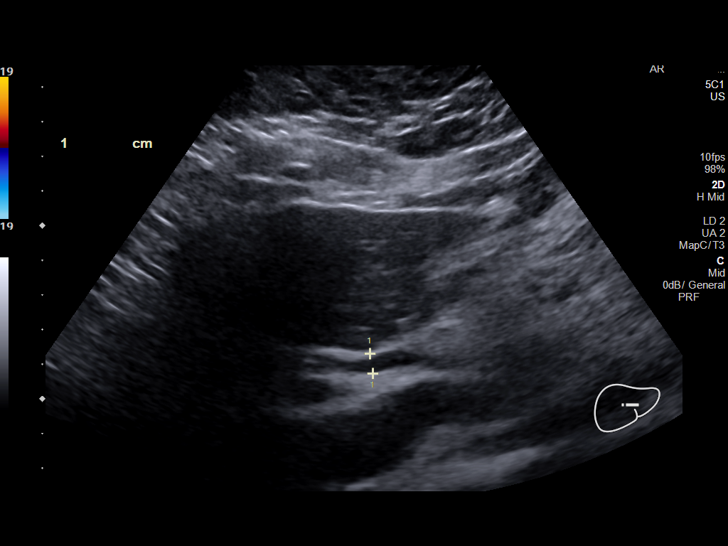

[14 of 25 positions shown; findings below may reference images not displayed]

FINDINGS: Gallbladder:

There is cholecystolithiasis. The visualized gallbladder wall is
thickened to 5 mm. No definite pericholecystic fluid is appreciated.
A sonographic Murphy sign is elicited by the scanning technologist.

Common bile duct:

Diameter: 5 mm, within normal limits.

Liver:

No focal lesion identified. Within normal limits in parenchymal
echogenicity. Portal vein is patent on color Doppler imaging with
normal direction of blood flow towards the liver.
IMPRESSION: Cholecystolithiasis with gallbladder wall thickening to 5 mm.
Additionally, a sonographic Murphy sign is elicited by the scanning
technologist. This constellation of findings is suspicious for acute
cholecystitis and clinical correlation is recommended.

## 2021-03-27 ENCOUNTER — Ambulatory Visit: Payer: Medicaid Other | Admitting: Physical Therapy

## 2021-05-18 ENCOUNTER — Ambulatory Visit: Payer: Self-pay | Admitting: Physical Therapy

## 2021-05-31 ENCOUNTER — Other Ambulatory Visit: Payer: Self-pay

## 2021-05-31 ENCOUNTER — Ambulatory Visit
Admission: EM | Admit: 2021-05-31 | Discharge: 2021-05-31 | Disposition: A | Discharge status: Home / Self Care | Payer: Medicaid Other | Attending: Family Medicine | Admitting: Family Medicine

## 2021-05-31 ENCOUNTER — Encounter: Payer: Self-pay | Admitting: Emergency Medicine

## 2021-05-31 DIAGNOSIS — B349 Viral infection, unspecified: Secondary | ICD-10-CM | POA: Insufficient documentation

## 2021-05-31 DIAGNOSIS — J029 Acute pharyngitis, unspecified: Secondary | ICD-10-CM | POA: Diagnosis present

## 2021-05-31 LAB — POCT RAPID STREP A (OFFICE): Rapid Strep A Screen: NEGATIVE

## 2021-05-31 MED ORDER — PREDNISONE 10 MG (21) PO TBPK: ORAL_TABLET | Freq: Every day | ORAL | 0 refills | Status: AC

## 2021-05-31 NOTE — Discharge Instructions (Addendum)
I have sent in a prednisone taper for you to take for 6 days. 6 tablets on day one, 5 tablets on day two, 4 tablets on day three, 3 tablets on day four, 2 tablets on day five, and 1 tablet on day six.  Your rapid strep test is negative. A throat culture is pending; we will call you if it is positive requiring treatment.    Your COVID and Influenza tests are pending.  You should self quarantine until the test results are back.    Take Tylenol or ibuprofen as needed for fever or discomfort.  Rest and keep yourself hydrated.    Follow-up with your primary care provider if your symptoms are not improving.

## 2021-05-31 NOTE — ED Triage Notes (Signed)
Sore throat with productive cough - brown sputum x 2 days with fever.

## 2021-05-31 NOTE — ED Provider Notes (Signed)
[facility]   [id number] 05/31/21 Arrival Time: 1925   CC: COVID symptoms  SUBJECTIVE: History from: patient.  Amy [name] is a 25 y.o. female who presents with nasal congestion and cough, SOB for the last 2 days. Also reports fever at home. Denies sick exposure to COVID, flu or strep. Denies recent travel. Has positive history of Covid. Has not completed Covid vaccines. Has not completed flu vaccine. Has not taken OTC medications for this. Reports cough and SOB are worse with activity. Reports previous symptoms in the past. Denies chills, sinus pain, rhinorrhea, sore throat, chest pain, nausea, changes in bowel or bladder habits.    ROS: As per HPI.  All other pertinent ROS negative.     Past Medical History:  Diagnosis Date   Asthma    Family history of adverse reaction to anesthesia    Migraines    Narcolepsy    PONV (postoperative nausea and vomiting)    Pregnancy 12/27/2019   PTSD (post-traumatic stress disorder)    Seasonal allergies    Past Surgical History:  Procedure Laterality Date   CHOLECYSTECTOMY N/A 06/20/2020   Procedure: LAPAROSCOPIC CHOLECYSTECTOMY;  Surgeon: Franky Macho, MD;  Location: [facility];  Service: General;  Laterality: N/A;   TONSILLECTOMY     WISDOM TOOTH EXTRACTION     Allergies  Allergen Reactions   Benzyl Alcohol     Burns skin   Cough Syrup [Guaifenesin] Nausea And Vomiting   Haemophilus Influenzae Vaccines     Hospitalized for 14 days asthma attack.  Allergic to ALL VACCINES   Other Nausea And Vomiting    Any coated medications   Petrolatum    Iodine Rash   No current facility-administered medications on file prior to encounter.   Current Outpatient Medications on File Prior to Encounter  Medication Sig Dispense Refill   acetaminophen (TYLENOL) 325 MG tablet Take 2 tablets (650 mg total) by mouth every 4 (four) hours as needed (for pain scale < 4). 30 tablet 0   albuterol (VENTOLIN HFA) 108 (90 Base) MCG/ACT  inhaler Inhale 2-4 puffs by mouth every 4 hours as needed for wheezing, cough, and/or shortness of breath 6.7 g 0   diphenhydrAMINE (BENADRYL) 25 MG tablet Take 25 mg by mouth every 6 (six) hours as needed.     fluticasone (FLONASE) 50 MCG/ACT nasal spray Place 1 spray into both nostrils in the morning and at bedtime. 9.9 mL 2   ibuprofen (ADVIL) 600 MG tablet Take 1 tablet (600 mg total) by mouth every 6 (six) hours. 30 tablet 0   ipratropium-albuterol (DUONEB) 0.5-2.5 (3) MG/3ML SOLN Take 3 mLs by nebulization every 6 (six) hours. 360 mL 5   loratadine (CLARITIN) 10 MG tablet Take 1 tablet (10 mg total) by mouth daily. 30 tablet 5   mometasone-formoterol (DULERA) 100-5 MCG/ACT AERO INHALE 2 PUFFS BY MOUTH TWICE DAILY. RINSE MOUTH AFTER EACH USE (Patient taking differently: Inhale 2 puffs into the lungs in the morning and at bedtime. INHALE 2 PUFFS BY MOUTH TWICE DAILY. RINSE MOUTH AFTER EACH USE) 39 g 1   montelukast (SINGULAIR) 10 MG tablet Take 1 tablet (10 mg total) by mouth at bedtime. 30 tablet 3   Social History   Socioeconomic History   Marital status: Single    Spouse name: Not on file   Number of children: Not on file   Years of education: Not on file   Highest education level: Not on file  Occupational History   Not on  file  Tobacco Use   Smoking status: Never   Smokeless tobacco: Never  Vaping Use   Vaping Use: Never used  Substance and Sexual Activity   Alcohol use: Not Currently    Comment: occ   Drug use: No   Sexual activity: Yes    Comment: currently pregnant  Other Topics Concern   Not on file  Social History Narrative   Not on file   Social Determinants of Health   Financial Resource Strain: Medium Risk   Difficulty of Paying Living Expenses: Somewhat hard  Food Insecurity: Food Insecurity Present   Worried About Running Out of Food in the Last Year: Sometimes true   Ran Out of Food in the Last Year: Sometimes true  Transportation Needs: No Transportation  Needs   Lack of Transportation (Medical): No   Lack of Transportation (Non-Medical): No  Physical Activity: Insufficiently Active   Days of Exercise per Week: 3 days   Minutes of Exercise per Session: 30 min  Stress: No Stress Concern Present   Feeling of Stress : Only a little  Social Connections: Moderately Isolated   Frequency of Communication with Friends and Family: More than three times a week   Frequency of Social Gatherings with Friends and Family: More than three times a week   Attends Religious Services: More than 4 times per year   Active Member of [employer] or Organizations: No   Attends Engineer, structural: Never   Marital Status: Never married  Catering manager Violence: Not At Risk   Fear of Current or Ex-Partner: No   Emotionally Abused: No   Physically Abused: No   Sexually Abused: No   Family History  Problem Relation Age of Onset   Diabetes Mother    Hypertension Mother    Irritable bowel syndrome Mother    Diabetes Father    Cancer Father        basal cell cancer   Cancer Maternal Uncle        brain   Crohn's disease Maternal Aunt    Heart disease Maternal Grandmother    Heart disease Maternal Grandfather    Diabetes Maternal Grandfather     OBJECTIVE:  Vitals:   05/31/21 1933  BP: 128/77  Pulse: (!) 117  Resp: 18  Temp: 98.5 F (36.9 C)  TempSrc: Oral  SpO2: 95%     General appearance: alert; appears fatigued, but nontoxic; speaking in full sentences and tolerating own secretions HEENT: NCAT; Ears: EACs clear, TMs pearly gray; Eyes: PERRL.  EOM grossly intact. Sinuses: nontender; Nose: nares patent with clear rhinorrhea, Throat: oropharynx erythematous, cobblestoning present, tonsils non erythematous or enlarged, uvula midline  Neck: supple without LAD Lungs: unlabored respirations, symmetrical air entry; cough: moderate; no respiratory distress; diffuse wheezing bilaterally Heart: regular rate and rhythm.  Radial pulses 2+ symmetrical  bilaterally Skin: warm and dry Psychological: alert and cooperative; normal mood and affect  LABS:  Results for orders placed or performed during the hospital encounter of 05/31/21 (from the past 24 hour(s))  POCT rapid strep A     Status: None   Collection Time: 05/31/21  7:38 PM  Result Value Ref Range   Rapid Strep A Screen Negative Negative  Covid-19, Flu A+B ([facility])     Status: None   Collection Time: 05/31/21  7:42 PM   Specimen: Nasopharyngeal   Naso  Result Value Ref Range   SARS-CoV-2, NAA Not Detected Not Detected   Influenza A, NAA Not Detected Not Detected  Influenza B, NAA Not Detected Not Detected   Narrative   Test(s) 140142-Influenza A, NAA; 140143-Influenza B, NAA was developed and its performance characteristics determined by [facility]. It has not been cleared or approved by the Food and Drug Administration. Performed at:  [address] [place], [place]  [id number] Lab Director: Jolene Schimke MD, Phone:  [phone]     ASSESSMENT & PLAN:  1. Viral illness   2. Sore throat     Meds ordered this encounter  Medications   predniSONE (STERAPRED UNI-PAK 21 TAB) 10 MG (21) TBPK tablet    Sig: Take by mouth daily for 6 days. Take 6 tablets on day 1, 5 tablets on day 2, 4 tablets on day 3, 3 tablets on day 4, 2 tablets on day 5, 1 tablet on day 6    Dispense:  21 tablet    Refill:  0    Order Specific Question:   Supervising Provider    Answer:   Merrilee Jansky [[id number]]    Declines steroid injection in office today Steroid taper prescribed Continue supportive care at home COVID and flu testing ordered.  It will take between 2-3 days for test results. Someone will contact you regarding abnormal results.   Work  note provided Patient should remain in quarantine until they have received Covid results.  If negative you may resume normal activities (go back to work/school) while practicing hand hygiene, social distance, and mask  wearing.  If positive, patient should remain in quarantine for at least 5 days from symptom onset AND greater than 72 hours after symptoms resolution (absence of fever without the use of fever-reducing medication and improvement in respiratory symptoms), whichever is longer Get plenty of rest and push fluids Use OTC zyrtec for nasal congestion, runny nose, and/or sore throat Use OTC flonase for nasal congestion and runny nose Use medications daily for symptom relief Use OTC medications like ibuprofen or tylenol as needed fever or pain Call or go to the ED if you have any new or worsening symptoms such as fever, worsening cough, shortness of breath, chest tightness, chest pain, turning blue, changes in mental status.  Reviewed expectations re: course of current medical issues. Questions answered. Outlined signs and symptoms indicating need for more acute intervention. Patient verbalized understanding. After Visit Summary given.         Moshe Cipro, NP 06/01/21 1851

## 2021-06-01 LAB — COVID-19, FLU A+B NAA
Influenza A, NAA: NOT DETECTED
Influenza B, NAA: NOT DETECTED
SARS-CoV-2, NAA: NOT DETECTED

## 2021-06-03 LAB — CULTURE, GROUP A STREP (THRC)

## 2021-07-20 ENCOUNTER — Ambulatory Visit (HOSPITAL_COMMUNITY)
Admission: RE | Admit: 2021-07-20 | Discharge: 2021-07-20 | Disposition: A | Discharge status: Home / Self Care | Payer: Medicaid Other | Source: Ambulatory Visit | Attending: Gerontology | Admitting: Gerontology

## 2021-07-20 ENCOUNTER — Other Ambulatory Visit (HOSPITAL_COMMUNITY): Payer: Self-pay | Admitting: Gerontology

## 2021-07-20 ENCOUNTER — Other Ambulatory Visit: Payer: Self-pay

## 2021-07-20 DIAGNOSIS — R059 Cough, unspecified: Secondary | ICD-10-CM | POA: Diagnosis present

## 2021-07-20 DIAGNOSIS — M542 Cervicalgia: Secondary | ICD-10-CM | POA: Insufficient documentation

## 2021-10-08 ENCOUNTER — Emergency Department (HOSPITAL_COMMUNITY): Payer: Medicaid Other

## 2021-10-08 ENCOUNTER — Other Ambulatory Visit: Payer: Self-pay

## 2021-10-08 ENCOUNTER — Emergency Department (HOSPITAL_COMMUNITY)
Admission: EM | Admit: 2021-10-08 | Discharge: 2021-10-08 | Disposition: A | Discharge status: Home / Self Care | Payer: Medicaid Other | Attending: Emergency Medicine | Admitting: Emergency Medicine

## 2021-10-08 ENCOUNTER — Encounter (HOSPITAL_COMMUNITY): Payer: Self-pay | Admitting: Emergency Medicine

## 2021-10-08 DIAGNOSIS — W108XXA Fall (on) (from) other stairs and steps, initial encounter: Secondary | ICD-10-CM | POA: Insufficient documentation

## 2021-10-08 DIAGNOSIS — J45909 Unspecified asthma, uncomplicated: Secondary | ICD-10-CM | POA: Insufficient documentation

## 2021-10-08 DIAGNOSIS — M549 Dorsalgia, unspecified: Secondary | ICD-10-CM | POA: Diagnosis not present

## 2021-10-08 DIAGNOSIS — Z7951 Long term (current) use of inhaled steroids: Secondary | ICD-10-CM | POA: Diagnosis not present

## 2021-10-08 DIAGNOSIS — S8002XA Contusion of left knee, initial encounter: Secondary | ICD-10-CM | POA: Diagnosis not present

## 2021-10-08 DIAGNOSIS — S8992XA Unspecified injury of left lower leg, initial encounter: Secondary | ICD-10-CM | POA: Diagnosis present

## 2021-10-08 NOTE — Discharge Instructions (Addendum)
Return if any problems.  See the Orthopaedist for recheck in 1 week if symptoms persist

## 2021-10-08 NOTE — ED Triage Notes (Signed)
Pt from home via [facility]. Pt reports falling down stairs. Pt reports left knee and right lower back pain.

## 2021-10-09 NOTE — ED Provider Notes (Signed)
[facility] EMERGENCY DEPARTMENT Provider Note   CSN: [id number] Arrival date & time: 10/08/21  8502     History Chief Complaint  Patient presents with   Amy [name] is a 25 y.o. female.  Pt reports she fell down stairs.  Pt complains of knee pain and back soreness.  Pt landed on her knee  The history is provided by the patient. No language interpreter was used.  Fall This is a new problem. The problem occurs constantly. Nothing aggravates the symptoms. Nothing relieves the symptoms. She has tried nothing for the symptoms.      Past Medical History:  Diagnosis Date   Asthma    Family history of adverse reaction to anesthesia    Migraines    Narcolepsy    PONV (postoperative nausea and vomiting)    Pregnancy 12/27/2019   PTSD (post-traumatic stress disorder)    Seasonal allergies     Patient Active Problem List   Diagnosis Date Noted   Stress incontinence 02/15/2021   Ovarian cyst 06/30/2020   PTSD (post-traumatic stress disorder) 06/30/2020   Anxiety 06/30/2020   History of laparoscopic cholecystectomy 06/21/2020   Asthma 02/25/2019   Eczema 02/25/2019    Past Surgical History:  Procedure Laterality Date   CHOLECYSTECTOMY N/A 06/20/2020   Procedure: LAPAROSCOPIC CHOLECYSTECTOMY;  Surgeon: Franky Macho, MD;  Location: [facility];  Service: General;  Laterality: N/A;   TONSILLECTOMY     WISDOM TOOTH EXTRACTION       OB History     Gravida  1   Para      Term      Preterm      AB      Living         SAB      IAB      Ectopic      Multiple      Live Births              Family History  Problem Relation Age of Onset   Diabetes Mother    Hypertension Mother    Irritable bowel syndrome Mother    Diabetes Father    Cancer Father        basal cell cancer   Cancer Maternal Uncle        brain   Crohn's disease Maternal Aunt    Heart disease Maternal Grandmother    Heart disease Maternal Grandfather    Diabetes  Maternal Grandfather     Social History   Tobacco Use   Smoking status: Never   Smokeless tobacco: Never  Vaping Use   Vaping Use: Never used  Substance Use Topics   Alcohol use: Not Currently    Comment: occ   Drug use: No    Home Medications Prior to Admission medications   Medication Sig Start Date End Date Taking? Authorizing Provider  acetaminophen (TYLENOL) 325 MG tablet Take 2 tablets (650 mg total) by mouth every 4 (four) hours as needed (for pain scale < 4). 01/12/21   Shirlean Mylar, MD  albuterol (VENTOLIN HFA) 108 (90 Base) MCG/ACT inhaler Inhale 2-4 puffs by mouth every 4 hours as needed for wheezing, cough, and/or shortness of breath 04/14/20   Shahmehdi, Gemma Payor, MD  diphenhydrAMINE (BENADRYL) 25 MG tablet Take 25 mg by mouth every 6 (six) hours as needed.    [provider]  fluticasone (FLONASE) 50 MCG/ACT nasal spray Place 1 spray into both nostrils in the morning and at  bedtime. 12/31/20 12/31/21  Levie Heritage, DO  ibuprofen (ADVIL) 600 MG tablet Take 1 tablet (600 mg total) by mouth every 6 (six) hours. 01/12/21   Shirlean Mylar, MD  ipratropium-albuterol (DUONEB) 0.5-2.5 (3) MG/3ML SOLN Take 3 mLs by nebulization every 6 (six) hours. 04/14/20   Shahmehdi, Gemma Payor, MD  loratadine (CLARITIN) 10 MG tablet Take 1 tablet (10 mg total) by mouth daily. 04/14/20 04/14/21  Shahmehdi, Gemma Payor, MD  mometasone-formoterol (DULERA) 100-5 MCG/ACT AERO INHALE 2 PUFFS BY MOUTH TWICE DAILY. RINSE MOUTH AFTER EACH USE Patient taking differently: Inhale 2 puffs into the lungs in the morning and at bedtime. INHALE 2 PUFFS BY MOUTH TWICE DAILY. RINSE MOUTH AFTER EACH USE 04/14/20   Shahmehdi, Gemma Payor, MD  montelukast (SINGULAIR) 10 MG tablet Take 1 tablet (10 mg total) by mouth at bedtime. 04/14/20   Kendell Bane, MD    Allergies    Benzyl alcohol, Cough syrup [guaifenesin], Haemophilus influenzae vaccines, Other, Petrolatum, and Iodine  Review of Systems   Review of  Systems  All other systems reviewed and are negative.  Physical Exam Updated Vital Signs BP 123/76 (BP Location: Left Arm)   Pulse 79   Temp 97.9 F (36.6 C) (Oral)   Resp 18   SpO2 98%   Physical Exam Vitals reviewed.  Cardiovascular:     Rate and Rhythm: Normal rate.  Pulmonary:     Effort: Pulmonary effort is normal.  Musculoskeletal:        General: Swelling and tenderness present.     Comments: Tender left knee, pain with movement nv and nv intact  Skin:    General: Skin is warm.  Neurological:     General: No focal deficit present.     Mental Status: She is alert.  Psychiatric:        Mood and Affect: Mood normal.    ED Results / Procedures / Treatments   Labs (all labs ordered are listed, but only abnormal results are displayed) Labs Reviewed - No data to display  EKG None  Radiology DG Knee Complete 4 Views Left  Result Date: 10/08/2021 CLINICAL DATA:  Pt states she fell down her steps while holding her baby and is c/o left knee pain. EXAM: LEFT KNEE - COMPLETE 4+ VIEW COMPARISON:  Left knee radiographs 03/02/2018 FINDINGS: No evidence of fracture, dislocation, or joint effusion. No evidence of arthropathy or other focal bone abnormality. Soft tissues are unremarkable. IMPRESSION: Negative. Electronically Signed   By: Emmaline Kluver M.D.   On: 10/08/2021 10:16    Procedures Procedures   Medications Ordered in ED Medications - No data to display  ED Course  I have reviewed the triage vital signs and the nursing notes.  Pertinent labs & imaging results that were available during my care of the patient were reviewed by me and considered in my medical decision making (see chart for details).    MDM Rules/Calculators/A&P                           MDM:  no fracture Pt placed in a knee immbolizer  Final Clinical Impression(s) / ED Diagnoses Final diagnoses:  Contusion of left knee, initial encounter    Rx / DC Orders ED Discharge Orders      None     An After Visit Summary was printed and given to the patient.    Elson Areas, PA-C 10/09/21 1210    Zackowski,  Lorin Picket, MD 10/13/21 [phone]

## 2021-11-19 ENCOUNTER — Other Ambulatory Visit: Payer: Self-pay

## 2021-11-19 ENCOUNTER — Ambulatory Visit
Admission: EM | Admit: 2021-11-19 | Discharge: 2021-11-19 | Disposition: A | Discharge status: Home / Self Care | Payer: Self-pay | Attending: Family Medicine | Admitting: Family Medicine

## 2021-11-19 DIAGNOSIS — J069 Acute upper respiratory infection, unspecified: Secondary | ICD-10-CM

## 2021-11-19 MED ORDER — CETIRIZINE HCL 10 MG PO TABS: 10.0000 mg | ORAL_TABLET | Freq: Every day | ORAL | 0 refills | Status: DC

## 2021-11-19 NOTE — ED Triage Notes (Signed)
Patient states for the past 4 days she has felt like she has had cotton in her right ear and she is getting gobs of wax out of it.  She states she does have a sore throat.   She states her son currently has an ear infections and she may have contracted something from him.   Denies Fever.   Denies meds.   Does not want to be tested for the Covid/Flu.

## 2021-11-23 ENCOUNTER — Other Ambulatory Visit: Payer: Self-pay

## 2021-11-23 ENCOUNTER — Ambulatory Visit
Admission: EM | Admit: 2021-11-23 | Discharge: 2021-11-23 | Disposition: A | Discharge status: Home / Self Care | Payer: Self-pay | Attending: Urgent Care | Admitting: Urgent Care

## 2021-11-23 DIAGNOSIS — K122 Cellulitis and abscess of mouth: Secondary | ICD-10-CM

## 2021-11-23 DIAGNOSIS — R519 Headache, unspecified: Secondary | ICD-10-CM

## 2021-11-23 DIAGNOSIS — K1379 Other lesions of oral mucosa: Secondary | ICD-10-CM

## 2021-11-23 MED ORDER — NAPROXEN 500 MG PO TABS: 500.0000 mg | ORAL_TABLET | Freq: Two times a day (BID) | ORAL | 0 refills | Status: DC

## 2021-11-23 MED ORDER — AMOXICILLIN-POT CLAVULANATE 875-125 MG PO TABS: 1.0000 | ORAL_TABLET | Freq: Two times a day (BID) | ORAL | 0 refills | Status: DC

## 2021-11-23 NOTE — ED Provider Notes (Signed)
Amy [name]    CSN: [id number] Arrival date & time: 11/19/21  1302      History   Chief Complaint No chief complaint on file.   HPI Amy [name] is a 25 y.o. female.   HPI Patient presents today with 4 days of right ear fullness.  She was able to extract wax from her ear.  Concerned that she may have an ear infection.  She also complains of sore throat.  She has not had any fever.  No known contact with anyone positive for COVID or flu.  Past Medical History:  Diagnosis Date   Asthma    Family history of adverse reaction to anesthesia    Migraines    Narcolepsy    PONV (postoperative nausea and vomiting)    Pregnancy 12/27/2019   PTSD (post-traumatic stress disorder)    Seasonal allergies     Patient Active Problem List   Diagnosis Date Noted   Stress incontinence 02/15/2021   Ovarian cyst 06/30/2020   PTSD (post-traumatic stress disorder) 06/30/2020   Anxiety 06/30/2020   History of laparoscopic cholecystectomy 06/21/2020   Asthma 02/25/2019   Eczema 02/25/2019    Past Surgical History:  Procedure Laterality Date   CHOLECYSTECTOMY N/A 06/20/2020   Procedure: LAPAROSCOPIC CHOLECYSTECTOMY;  Surgeon: Aviva Signs, MD;  Location: [facility];  Service: General;  Laterality: N/A;   TONSILLECTOMY     WISDOM TOOTH EXTRACTION      OB History     Gravida  1   Para      Term      Preterm      AB      Living         SAB      IAB      Ectopic      Multiple      Live Births               Home Medications    Prior to Admission medications   Medication Sig Start Date End Date Taking? Authorizing Provider  cetirizine (ZYRTEC) 10 MG tablet Take 1 tablet (10 mg total) by mouth daily. 11/19/21  Yes Scot Jun, FNP  acetaminophen (TYLENOL) 325 MG tablet Take 2 tablets (650 mg total) by mouth every 4 (four) hours as needed (for pain scale < 4). 01/12/21   Gladys Damme, MD  albuterol (VENTOLIN HFA) 108 (90 Base)  MCG/ACT inhaler Inhale 2-4 puffs by mouth every 4 hours as needed for wheezing, cough, and/or shortness of breath 04/14/20   Shahmehdi, Valeria Batman, MD  diphenhydrAMINE (BENADRYL) 25 MG tablet Take 25 mg by mouth every 6 (six) hours as needed.    [provider]  fluticasone (FLONASE) 50 MCG/ACT nasal spray Place 1 spray into both nostrils in the morning and at bedtime. 12/31/20 12/31/21  Truett Mainland, DO  ibuprofen (ADVIL) 600 MG tablet Take 1 tablet (600 mg total) by mouth every 6 (six) hours. 01/12/21   Gladys Damme, MD  ipratropium-albuterol (DUONEB) 0.5-2.5 (3) MG/3ML SOLN Take 3 mLs by nebulization every 6 (six) hours. 04/14/20   Shahmehdi, Valeria Batman, MD  loratadine (CLARITIN) 10 MG tablet Take 1 tablet (10 mg total) by mouth daily. 04/14/20 04/14/21  Shahmehdi, Valeria Batman, MD  mometasone-formoterol (DULERA) 100-5 MCG/ACT AERO INHALE 2 PUFFS BY MOUTH TWICE DAILY. RINSE MOUTH AFTER EACH USE Patient taking differently: Inhale 2 puffs into the lungs in the morning and at bedtime. INHALE 2 PUFFS BY MOUTH TWICE DAILY. RINSE  MOUTH AFTER EACH USE 04/14/20   Shahmehdi, Valeria Batman, MD  montelukast (SINGULAIR) 10 MG tablet Take 1 tablet (10 mg total) by mouth at bedtime. 04/14/20   Deatra James, MD    Family History Family History  Problem Relation Age of Onset   Diabetes Mother    Hypertension Mother    Irritable bowel syndrome Mother    Diabetes Father    Cancer Father        basal cell cancer   Cancer Maternal Uncle        brain   Crohn's disease Maternal Aunt    Heart disease Maternal Grandmother    Heart disease Maternal Grandfather    Diabetes Maternal Grandfather     Social History Social History   Tobacco Use   Smoking status: Never   Smokeless tobacco: Never  Vaping Use   Vaping Use: Never used  Substance Use Topics   Alcohol use: Not Currently    Comment: occ   Drug use: No     Allergies   Benzyl alcohol, Cough syrup [guaifenesin], Haemophilus influenzae vaccines,  Other, Petrolatum, and Iodine   Review of Systems Review of Systems Pertinent negatives listed in HPI  Physical Exam Triage Vital Signs ED Triage Vitals  Enc Vitals Group     BP 11/19/21 1659 125/80     Pulse Rate 11/19/21 1659 89     Resp 11/19/21 1659 16     Temp 11/19/21 1659 97.6 F (36.4 C)     Temp Source 11/19/21 1659 Tympanic     SpO2 11/19/21 1659 96 %     Weight --      Height --      Head Circumference --      Peak Flow --      Pain Score 11/19/21 1655 3     Pain Loc --      Pain Edu? --      Excl. in [place]? --    No data found.  Updated Vital Signs BP 125/80 (BP Location: Right Arm)   Pulse 89   Temp 97.6 F (36.4 C) (Tympanic)   Resp 16   LMP 10/16/2021   SpO2 96%   Visual Acuity Right Eye Distance:   Left Eye Distance:   Bilateral Distance:    Right Eye Near:   Left Eye Near:    Bilateral Near:     Physical Exam General Appearance:    Alert, cooperative, no distress  HENT:   Normocephalic,  bilateral TM normal without fluid or infection, and nasal mucosa congested, oropharynx w/o erythema or exudate   Eyes:    PERRL, conjunctiva/corneas clear, EOM's intact       Lungs:     Clear to auscultation bilaterally, respirations unlabored  Heart:    Regular rate and rhythm  Neurologic:   Awake, alert, oriented x 3. No apparent focal neurological           defect.         UC Treatments / Results  Labs (all labs ordered are listed, but only abnormal results are displayed) Labs Reviewed - No data to display  EKG   Radiology No results found.  Procedures Procedures (including critical care time)  Medications Ordered in UC Medications - No data to display  Initial Impression / Assessment and Plan / UC Course  I have reviewed the triage vital signs and the nursing notes.  Pertinent labs & imaging results that were available during my care of the  patient were reviewed by me and considered in my medical decision making (see chart for  details).   Suspect ear fullness is related to nasal congestion recommend cetirizine 10 mg once daily as needed for congestion.  No evidence of any bacterial infection.  Hydrate well with fluids.  Return as needed. Final Clinical Impressions(s) / UC Diagnoses   Final diagnoses:  Viral URI   Discharge Instructions   None    ED Prescriptions     Medication Sig Dispense Auth. Provider   cetirizine (ZYRTEC) 10 MG tablet Take 1 tablet (10 mg total) by mouth daily. 30 tablet Bing Neighbors, FNP      PDMP not reviewed this encounter.   Bing Neighbors, FNP 11/23/21 1158

## 2021-11-23 NOTE — ED Triage Notes (Signed)
Pt presents with c/o abscess on right side of tongue that popped last night , still pain full

## 2021-11-23 NOTE — ED Provider Notes (Signed)
[place]   MRN: [id number] DOB: 04-10-1996  Subjective:   Amy [name] is a 25 y.o. female presenting for 2-day history of acute onset persistent and worsening right lower sided posterior oral pain at the base of her tongue extending to the gum.  States that she saw a bump that she expressed and had pus come out of.  She has had significant difficulty speaking, trouble swallowing and controlling secretions.  She does have a dental specialist that she plans on making an appointment with soon.  Has also had some facial pain.  However, this is improved when she popped her lesion.  No current facility-administered medications for this encounter.  Current Outpatient Medications:    acetaminophen (TYLENOL) 325 MG tablet, Take 2 tablets (650 mg total) by mouth every 4 (four) hours as needed (for pain scale < 4)., Disp: 30 tablet, Rfl: 0   albuterol (VENTOLIN HFA) 108 (90 Base) MCG/ACT inhaler, Inhale 2-4 puffs by mouth every 4 hours as needed for wheezing, cough, and/or shortness of breath, Disp: 6.7 g, Rfl: 0   cetirizine (ZYRTEC) 10 MG tablet, Take 1 tablet (10 mg total) by mouth daily., Disp: 30 tablet, Rfl: 0   diphenhydrAMINE (BENADRYL) 25 MG tablet, Take 25 mg by mouth every 6 (six) hours as needed., Disp: , Rfl:    fluticasone (FLONASE) 50 MCG/ACT nasal spray, Place 1 spray into both nostrils in the morning and at bedtime., Disp: 9.9 mL, Rfl: 2   ibuprofen (ADVIL) 600 MG tablet, Take 1 tablet (600 mg total) by mouth every 6 (six) hours., Disp: 30 tablet, Rfl: 0   ipratropium-albuterol (DUONEB) 0.5-2.5 (3) MG/3ML SOLN, Take 3 mLs by nebulization every 6 (six) hours., Disp: 360 mL, Rfl: 5   loratadine (CLARITIN) 10 MG tablet, Take 1 tablet (10 mg total) by mouth daily., Disp: 30 tablet, Rfl: 5   mometasone-formoterol (DULERA) 100-5 MCG/ACT AERO, INHALE 2 PUFFS BY MOUTH TWICE DAILY. RINSE MOUTH AFTER EACH USE (Patient taking differently: Inhale 2 puffs into the  lungs in the morning and at bedtime. INHALE 2 PUFFS BY MOUTH TWICE DAILY. RINSE MOUTH AFTER EACH USE), Disp: 39 g, Rfl: 1   montelukast (SINGULAIR) 10 MG tablet, Take 1 tablet (10 mg total) by mouth at bedtime., Disp: 30 tablet, Rfl: 3   Allergies  Allergen Reactions   Benzyl Alcohol     Burns skin   Cough Syrup [Guaifenesin] Nausea And Vomiting   Haemophilus Influenzae Vaccines     Hospitalized for 14 days asthma attack.  Allergic to ALL VACCINES   Other Nausea And Vomiting    Any coated medications   Petrolatum    Iodine Rash    Past Medical History:  Diagnosis Date   Asthma    Family history of adverse reaction to anesthesia    Migraines    Narcolepsy    PONV (postoperative nausea and vomiting)    Pregnancy 12/27/2019   PTSD (post-traumatic stress disorder)    Seasonal allergies      Past Surgical History:  Procedure Laterality Date   CHOLECYSTECTOMY N/A 06/20/2020   Procedure: LAPAROSCOPIC CHOLECYSTECTOMY;  Surgeon: Aviva Signs, MD;  Location: [facility];  Service: General;  Laterality: N/A;   TONSILLECTOMY     WISDOM TOOTH EXTRACTION      Family History  Problem Relation Age of Onset   Diabetes Mother    Hypertension Mother    Irritable bowel syndrome Mother    Diabetes Father    Cancer Father  basal cell cancer   Cancer Maternal Uncle        brain   Crohn's disease Maternal Aunt    Heart disease Maternal Grandmother    Heart disease Maternal Grandfather    Diabetes Maternal Grandfather     Social History   Tobacco Use   Smoking status: Never   Smokeless tobacco: Never  Vaping Use   Vaping Use: Never used  Substance Use Topics   Alcohol use: Not Currently    Comment: occ   Drug use: No    ROS   Objective:   Vitals: BP 126/81   Pulse 85   Temp 97.6 F (36.4 C)   Resp 20   SpO2 96%   Physical Exam Constitutional:      General: She is not in acute distress.    Appearance: Normal appearance. She is well-developed. She is not  ill-appearing.  HENT:     Head: Normocephalic and atraumatic.     Nose: Nose normal.     Mouth/Throat:     Mouth: Mucous membranes are moist.     Pharynx: Oropharynx is clear.   Eyes:     General: No scleral icterus.    Extraocular Movements: Extraocular movements intact.     Pupils: Pupils are equal, round, and reactive to light.  Cardiovascular:     Rate and Rhythm: Normal rate.  Pulmonary:     Effort: Pulmonary effort is normal.  Skin:    General: Skin is warm and dry.  Neurological:     General: No focal deficit present.     Mental Status: She is alert and oriented to person, place, and time.  Psychiatric:        Mood and Affect: Mood normal.        Behavior: Behavior normal.    Assessment and Plan :   PDMP not reviewed this encounter.  1. Oral infection   2. Oral pain   3. Facial pain    Patient is hemodynamically stable.  Recommended starting Augmentin for an oral infection, underlying abscess.  Low suspicion for Ludwig's angina.  Naproxen for pain and inflammation.  Emphasized need for follow-up with her oral and dental specialist.  Counseled patient on potential for adverse effects with medications prescribed/recommended today, ER and return-to-clinic precautions discussed, patient verbalized understanding.    Wallis Bamberg, PA-C 11/23/21 1215

## 2021-11-23 NOTE — Discharge Instructions (Addendum)
We will use Augmentin for the oral infection and naproxen for pain and inflammation. Please make sure you follow up with your oral specialist, dentist.

## 2022-02-28 ENCOUNTER — Encounter: Payer: Self-pay | Admitting: Emergency Medicine

## 2022-02-28 ENCOUNTER — Ambulatory Visit
Admission: EM | Admit: 2022-02-28 | Discharge: 2022-02-28 | Disposition: A | Discharge status: Home / Self Care | Payer: Medicaid Other | Attending: Urgent Care | Admitting: Urgent Care

## 2022-02-28 ENCOUNTER — Other Ambulatory Visit: Payer: Self-pay

## 2022-02-28 DIAGNOSIS — J4540 Moderate persistent asthma, uncomplicated: Secondary | ICD-10-CM | POA: Insufficient documentation

## 2022-02-28 DIAGNOSIS — B349 Viral infection, unspecified: Secondary | ICD-10-CM | POA: Insufficient documentation

## 2022-02-28 DIAGNOSIS — R0981 Nasal congestion: Secondary | ICD-10-CM | POA: Insufficient documentation

## 2022-02-28 DIAGNOSIS — J309 Allergic rhinitis, unspecified: Secondary | ICD-10-CM | POA: Insufficient documentation

## 2022-02-28 DIAGNOSIS — R070 Pain in throat: Secondary | ICD-10-CM | POA: Insufficient documentation

## 2022-02-28 LAB — POCT RAPID STREP A (OFFICE): Rapid Strep A Screen: NEGATIVE

## 2022-02-28 MED ORDER — PREDNISONE 50 MG PO TABS: 50.0000 mg | ORAL_TABLET | Freq: Every day | ORAL | 0 refills | Status: DC

## 2022-02-28 MED ORDER — ALBUTEROL SULFATE HFA 108 (90 BASE) MCG/ACT IN AERS: 1.0000 | INHALATION_SPRAY | Freq: Four times a day (QID) | RESPIRATORY_TRACT | 0 refills | Status: DC | PRN

## 2022-02-28 MED ORDER — LEVOCETIRIZINE DIHYDROCHLORIDE 5 MG PO TABS: 5.0000 mg | ORAL_TABLET | Freq: Every evening | ORAL | 0 refills | Status: DC

## 2022-02-28 MED ORDER — BENZONATATE 100 MG PO CAPS: 100.0000 mg | ORAL_CAPSULE | Freq: Three times a day (TID) | ORAL | 0 refills | Status: DC | PRN

## 2022-02-28 NOTE — ED Triage Notes (Signed)
Pt reports woke up this am with sore throat and cough. Pt reports history of asthma and reports intermittent wheeze. Pt denies any fevers, body aches, other symptoms.  ?

## 2022-02-28 NOTE — ED Provider Notes (Signed)
?[place] ? ? ?MRN: [id number] DOB: 1996-06-19 ? ?Subjective:  ? ?Amy [name] is a 26 y.o. female presenting for acute onset since this morning of throat pain, hoarseness, coughing, chest tightness, wheezing.  Patient has a history of asthma and is on her daily breathing treatments but does not have an albuterol inhaler.  No chest pain, shortness of breath, fevers, body aches.  Does not want to be tested for COVID.  She is not a smoker. ? ?No current facility-administered medications for this encounter. ? ?Current Outpatient Medications:  ?  acetaminophen (TYLENOL) 325 MG tablet, Take 2 tablets (650 mg total) by mouth every 4 (four) hours as needed (for pain scale < 4)., Disp: 30 tablet, Rfl: 0 ?  albuterol (VENTOLIN HFA) 108 (90 Base) MCG/ACT inhaler, Inhale 2-4 puffs by mouth every 4 hours as needed for wheezing, cough, and/or shortness of breath, Disp: 6.7 g, Rfl: 0 ?  amoxicillin-clavulanate (AUGMENTIN) 875-125 MG tablet, Take 1 tablet by mouth 2 (two) times daily., Disp: 14 tablet, Rfl: 0 ?  cetirizine (ZYRTEC) 10 MG tablet, Take 1 tablet (10 mg total) by mouth daily., Disp: 30 tablet, Rfl: 0 ?  diphenhydrAMINE (BENADRYL) 25 MG tablet, Take 25 mg by mouth every 6 (six) hours as needed., Disp: , Rfl:  ?  fluticasone (FLONASE) 50 MCG/ACT nasal spray, Place 1 spray into both nostrils in the morning and at bedtime., Disp: 9.9 mL, Rfl: 2 ?  ibuprofen (ADVIL) 600 MG tablet, Take 1 tablet (600 mg total) by mouth every 6 (six) hours., Disp: 30 tablet, Rfl: 0 ?  ipratropium-albuterol (DUONEB) 0.5-2.5 (3) MG/3ML SOLN, Take 3 mLs by nebulization every 6 (six) hours., Disp: 360 mL, Rfl: 5 ?  loratadine (CLARITIN) 10 MG tablet, Take 1 tablet (10 mg total) by mouth daily., Disp: 30 tablet, Rfl: 5 ?  mometasone-formoterol (DULERA) 100-5 MCG/ACT AERO, INHALE 2 PUFFS BY MOUTH TWICE DAILY. RINSE MOUTH AFTER EACH USE (Patient taking differently: Inhale 2 puffs into the lungs in the morning and  at bedtime. INHALE 2 PUFFS BY MOUTH TWICE DAILY. RINSE MOUTH AFTER EACH USE), Disp: 39 g, Rfl: 1 ?  montelukast (SINGULAIR) 10 MG tablet, Take 1 tablet (10 mg total) by mouth at bedtime., Disp: 30 tablet, Rfl: 3 ?  naproxen (NAPROSYN) 500 MG tablet, Take 1 tablet (500 mg total) by mouth 2 (two) times daily with a meal., Disp: 30 tablet, Rfl: 0  ? ?Allergies  ?Allergen Reactions  ? Benzyl Alcohol   ?  Burns skin  ? Cough Syrup [Guaifenesin] Nausea And Vomiting  ? Haemophilus Influenzae Vaccines   ?  Hospitalized for 14 days asthma attack.  Allergic to ALL VACCINES  ? Other Nausea And Vomiting  ?  Any coated medications  ? Petrolatum   ? Iodine Rash  ? ? ?Past Medical History:  ?Diagnosis Date  ? Asthma   ? Family history of adverse reaction to anesthesia   ? Migraines   ? Narcolepsy   ? PONV (postoperative nausea and vomiting)   ? Pregnancy 12/27/2019  ? PTSD (post-traumatic stress disorder)   ? Seasonal allergies   ?  ? ?Past Surgical History:  ?Procedure Laterality Date  ? CHOLECYSTECTOMY N/A 06/20/2020  ? Procedure: LAPAROSCOPIC CHOLECYSTECTOMY;  Surgeon: Aviva Signs, MD;  Location: [facility];  Service: General;  Laterality: N/A;  ? TONSILLECTOMY    ? WISDOM TOOTH EXTRACTION    ? ? ?Family History  ?Problem Relation Age of Onset  ? Diabetes Mother   ?  Hypertension Mother   ? Irritable bowel syndrome Mother   ? Diabetes Father   ? Cancer Father   ?     basal cell cancer  ? Cancer Maternal Uncle   ?     brain  ? Crohn's disease Maternal Aunt   ? Heart disease Maternal Grandmother   ? Heart disease Maternal Grandfather   ? Diabetes Maternal Grandfather   ? ? ?Social History  ? ?Tobacco Use  ? Smoking status: Never  ? Smokeless tobacco: Never  ?Vaping Use  ? Vaping Use: Never used  ?Substance Use Topics  ? Alcohol use: Not Currently  ?  Comment: occ  ? Drug use: No  ? ? ?ROS ? ? ?Objective:  ? ?Vitals: ?BP 120/79 (BP Location: Right Arm)   Pulse 95   Temp 98.2 ?F (36.8 ?C) (Oral)   Resp 18   Ht 5\' 4"  (1.626 m)   Wt  196 lb (88.9 kg)   LMP 02/02/2022 (Approximate)   SpO2 96%   BMI 33.64 kg/m?  ? ?Physical Exam ?Constitutional:   ?   General: She is not in acute distress. ?   Appearance: Normal appearance. She is well-developed and normal weight. She is not ill-appearing, toxic-appearing or diaphoretic.  ?HENT:  ?   Head: Normocephalic and atraumatic.  ?   Right Ear: Tympanic membrane, ear canal and external ear normal. No drainage or tenderness. No middle ear effusion. There is no impacted cerumen. Tympanic membrane is not erythematous.  ?   Left Ear: Tympanic membrane, ear canal and external ear normal. No drainage or tenderness.  No middle ear effusion. There is no impacted cerumen. Tympanic membrane is not erythematous.  ?   Nose: Congestion and rhinorrhea present.  ?   Mouth/Throat:  ?   Mouth: Mucous membranes are moist. No oral lesions.  ?   Pharynx: No pharyngeal swelling, oropharyngeal exudate, posterior oropharyngeal erythema or uvula swelling.  ?   Tonsils: No tonsillar exudate or tonsillar abscesses.  ?   Comments: Cobblestone pattern postnasal drainage overlying the pharynx. ?Eyes:  ?   General: No scleral icterus.    ?   Right eye: No discharge.     ?   Left eye: No discharge.  ?   Extraocular Movements: Extraocular movements intact.  ?   Right eye: Normal extraocular motion.  ?   Left eye: Normal extraocular motion.  ?   Conjunctiva/sclera: Conjunctivae normal.  ?Cardiovascular:  ?   Rate and Rhythm: Normal rate.  ?   Heart sounds: No murmur heard. ?  No friction rub. No gallop.  ?Pulmonary:  ?   Effort: Pulmonary effort is normal. No respiratory distress.  ?   Breath sounds: No stridor. Examination of the right-lower field reveals wheezing. Examination of the left-lower field reveals wheezing. Wheezing present. No rhonchi or rales.  ?Chest:  ?   Chest wall: No tenderness.  ?Musculoskeletal:  ?   Cervical back: Normal range of motion and neck supple.  ?Lymphadenopathy:  ?   Cervical: No cervical adenopathy.   ?Skin: ?   General: Skin is warm and dry.  ?Neurological:  ?   General: No focal deficit present.  ?   Mental Status: She is alert and oriented to person, place, and time.  ?Psychiatric:     ?   Mood and Affect: Mood normal.     ?   Behavior: Behavior normal.  ? ? ?Results for orders placed or performed during the hospital encounter of 02/28/22 (  from the past 24 hour(s))  ?POCT rapid strep A     Status: None  ? Collection Time: 02/28/22  2:17 PM  ?Result Value Ref Range  ? Rapid Strep A Screen Negative Negative  ? ? ?Assessment and Plan :  ? ?PDMP not reviewed this encounter. ? ?1. Acute viral syndrome   ?2. Allergic rhinitis, unspecified seasonality, unspecified trigger   ?3. Sinus congestion   ?4. Moderate persistent asthma without complication   ? ?In the context of her wheezing, asthma recommended an oral prednisone course.  Refilled her albuterol inhaler.  Use supportive care otherwise for an acute viral syndrome.  Strep culture pending.  Patient declined a COVID and flu test.  There are no sound suspicion for bronchitis or pneumonia on exam and therefore deferred imaging.  Counseled patient on potential for adverse effects with medications prescribed/recommended today, ER and return-to-clinic precautions discussed, patient verbalized understanding. ? ?  ?Jaynee Eagles, PA-C ?02/28/22 1428 ? ?

## 2022-03-03 LAB — CULTURE, GROUP A STREP (THRC)

## 2022-03-06 ENCOUNTER — Ambulatory Visit
Admission: RE | Admit: 2022-03-06 | Discharge: 2022-03-06 | Disposition: A | Discharge status: Home / Self Care | Payer: Self-pay | Source: Ambulatory Visit | Attending: Urgent Care | Admitting: Urgent Care

## 2022-03-06 ENCOUNTER — Other Ambulatory Visit: Payer: Self-pay

## 2022-03-06 VITALS — BP 116/80 | HR 94 | Temp 97.9°F | Resp 18

## 2022-03-06 DIAGNOSIS — H9201 Otalgia, right ear: Secondary | ICD-10-CM

## 2022-03-06 DIAGNOSIS — H65191 Other acute nonsuppurative otitis media, right ear: Secondary | ICD-10-CM

## 2022-03-06 MED ORDER — CEFDINIR 300 MG PO CAPS: 300.0000 mg | ORAL_CAPSULE | Freq: Two times a day (BID) | ORAL | 0 refills | Status: DC

## 2022-03-06 NOTE — ED Provider Notes (Signed)
?[facility] ? ? ?MRN: [id number] DOB: 09/23/1996 ? ?Subjective:  ? ?Amy [name] is a 26 y.o. female presenting for 4 day history of acute onset right ear pain with drainage.  Patient was last seen 02/28/2022.  She underwent a course of prednisone for her breathing, asthma and was managed for an acute viral syndrome with supportive care.  Strep culture was negative from that visit.  She last took a course of Augmentin for an oral abscess 11/23/2021. ? ?No current facility-administered medications for this encounter. ? ?Current Outpatient Medications:  ?  acetaminophen (TYLENOL) 325 MG tablet, Take 2 tablets (650 mg total) by mouth every 4 (four) hours as needed (for pain scale < 4)., Disp: 30 tablet, Rfl: 0 ?  albuterol (VENTOLIN HFA) 108 (90 Base) MCG/ACT inhaler, Inhale 1-2 puffs into the lungs every 6 (six) hours as needed for wheezing or shortness of breath., Disp: 18 g, Rfl: 0 ?  amoxicillin-clavulanate (AUGMENTIN) 875-125 MG tablet, Take 1 tablet by mouth 2 (two) times daily., Disp: 14 tablet, Rfl: 0 ?  benzonatate (TESSALON) 100 MG capsule, Take 1-2 capsules (100-200 mg total) by mouth 3 (three) times daily as needed for cough., Disp: 60 capsule, Rfl: 0 ?  cetirizine (ZYRTEC) 10 MG tablet, Take 1 tablet (10 mg total) by mouth daily., Disp: 30 tablet, Rfl: 0 ?  diphenhydrAMINE (BENADRYL) 25 MG tablet, Take 25 mg by mouth every 6 (six) hours as needed., Disp: , Rfl:  ?  fluticasone (FLONASE) 50 MCG/ACT nasal spray, Place 1 spray into both nostrils in the morning and at bedtime., Disp: 9.9 mL, Rfl: 2 ?  ibuprofen (ADVIL) 600 MG tablet, Take 1 tablet (600 mg total) by mouth every 6 (six) hours., Disp: 30 tablet, Rfl: 0 ?  ipratropium-albuterol (DUONEB) 0.5-2.5 (3) MG/3ML SOLN, Take 3 mLs by nebulization every 6 (six) hours., Disp: 360 mL, Rfl: 5 ?  levocetirizine (XYZAL) 5 MG tablet, Take 1 tablet (5 mg total) by mouth every evening., Disp: 90 tablet, Rfl: 0 ?  loratadine  (CLARITIN) 10 MG tablet, Take 1 tablet (10 mg total) by mouth daily., Disp: 30 tablet, Rfl: 5 ?  mometasone-formoterol (DULERA) 100-5 MCG/ACT AERO, INHALE 2 PUFFS BY MOUTH TWICE DAILY. RINSE MOUTH AFTER EACH USE (Patient taking differently: Inhale 2 puffs into the lungs in the morning and at bedtime. INHALE 2 PUFFS BY MOUTH TWICE DAILY. RINSE MOUTH AFTER EACH USE), Disp: 39 g, Rfl: 1 ?  montelukast (SINGULAIR) 10 MG tablet, Take 1 tablet (10 mg total) by mouth at bedtime., Disp: 30 tablet, Rfl: 3 ?  naproxen (NAPROSYN) 500 MG tablet, Take 1 tablet (500 mg total) by mouth 2 (two) times daily with a meal., Disp: 30 tablet, Rfl: 0 ?  predniSONE (DELTASONE) 50 MG tablet, Take 1 tablet (50 mg total) by mouth daily with breakfast., Disp: 5 tablet, Rfl: 0  ? ?Allergies  ?Allergen Reactions  ? Benzyl Alcohol   ?  Burns skin  ? Cough Syrup [Guaifenesin] Nausea And Vomiting  ? Haemophilus Influenzae Vaccines   ?  Hospitalized for 14 days asthma attack.  Allergic to ALL VACCINES  ? Other Nausea And Vomiting  ?  Any coated medications  ? Petrolatum   ? Iodine Rash  ? ? ?Past Medical History:  ?Diagnosis Date  ? Asthma   ? Family history of adverse reaction to anesthesia   ? Migraines   ? Narcolepsy   ? PONV (postoperative nausea and vomiting)   ? Pregnancy 12/27/2019  ?  PTSD (post-traumatic stress disorder)   ? Seasonal allergies   ?  ? ?Past Surgical History:  ?Procedure Laterality Date  ? CHOLECYSTECTOMY N/A 06/20/2020  ? Procedure: LAPAROSCOPIC CHOLECYSTECTOMY;  Surgeon: Franky Macho, MD;  Location: [facility];  Service: General;  Laterality: N/A;  ? TONSILLECTOMY    ? WISDOM TOOTH EXTRACTION    ? ? ?Family History  ?Problem Relation Age of Onset  ? Diabetes Mother   ? Hypertension Mother   ? Irritable bowel syndrome Mother   ? Diabetes Father   ? Cancer Father   ?     basal cell cancer  ? Cancer Maternal Uncle   ?     brain  ? Crohn's disease Maternal Aunt   ? Heart disease Maternal Grandmother   ? Heart disease Maternal  Grandfather   ? Diabetes Maternal Grandfather   ? ? ?Social History  ? ?Tobacco Use  ? Smoking status: Never  ? Smokeless tobacco: Never  ?Vaping Use  ? Vaping Use: Never used  ?Substance Use Topics  ? Alcohol use: Not Currently  ?  Comment: occ  ? Drug use: No  ? ? ?ROS ? ? ?Objective:  ? ?Vitals: ?BP 116/80   Pulse 94   Temp 97.9 ?F (36.6 ?C)   Resp 18   SpO2 95%  ? ?Physical Exam ?Constitutional:   ?   General: She is not in acute distress. ?   Appearance: Normal appearance. She is well-developed and normal weight. She is not ill-appearing, toxic-appearing or diaphoretic.  ?HENT:  ?   Head: Normocephalic and atraumatic.  ?   Right Ear: Ear canal and external ear normal. No drainage or tenderness. No middle ear effusion. There is no impacted cerumen. Tympanic membrane is not erythematous.  ?   Left Ear: Tympanic membrane, ear canal and external ear normal. No drainage or tenderness.  No middle ear effusion. There is no impacted cerumen. Tympanic membrane is not erythematous.  ?   Ears:  ?   Comments: Erythematous and bulging right TM. ?   Nose: Nose normal. No congestion or rhinorrhea.  ?   Mouth/Throat:  ?   Mouth: Mucous membranes are moist. No oral lesions.  ?   Pharynx: No pharyngeal swelling, oropharyngeal exudate, posterior oropharyngeal erythema or uvula swelling.  ?   Tonsils: No tonsillar exudate or tonsillar abscesses.  ?Eyes:  ?   General: No scleral icterus.    ?   Right eye: No discharge.     ?   Left eye: No discharge.  ?   Extraocular Movements: Extraocular movements intact.  ?   Right eye: Normal extraocular motion.  ?   Left eye: Normal extraocular motion.  ?   Conjunctiva/sclera: Conjunctivae normal.  ?Cardiovascular:  ?   Rate and Rhythm: Normal rate.  ?Pulmonary:  ?   Effort: Pulmonary effort is normal.  ?Musculoskeletal:  ?   Cervical back: Normal range of motion and neck supple.  ?Lymphadenopathy:  ?   Cervical: No cervical adenopathy.  ?Skin: ?   General: Skin is warm and dry.   ?Neurological:  ?   General: No focal deficit present.  ?   Mental Status: She is alert and oriented to person, place, and time.  ?Psychiatric:     ?   Mood and Affect: Mood normal.     ?   Behavior: Behavior normal.     ?   Thought Content: Thought content normal.     ?   Judgment: Judgment normal.  ? ? ?  Assessment and Plan :  ? ?PDMP not reviewed this encounter. ? ?1. Other non-recurrent acute nonsuppurative otitis media of right ear   ?2. Otalgia of right ear   ? ?Start cefdinir to cover for otitis media. Use supportive care otherwise. Counseled patient on potential for adverse effects with medications prescribed/recommended today, ER and return-to-clinic precautions discussed, patient verbalized understanding. ? ?  ?Wallis BambergMani, Myonna Chisom, PA-C ?03/06/22 [id number] ? ?

## 2022-03-06 NOTE — ED Triage Notes (Signed)
Pt presents with c/o right ear fullness with drainage for past 4 days  ?

## 2022-05-28 ENCOUNTER — Ambulatory Visit
Admission: RE | Admit: 2022-05-28 | Discharge: 2022-05-28 | Disposition: A | Discharge status: Home / Self Care | Payer: Self-pay | Source: Ambulatory Visit | Attending: Nurse Practitioner | Admitting: Nurse Practitioner

## 2022-05-28 VITALS — BP 115/77 | HR 89 | Temp 99.0°F | Resp 18

## 2022-05-28 DIAGNOSIS — R300 Dysuria: Secondary | ICD-10-CM | POA: Insufficient documentation

## 2022-05-28 DIAGNOSIS — N898 Other specified noninflammatory disorders of vagina: Secondary | ICD-10-CM | POA: Insufficient documentation

## 2022-05-28 LAB — POCT URINALYSIS DIP (MANUAL ENTRY)
Bilirubin, UA: NEGATIVE
Glucose, UA: NEGATIVE mg/dL
Ketones, POC UA: NEGATIVE mg/dL
Nitrite, UA: NEGATIVE
Spec Grav, UA: >=1.03 — AB (ref 1.010–1.025)
Urobilinogen, UA: 0.2 E.U./dL
pH, UA: 5 (ref 5.0–8.0)

## 2022-05-28 MED ORDER — NITROFURANTOIN MONOHYD MACRO 100 MG PO CAPS: 100.0000 mg | ORAL_CAPSULE | Freq: Two times a day (BID) | ORAL | 0 refills | Status: AC

## 2022-05-28 NOTE — ED Provider Notes (Signed)
[facility]    CSN: [id number] Arrival date & time: 05/28/22  1039      History   Chief Complaint Chief Complaint  Patient presents with   Vaginal Discharge    It's a UTI, it started over a week ago, it doesn't hurt to use the bathroom anymore, but i can't break my fever, chills, and Body aches - Entered by patient   Fever   Appointment    1100    HPI Amy [name] is a 26 y.o. female.   Patient presents with 1 week of dysuria, lower abdominal pain, fever, chills, body aches, and yellow vaginal discharge.  She reports the discharge has an odor to it and is thin and is also associated with vaginal itching and irritation.  She reports the dysuria has not improved, however she still does have some lower abdominal pain.  She denies flank pain, nausea/vomiting.  She has been drinking plenty of water and cranberry juice which she thinks have helped her symptoms.  She is not currently sexually active, however has been sexual active in the past and is requesting STI testing today.   Past Medical History:  Diagnosis Date   Asthma    Family history of adverse reaction to anesthesia    Migraines    Narcolepsy    PONV (postoperative nausea and vomiting)    Pregnancy 12/27/2019   PTSD (post-traumatic stress disorder)    Seasonal allergies     Patient Active Problem List   Diagnosis Date Noted   Stress incontinence 02/15/2021   Ovarian cyst 06/30/2020   PTSD (post-traumatic stress disorder) 06/30/2020   Anxiety 06/30/2020   History of laparoscopic cholecystectomy 06/21/2020   Asthma 02/25/2019   Eczema 02/25/2019    Past Surgical History:  Procedure Laterality Date   CHOLECYSTECTOMY N/A 06/20/2020   Procedure: LAPAROSCOPIC CHOLECYSTECTOMY;  Surgeon: Franky MachoJenkins, Mark, MD;  Location: [facility];  Service: General;  Laterality: N/A;   TONSILLECTOMY     WISDOM TOOTH EXTRACTION      OB History     Gravida  1   Para      Term      Preterm      AB       Living  1      SAB      IAB      Ectopic      Multiple      Live Births               Home Medications    Prior to Admission medications   Medication Sig Start Date End Date Taking? Authorizing Provider  nitrofurantoin, macrocrystal-monohydrate, (MACROBID) 100 MG capsule Take 1 capsule (100 mg total) by mouth 2 (two) times daily for 5 days. 05/28/22 06/02/22 Yes Valentino NoseMartinez, Annalysse Shoemaker A, NP  acetaminophen (TYLENOL) 325 MG tablet Take 2 tablets (650 mg total) by mouth every 4 (four) hours as needed (for pain scale < 4). 01/12/21   Shirlean MylarMahoney, Caitlin, MD  albuterol (VENTOLIN HFA) 108 (90 Base) MCG/ACT inhaler Inhale 1-2 puffs into the lungs every 6 (six) hours as needed for wheezing or shortness of breath. 02/28/22   Wallis BambergMani, Mario, PA-C  cetirizine (ZYRTEC) 10 MG tablet Take 1 tablet (10 mg total) by mouth daily. 11/19/21   Bing NeighborsHarris, Kimberly S, FNP  diphenhydrAMINE (BENADRYL) 25 MG tablet Take 25 mg by mouth every 6 (six) hours as needed.    [provider]  fluticasone (FLONASE) 50 MCG/ACT nasal spray Place 1 spray  into both nostrils in the morning and at bedtime. 12/31/20 12/31/21  Levie Heritage, DO  ibuprofen (ADVIL) 600 MG tablet Take 1 tablet (600 mg total) by mouth every 6 (six) hours. 01/12/21   Shirlean Mylar, MD  ipratropium-albuterol (DUONEB) 0.5-2.5 (3) MG/3ML SOLN Take 3 mLs by nebulization every 6 (six) hours. 04/14/20   Shahmehdi, Gemma Payor, MD  levocetirizine (XYZAL) 5 MG tablet Take 1 tablet (5 mg total) by mouth every evening. 02/28/22   Wallis Bamberg, PA-C  loratadine (CLARITIN) 10 MG tablet Take 1 tablet (10 mg total) by mouth daily. 04/14/20 04/14/21  Shahmehdi, Gemma Payor, MD  mometasone-formoterol (DULERA) 100-5 MCG/ACT AERO INHALE 2 PUFFS BY MOUTH TWICE DAILY. RINSE MOUTH AFTER EACH USE Patient taking differently: Inhale 2 puffs into the lungs in the morning and at bedtime. INHALE 2 PUFFS BY MOUTH TWICE DAILY. RINSE MOUTH AFTER EACH USE 04/14/20   Shahmehdi, Gemma Payor, MD   montelukast (SINGULAIR) 10 MG tablet Take 1 tablet (10 mg total) by mouth at bedtime. 04/14/20   Kendell Bane, MD  naproxen (NAPROSYN) 500 MG tablet Take 1 tablet (500 mg total) by mouth 2 (two) times daily with a meal. 11/23/21   Wallis Bamberg, PA-C    Family History Family History  Problem Relation Age of Onset   Diabetes Mother    Hypertension Mother    Irritable bowel syndrome Mother    Diabetes Father    Cancer Father        basal cell cancer   Cancer Maternal Uncle        brain   Crohn's disease Maternal Aunt    Heart disease Maternal Grandmother    Heart disease Maternal Grandfather    Diabetes Maternal Grandfather     Social History Social History   Tobacco Use   Smoking status: Never   Smokeless tobacco: Never  Vaping Use   Vaping Use: Never used  Substance Use Topics   Alcohol use: Not Currently    Comment: occ   Drug use: No     Allergies   Benzyl alcohol, Cough syrup [guaifenesin], Haemophilus influenzae vaccines, Other, Petrolatum, and Iodine   Review of Systems Review of Systems Per HPI  Physical Exam Triage Vital Signs ED Triage Vitals  Enc Vitals Group     BP 05/28/22 1111 115/77     Pulse Rate 05/28/22 1111 89     Resp 05/28/22 1111 18     Temp 05/28/22 1111 99 F (37.2 C)     Temp Source 05/28/22 1111 Oral     SpO2 05/28/22 1111 95 %     Weight --      Height --      Head Circumference --      Peak Flow --      Pain Score 05/28/22 1109 6     Pain Loc --      Pain Edu? --      Excl. in GC? --    No data found.  Updated Vital Signs BP 115/77 (BP Location: Right Arm)   Pulse 89   Temp 99 F (37.2 C) (Oral)   Resp 18   LMP  (Within Weeks) Comment: 2 weeks  SpO2 95%   Visual Acuity Right Eye Distance:   Left Eye Distance:   Bilateral Distance:    Right Eye Near:   Left Eye Near:    Bilateral Near:     Physical Exam Vitals and nursing note reviewed.  Constitutional:  General: She is not in acute distress.     Appearance: She is not toxic-appearing.  Pulmonary:     Effort: Pulmonary effort is normal. No respiratory distress.  Abdominal:     General: Abdomen is flat. Bowel sounds are normal. There is no distension.     Palpations: Abdomen is soft. There is no mass.     Tenderness: There is no abdominal tenderness. There is no right CVA tenderness, left CVA tenderness or guarding.  Skin:    General: Skin is warm and dry.     Coloration: Skin is not jaundiced or pale.     Findings: No erythema.  Neurological:     Mental Status: She is alert and oriented to person, place, and time.     Motor: No weakness.     Gait: Gait normal.  Psychiatric:        Behavior: Behavior is cooperative.     UC Treatments / Results  Labs (all labs ordered are listed, but only abnormal results are displayed) Labs Reviewed  POCT URINALYSIS DIP (MANUAL ENTRY) - Abnormal; Notable for the following components:      Result Value   Clarity, UA cloudy (*)    Spec Grav, UA >=1.030 (*)    Blood, UA trace-intact (*)    Protein Ur, POC trace (*)    Leukocytes, UA Small (1+) (*)    All other components within normal limits  URINE CULTURE  RPR  HIV ANTIBODY (ROUTINE TESTING W REFLEX)  CERVICOVAGINAL ANCILLARY ONLY    EKG   Radiology No results found.  Procedures Procedures (including critical care time)  Medications Ordered in UC Medications - No data to display  Initial Impression / Assessment and Plan / UC Course  I have reviewed the triage vital signs and the nursing notes.  Pertinent labs & imaging results that were available during my care of the patient were reviewed by me and considered in my medical decision making (see chart for details).  Clinical Course as of 05/28/22 1142  Mon May 28, 2022  1132 Cervicovaginal ancillary only [JM]    Clinical Course User Index [JM] Valentino Nose, NP    UA today is suggestive of a urinary tract infection; will treat with Macrobid twice daily for 5  days while awaiting urine culture results.  Send vaginal self swab off of for testing for gonorrhea, chlamydia, trichomonas, yeast vaginitis, and bacterial vaginosis.  Treat as indicated.  Encouraged condom use with every sexual encounter to prevent spread of STI.  Also encourage plenty of hydration with water.  ER precautions discussed.  Seek care if symptoms persist or worsen despite treatment. Final Clinical Impressions(s) / UC Diagnoses   Final diagnoses:  Vaginal discharge  Dysuria     Discharge Instructions      - The urine test is suggestive of a UTI today; please start the Macrobid (antibiotic) while we are sending the urine out for a culture and we will let you know if we need to switch antibiotics later this week - We will let you know if the vaginal testing or HIV/syphilis testing come back positive  - Please use condoms with every sexual encounter to prevent STI  - If your symptoms worsen or persist despite treatment, seek care    ED Prescriptions     Medication Sig Dispense Auth. Provider   nitrofurantoin, macrocrystal-monohydrate, (MACROBID) 100 MG capsule Take 1 capsule (100 mg total) by mouth 2 (two) times daily for 5 days. 10 capsule Bradly Bienenstock,  Guadlupe Spanish, NP      PDMP not reviewed this encounter.   Valentino Nose, NP 05/28/22 1142

## 2022-05-28 NOTE — ED Triage Notes (Signed)
Pt reports malodorous  yellow vaginal, fever, headache, fever, headache and fatigue, chills x 1 week. States she had burning. Ibuprofen gives no relief.     Pt requested urine test.

## 2022-05-28 NOTE — Discharge Instructions (Addendum)
-   The urine test is suggestive of a UTI today; please start the Macrobid (antibiotic) while we are sending the urine out for a culture and we will let you know if we need to switch antibiotics later this week - We will let you know if the vaginal testing or HIV/syphilis testing come back positive  - Please use condoms with every sexual encounter to prevent STI  - If your symptoms worsen or persist despite treatment, seek care

## 2022-05-29 ENCOUNTER — Telehealth (HOSPITAL_COMMUNITY): Payer: Self-pay | Admitting: Emergency Medicine

## 2022-05-29 LAB — CERVICOVAGINAL ANCILLARY ONLY
Bacterial Vaginitis (gardnerella): POSITIVE — AB
Candida Glabrata: NEGATIVE
Candida Vaginitis: NEGATIVE
Chlamydia: POSITIVE — AB
Comment: NEGATIVE
Comment: NEGATIVE
Comment: NEGATIVE
Comment: NEGATIVE
Comment: NEGATIVE
Comment: NORMAL
Neisseria Gonorrhea: NEGATIVE
Trichomonas: POSITIVE — AB

## 2022-05-29 LAB — URINE CULTURE: Culture: NO GROWTH

## 2022-05-29 LAB — RPR: RPR Ser Ql: NONREACTIVE

## 2022-05-29 LAB — HIV ANTIBODY (ROUTINE TESTING W REFLEX): HIV Screen 4th Generation wRfx: NONREACTIVE

## 2022-05-29 MED ORDER — METRONIDAZOLE 500 MG PO TABS: 500.0000 mg | ORAL_TABLET | Freq: Two times a day (BID) | ORAL | 0 refills | Status: DC

## 2022-05-29 MED ORDER — DOXYCYCLINE HYCLATE 100 MG PO CAPS: 100.0000 mg | ORAL_CAPSULE | Freq: Two times a day (BID) | ORAL | 0 refills | Status: AC

## 2022-09-22 ENCOUNTER — Emergency Department (HOSPITAL_COMMUNITY)
Admission: EM | Admit: 2022-09-22 | Discharge: 2022-09-22 | Disposition: A | Discharge status: Home / Self Care | Payer: Medicaid Other | Attending: Emergency Medicine | Admitting: Emergency Medicine

## 2022-09-22 ENCOUNTER — Encounter (HOSPITAL_COMMUNITY): Payer: Self-pay | Admitting: *Deleted

## 2022-09-22 ENCOUNTER — Emergency Department (HOSPITAL_COMMUNITY): Payer: Medicaid Other

## 2022-09-22 ENCOUNTER — Ambulatory Visit: Payer: Self-pay

## 2022-09-22 ENCOUNTER — Other Ambulatory Visit: Payer: Self-pay

## 2022-09-22 DIAGNOSIS — Z7952 Long term (current) use of systemic steroids: Secondary | ICD-10-CM | POA: Diagnosis not present

## 2022-09-22 DIAGNOSIS — Z7951 Long term (current) use of inhaled steroids: Secondary | ICD-10-CM | POA: Insufficient documentation

## 2022-09-22 DIAGNOSIS — J4521 Mild intermittent asthma with (acute) exacerbation: Secondary | ICD-10-CM | POA: Insufficient documentation

## 2022-09-22 DIAGNOSIS — R0602 Shortness of breath: Secondary | ICD-10-CM | POA: Diagnosis present

## 2022-09-22 MED ORDER — IPRATROPIUM-ALBUTEROL 0.5-2.5 (3) MG/3ML IN SOLN
3.0000 mL | Freq: Once | RESPIRATORY_TRACT | Status: AC
  Administered 2022-09-22: 3 mL via RESPIRATORY_TRACT
  Filled 2022-09-22: qty 3

## 2022-09-22 MED ORDER — ALBUTEROL SULFATE HFA 108 (90 BASE) MCG/ACT IN AERS
2.0000 | INHALATION_SPRAY | Freq: Once | RESPIRATORY_TRACT | Status: AC
  Administered 2022-09-22: 2 via RESPIRATORY_TRACT
  Filled 2022-09-22: qty 6.7

## 2022-09-22 MED ORDER — DOXYCYCLINE HYCLATE 100 MG PO CAPS: 100.0000 mg | ORAL_CAPSULE | Freq: Two times a day (BID) | ORAL | 0 refills | Status: DC

## 2022-09-22 MED ORDER — ALBUTEROL SULFATE (2.5 MG/3ML) 0.083% IN NEBU
2.5000 mg | INHALATION_SOLUTION | Freq: Once | RESPIRATORY_TRACT | Status: AC
  Administered 2022-09-22: 2.5 mg via RESPIRATORY_TRACT
  Filled 2022-09-22: qty 3

## 2022-09-22 MED ORDER — PREDNISONE 50 MG PO TABS
60.0000 mg | ORAL_TABLET | Freq: Once | ORAL | Status: AC
  Administered 2022-09-22: 60 mg via ORAL
  Filled 2022-09-22: qty 1

## 2022-09-22 MED ORDER — PREDNISONE 20 MG PO TABS: ORAL_TABLET | ORAL | 0 refills | Status: DC

## 2022-09-22 NOTE — ED Provider Notes (Signed)
[facility] EMERGENCY DEPARTMENT Provider Note   CSN: [id number] Arrival date & time: 09/22/22  1050     History {Add pertinent medical, surgical, social history, OB history to HPI:1} Chief Complaint  Patient presents with   [place] is a 26 y.o. female.  Patient complains of shortness of breath and yellow sputum production.  She has a history of asthma   Asthma       Home Medications Prior to Admission medications   Medication Sig Start Date End Date Taking? Authorizing Provider  doxycycline (VIBRAMYCIN) 100 MG capsule Take 1 capsule (100 mg total) by mouth 2 (two) times daily. One po bid x 7 days 09/22/22  Yes Milton Ferguson, MD  predniSONE (DELTASONE) 20 MG tablet 2 tabs po daily x 3 days 09/22/22  Yes Milton Ferguson, MD  acetaminophen (TYLENOL) 325 MG tablet Take 2 tablets (650 mg total) by mouth every 4 (four) hours as needed (for pain scale < 4). 01/12/21   Gladys Damme, MD  albuterol (VENTOLIN HFA) 108 (90 Base) MCG/ACT inhaler Inhale 1-2 puffs into the lungs every 6 (six) hours as needed for wheezing or shortness of breath. 02/28/22   Jaynee Eagles, PA-C  cetirizine (ZYRTEC) 10 MG tablet Take 1 tablet (10 mg total) by mouth daily. 11/19/21   Scot Jun, FNP  diphenhydrAMINE (BENADRYL) 25 MG tablet Take 25 mg by mouth every 6 (six) hours as needed.    [provider]  fluticasone (FLONASE) 50 MCG/ACT nasal spray Place 1 spray into both nostrils in the morning and at bedtime. 12/31/20 12/31/21  Truett Mainland, DO  ibuprofen (ADVIL) 600 MG tablet Take 1 tablet (600 mg total) by mouth every 6 (six) hours. 01/12/21   Gladys Damme, MD  ipratropium-albuterol (DUONEB) 0.5-2.5 (3) MG/3ML SOLN Take 3 mLs by nebulization every 6 (six) hours. 04/14/20   Shahmehdi, Valeria Batman, MD  levocetirizine (XYZAL) 5 MG tablet Take 1 tablet (5 mg total) by mouth every evening. 02/28/22   Jaynee Eagles, PA-C  loratadine (CLARITIN) 10 MG tablet Take 1 tablet  (10 mg total) by mouth daily. 04/14/20 04/14/21  Deatra James, MD  metroNIDAZOLE (FLAGYL) 500 MG tablet Take 1 tablet (500 mg total) by mouth 2 (two) times daily. 05/29/22   Lamptey, Myrene Galas, MD  mometasone-formoterol (DULERA) 100-5 MCG/ACT AERO INHALE 2 PUFFS BY MOUTH TWICE DAILY. RINSE MOUTH AFTER EACH USE Patient taking differently: Inhale 2 puffs into the lungs in the morning and at bedtime. INHALE 2 PUFFS BY MOUTH TWICE DAILY. RINSE MOUTH AFTER EACH USE 04/14/20   Shahmehdi, Valeria Batman, MD  montelukast (SINGULAIR) 10 MG tablet Take 1 tablet (10 mg total) by mouth at bedtime. 04/14/20   Deatra James, MD  naproxen (NAPROSYN) 500 MG tablet Take 1 tablet (500 mg total) by mouth 2 (two) times daily with a meal. 11/23/21   Jaynee Eagles, PA-C      Allergies    Benzyl alcohol, Cough syrup [guaifenesin], Haemophilus influenzae vaccines, Other, Petrolatum, and Iodine    Review of Systems   Review of Systems  Physical Exam Updated Vital Signs BP 129/76   Pulse 88   Temp 97.6 F (36.4 C) (Temporal)   Resp 17   Ht 5\' 4"  (1.626 m)   Wt 83.9 kg   LMP 08/24/2022 (Approximate)   SpO2 99%   BMI 31.76 kg/m  Physical Exam  ED Results / Procedures / Treatments   Labs (all labs ordered are listed,  but only abnormal results are displayed) Labs Reviewed - No data to display  EKG None  Radiology DG Chest 2 View  Result Date: 09/22/2022 CLINICAL DATA:  sob EXAM: CHEST - 2 VIEW COMPARISON:  April 13, 2020 FINDINGS: The cardiomediastinal silhouette is normal in contour. No pleural effusion. No pneumothorax. Mild peribronchial cuffing. No focal consolidation. Visualized abdomen is unremarkable. No acute osseous abnormality noted. IMPRESSION: Mild peribronchial cuffing as can be seen in setting of small airways disease. Clear lungs. Electronically Signed   By: Meda Klinefelter M.D.   On: 09/22/2022 12:04    Procedures Procedures  {Document cardiac monitor, telemetry assessment procedure when  appropriate:1}  Medications Ordered in ED Medications  albuterol (VENTOLIN HFA) 108 (90 Base) MCG/ACT inhaler 2 puff (has no administration in time range)  ipratropium-albuterol (DUONEB) 0.5-2.5 (3) MG/3ML nebulizer solution 3 mL (3 mLs Nebulization Given 09/22/22 1206)  albuterol (PROVENTIL) (2.5 MG/3ML) 0.083% nebulizer solution 2.5 mg (2.5 mg Nebulization Given 09/22/22 1217)  predniSONE (DELTASONE) tablet 60 mg (60 mg Oral Given 09/22/22 1156)    ED Course/ Medical Decision Making/ A&P                           Medical Decision Making Amount and/or Complexity of Data Reviewed Radiology: ordered.  Risk Prescription drug management.  Patient with asthma exacerbation.  Patient improved with neb treatments and will be discharged home on prednisone and doxycycline and given another albuterol inhaler  {Document critical care time when appropriate:1} {Document review of labs and clinical decision tools ie heart score, Chads2Vasc2 etc:1}  {Document your independent review of radiology images, and any outside records:1} {Document your discussion with family members, caretakers, and with consultants:1} {Document social determinants of health affecting pt's care:1} {Document your decision making why or why not admission, treatments were needed:1} Final Clinical Impression(s) / ED Diagnoses Final diagnoses:  Mild intermittent asthma with exacerbation    Rx / DC Orders ED Discharge Orders          Ordered    predniSONE (DELTASONE) 20 MG tablet        09/22/22 1528    doxycycline (VIBRAMYCIN) 100 MG capsule  2 times daily        09/22/22 1528

## 2022-09-22 NOTE — ED Triage Notes (Signed)
Pt with hx of asthma, for past week has gotten worse.  Pt states she has used her inhaler x 4 last hour.  Pt states she has ran out of her rescue inhaler.

## 2022-09-22 NOTE — Discharge Instructions (Signed)
Follow-up with your doctor next week if any problems 

## 2022-10-24 ENCOUNTER — Ambulatory Visit
Admission: RE | Admit: 2022-10-24 | Discharge: 2022-10-24 | Disposition: A | Discharge status: Home / Self Care | Payer: Medicaid Other | Source: Ambulatory Visit | Attending: Nurse Practitioner | Admitting: Nurse Practitioner

## 2022-10-24 VITALS — BP 110/55 | HR 100 | Temp 97.9°F | Resp 18

## 2022-10-24 DIAGNOSIS — H9201 Otalgia, right ear: Secondary | ICD-10-CM | POA: Diagnosis not present

## 2022-10-24 DIAGNOSIS — H66001 Acute suppurative otitis media without spontaneous rupture of ear drum, right ear: Secondary | ICD-10-CM | POA: Diagnosis not present

## 2022-10-24 MED ORDER — AMOXICILLIN 875 MG PO TABS: 875.0000 mg | ORAL_TABLET | Freq: Two times a day (BID) | ORAL | 0 refills | Status: AC

## 2022-10-24 NOTE — ED Provider Notes (Signed)
[facility]    CSN: [id number] Arrival date & time: 10/24/22  1100      History   Chief Complaint Chief Complaint  Patient presents with   Ear Fullness    Feels like rushing water and pain inside, and popping sounds. - Entered by patient   Appointment    1100    HPI Amy [name] is a 26 y.o. female.   Patient presents for ear pain that began this morning when she woke up.  Reports she woke up with a sharp, stabbing pain in her right ear.  Denies ear drainage.  She used a Q-tip after the ear pain started to try to clean out her ear, did not affect the ear pain.  She denies recent cough, sore throat, congestion, or recent viral upper respiratory infection symptoms.  She has taken ibuprofen for the ear pain which helped temporarily.  No recent water immersion.  Reports her hearing is decreased out of the ear and there is a lot of echoing.    Past Medical History:  Diagnosis Date   Asthma    Family history of adverse reaction to anesthesia    Migraines    Narcolepsy    PONV (postoperative nausea and vomiting)    Pregnancy 12/27/2019   PTSD (post-traumatic stress disorder)    Seasonal allergies     Patient Active Problem List   Diagnosis Date Noted   Stress incontinence 02/15/2021   Ovarian cyst 06/30/2020   PTSD (post-traumatic stress disorder) 06/30/2020   Anxiety 06/30/2020   History of laparoscopic cholecystectomy 06/21/2020   Asthma 02/25/2019   Eczema 02/25/2019    Past Surgical History:  Procedure Laterality Date   CHOLECYSTECTOMY N/A 06/20/2020   Procedure: LAPAROSCOPIC CHOLECYSTECTOMY;  Surgeon: Franky Macho, MD;  Location: [facility];  Service: General;  Laterality: N/A;   TONSILLECTOMY     WISDOM TOOTH EXTRACTION      OB History     Gravida  1   Para      Term      Preterm      AB      Living  1      SAB      IAB      Ectopic      Multiple      Live Births               Home Medications    Prior to  Admission medications   Medication Sig Start Date End Date Taking? Authorizing Provider  amoxicillin (AMOXIL) 875 MG tablet Take 1 tablet (875 mg total) by mouth 2 (two) times daily for 5 days. 10/24/22 10/29/22 Yes Valentino Nose, NP  acetaminophen (TYLENOL) 325 MG tablet Take 2 tablets (650 mg total) by mouth every 4 (four) hours as needed (for pain scale < 4). 01/12/21   Shirlean Mylar, MD  albuterol (VENTOLIN HFA) 108 (90 Base) MCG/ACT inhaler Inhale 1-2 puffs into the lungs every 6 (six) hours as needed for wheezing or shortness of breath. 02/28/22   Wallis Bamberg, PA-C  cetirizine (ZYRTEC) 10 MG tablet Take 1 tablet (10 mg total) by mouth daily. 11/19/21   Bing Neighbors, FNP  diphenhydrAMINE (BENADRYL) 25 MG tablet Take 25 mg by mouth every 6 (six) hours as needed.    [provider]  fluticasone (FLONASE) 50 MCG/ACT nasal spray Place 1 spray into both nostrils in the morning and at bedtime. 12/31/20 12/31/21  Levie Heritage, DO  ibuprofen (ADVIL)  600 MG tablet Take 1 tablet (600 mg total) by mouth every 6 (six) hours. 01/12/21   Shirlean Mylar, MD  ipratropium-albuterol (DUONEB) 0.5-2.5 (3) MG/3ML SOLN Take 3 mLs by nebulization every 6 (six) hours. 04/14/20   Shahmehdi, Gemma Payor, MD  levocetirizine (XYZAL) 5 MG tablet Take 1 tablet (5 mg total) by mouth every evening. 02/28/22   Wallis Bamberg, PA-C  loratadine (CLARITIN) 10 MG tablet Take 1 tablet (10 mg total) by mouth daily. 04/14/20 04/14/21  Shahmehdi, Gemma Payor, MD  mometasone-formoterol (DULERA) 100-5 MCG/ACT AERO INHALE 2 PUFFS BY MOUTH TWICE DAILY. RINSE MOUTH AFTER EACH USE Patient taking differently: Inhale 2 puffs into the lungs in the morning and at bedtime. INHALE 2 PUFFS BY MOUTH TWICE DAILY. RINSE MOUTH AFTER EACH USE 04/14/20   Shahmehdi, Gemma Payor, MD  montelukast (SINGULAIR) 10 MG tablet Take 1 tablet (10 mg total) by mouth at bedtime. 04/14/20   Kendell Bane, MD  naproxen (NAPROSYN) 500 MG tablet Take 1 tablet (500 mg  total) by mouth 2 (two) times daily with a meal. 11/23/21   Wallis Bamberg, PA-C    Family History Family History  Problem Relation Age of Onset   Diabetes Mother    Hypertension Mother    Irritable bowel syndrome Mother    Diabetes Father    Cancer Father        basal cell cancer   Cancer Maternal Uncle        brain   Crohn's disease Maternal Aunt    Heart disease Maternal Grandmother    Heart disease Maternal Grandfather    Diabetes Maternal Grandfather     Social History Social History   Tobacco Use   Smoking status: Never   Smokeless tobacco: Never  Vaping Use   Vaping Use: Never used  Substance Use Topics   Alcohol use: Not Currently    Comment: occ   Drug use: No     Allergies   Benzyl alcohol, Cough syrup [guaifenesin], Haemophilus influenzae vaccines, Other, Petrolatum, and Iodine   Review of Systems Review of Systems Per HPI  Physical Exam Triage Vital Signs ED Triage Vitals  Enc Vitals Group     BP 10/24/22 1105 (!) 110/55     Pulse Rate 10/24/22 1105 100     Resp 10/24/22 1105 18     Temp 10/24/22 1105 97.9 F (36.6 C)     Temp Source 10/24/22 1105 Oral     SpO2 10/24/22 1105 95 %     Weight --      Height --      Head Circumference --      Peak Flow --      Pain Score 10/24/22 1106 5     Pain Loc --      Pain Edu? --      Excl. in GC? --    No data found.  Updated Vital Signs BP (!) 110/55 (BP Location: Right Arm)   Pulse 100   Temp 97.9 F (36.6 C) (Oral)   Resp 18   LMP  (Within Months) Comment: 1 month  SpO2 95%   Visual Acuity Right Eye Distance:   Left Eye Distance:   Bilateral Distance:    Right Eye Near:   Left Eye Near:    Bilateral Near:     Physical Exam Vitals and nursing note reviewed.  Constitutional:      General: She is not in acute distress.    Appearance: Normal appearance. She is  not toxic-appearing.  HENT:     Head: Normocephalic and atraumatic.     Right Ear: Decreased hearing noted. Tenderness  present. No drainage or swelling. Tympanic membrane is injected, erythematous and bulging. Tympanic membrane is not perforated.     Left Ear: Hearing, tympanic membrane, ear canal and external ear normal.     Mouth/Throat:     Mouth: Mucous membranes are moist.     Pharynx: Oropharynx is clear. No oropharyngeal exudate or posterior oropharyngeal erythema.  Pulmonary:     Effort: Pulmonary effort is normal. No respiratory distress.  Musculoskeletal:     Cervical back: Normal range of motion.  Lymphadenopathy:     Cervical: No cervical adenopathy.  Skin:    General: Skin is warm and dry.     Capillary Refill: Capillary refill takes less than 2 seconds.     Coloration: Skin is not jaundiced or pale.     Findings: No erythema.  Neurological:     Mental Status: She is alert and oriented to person, place, and time.  Psychiatric:        Behavior: Behavior is cooperative.      UC Treatments / Results  Labs (all labs ordered are listed, but only abnormal results are displayed) Labs Reviewed - No data to display  EKG   Radiology No results found.  Procedures Procedures (including critical care time)  Medications Ordered in UC Medications - No data to display  Initial Impression / Assessment and Plan / UC Course  I have reviewed the triage vital signs and the nursing notes.  Pertinent labs & imaging results that were available during my care of the patient were reviewed by me and considered in my medical decision making (see chart for details).   Patient is well-appearing, normotensive, afebrile, not tachycardic, not tachypneic, oxygenating well on room air.    Non-recurrent acute suppurative otitis media of right ear without spontaneous rupture of tympanic membrane Otalgia, right Treat with amoxicillin twice daily for 5 days Supportive care discussed - can continue Tylenol/ibuprofen as needed for pain Ear precautions discussed Recommended discontinuance of Q-tip use in the  ears  The patient was given the opportunity to ask questions.  All questions answered to their satisfaction.  The patient is in agreement to this plan.    Final Clinical Impressions(s) / UC Diagnoses   Final diagnoses:  Non-recurrent acute suppurative otitis media of right ear without spontaneous rupture of tympanic membrane  Otalgia, right     Discharge Instructions      You have an ear infection in your right ear.  Please take the amoxicillin to treat the infection.  Please stop using Q-tips.     ED Prescriptions     Medication Sig Dispense Auth. Provider   amoxicillin (AMOXIL) 875 MG tablet Take 1 tablet (875 mg total) by mouth 2 (two) times daily for 5 days. 10 tablet Eulogio Bear, NP      PDMP not reviewed this encounter.   Eulogio Bear, NP 10/24/22 [phone]

## 2022-10-24 NOTE — ED Triage Notes (Signed)
Pt reports she feels like right  rushing water and pain inside the ear and popping sounds x 1 day. Ibuprofen gives relief.

## 2022-10-24 NOTE — Discharge Instructions (Signed)
You have an ear infection in your right ear.  Please take the amoxicillin to treat the infection.  Please stop using Q-tips.

## 2022-10-27 ENCOUNTER — Ambulatory Visit
Admission: RE | Admit: 2022-10-27 | Discharge: 2022-10-27 | Disposition: A | Discharge status: Home / Self Care | Payer: Medicaid Other | Source: Ambulatory Visit | Attending: Family Medicine | Admitting: Family Medicine

## 2022-10-27 VITALS — BP 115/72 | HR 90 | Temp 98.6°F | Resp 20

## 2022-10-27 DIAGNOSIS — J3089 Other allergic rhinitis: Secondary | ICD-10-CM

## 2022-10-27 DIAGNOSIS — H6993 Unspecified Eustachian tube disorder, bilateral: Secondary | ICD-10-CM | POA: Diagnosis not present

## 2022-10-27 MED ORDER — PREDNISONE 50 MG PO TABS: ORAL_TABLET | ORAL | 0 refills | Status: DC

## 2022-10-27 MED ORDER — AZELASTINE HCL 0.1 % NA SOLN: 1.0000 | Freq: Two times a day (BID) | NASAL | 2 refills | Status: DC

## 2022-10-27 NOTE — ED Triage Notes (Signed)
Pt reports a low grade fever for 2 days with neon yellow flem. Right ear uncomfortable feels like  rushing water and pain inside the ear and popping sounds x 1 day.Took amoxicillin and ibuprofen but doesn't think it's working.

## 2022-10-27 NOTE — ED Provider Notes (Signed)
[facility]    CSN: [id number] Arrival date & time: 10/27/22  1051      History   Chief Complaint Chief Complaint  Patient presents with   Nasal Congestion    Have a ear infection, taking medication for it, but i have a fever now and Neon yellow mucus coming out of my noes. - Entered by patient    HPI Amy [name] is a 26 y.o. female.   Presenting today with ongoing ear pain, pressure, popping, fullness, thick nasal congestion for the past several days.  Came about 4 days ago and was diagnosed with a right ear infection and placed on amoxicillin which she states has not been helping much.  Takes Singulair for seasonal allergies, otherwise taking ibuprofen for pain.    Past Medical History:  Diagnosis Date   Asthma    Family history of adverse reaction to anesthesia    Migraines    Narcolepsy    PONV (postoperative nausea and vomiting)    Pregnancy 12/27/2019   PTSD (post-traumatic stress disorder)    Seasonal allergies     Patient Active Problem List   Diagnosis Date Noted   Stress incontinence 02/15/2021   Ovarian cyst 06/30/2020   PTSD (post-traumatic stress disorder) 06/30/2020   Anxiety 06/30/2020   History of laparoscopic cholecystectomy 06/21/2020   Asthma 02/25/2019   Eczema 02/25/2019    Past Surgical History:  Procedure Laterality Date   CHOLECYSTECTOMY N/A 06/20/2020   Procedure: LAPAROSCOPIC CHOLECYSTECTOMY;  Surgeon: Franky Macho, MD;  Location: [facility];  Service: General;  Laterality: N/A;   TONSILLECTOMY     WISDOM TOOTH EXTRACTION      OB History     Gravida  1   Para      Term      Preterm      AB      Living  1      SAB      IAB      Ectopic      Multiple      Live Births               Home Medications    Prior to Admission medications   Medication Sig Start Date End Date Taking? Authorizing Provider  azelastine (ASTELIN) 0.1 % nasal spray Place 1 spray into both nostrils 2 (two)  times daily. Use in each nostril as directed 10/27/22  Yes Particia Nearing, PA-C  predniSONE (DELTASONE) 50 MG tablet Take 1 tab daily x 3 days 10/27/22  Yes Particia Nearing, PA-C  acetaminophen (TYLENOL) 325 MG tablet Take 2 tablets (650 mg total) by mouth every 4 (four) hours as needed (for pain scale < 4). 01/12/21   Shirlean Mylar, MD  albuterol (VENTOLIN HFA) 108 (90 Base) MCG/ACT inhaler Inhale 1-2 puffs into the lungs every 6 (six) hours as needed for wheezing or shortness of breath. 02/28/22   Wallis Bamberg, PA-C  amoxicillin (AMOXIL) 875 MG tablet Take 1 tablet (875 mg total) by mouth 2 (two) times daily for 5 days. 10/24/22 10/29/22  Valentino Nose, NP  cetirizine (ZYRTEC) 10 MG tablet Take 1 tablet (10 mg total) by mouth daily. 11/19/21   Bing Neighbors, FNP  diphenhydrAMINE (BENADRYL) 25 MG tablet Take 25 mg by mouth every 6 (six) hours as needed.    [provider]  fluticasone (FLONASE) 50 MCG/ACT nasal spray Place 1 spray into both nostrils in the morning and at bedtime. 12/31/20 12/31/21  Truett Mainland, DO  ibuprofen (ADVIL) 600 MG tablet Take 1 tablet (600 mg total) by mouth every 6 (six) hours. 01/12/21   Gladys Damme, MD  ipratropium-albuterol (DUONEB) 0.5-2.5 (3) MG/3ML SOLN Take 3 mLs by nebulization every 6 (six) hours. 04/14/20   Shahmehdi, Valeria Batman, MD  levocetirizine (XYZAL) 5 MG tablet Take 1 tablet (5 mg total) by mouth every evening. 02/28/22   Jaynee Eagles, PA-C  loratadine (CLARITIN) 10 MG tablet Take 1 tablet (10 mg total) by mouth daily. 04/14/20 04/14/21  Shahmehdi, Valeria Batman, MD  mometasone-formoterol (DULERA) 100-5 MCG/ACT AERO INHALE 2 PUFFS BY MOUTH TWICE DAILY. RINSE MOUTH AFTER EACH USE Patient taking differently: Inhale 2 puffs into the lungs in the morning and at bedtime. INHALE 2 PUFFS BY MOUTH TWICE DAILY. RINSE MOUTH AFTER EACH USE 04/14/20   Shahmehdi, Valeria Batman, MD  montelukast (SINGULAIR) 10 MG tablet Take 1 tablet (10 mg total) by mouth at  bedtime. 04/14/20   Deatra James, MD  naproxen (NAPROSYN) 500 MG tablet Take 1 tablet (500 mg total) by mouth 2 (two) times daily with a meal. 11/23/21   Jaynee Eagles, PA-C    Family History Family History  Problem Relation Age of Onset   Diabetes Mother    Hypertension Mother    Irritable bowel syndrome Mother    Diabetes Father    Cancer Father        basal cell cancer   Cancer Maternal Uncle        brain   Crohn's disease Maternal Aunt    Heart disease Maternal Grandmother    Heart disease Maternal Grandfather    Diabetes Maternal Grandfather     Social History Social History   Tobacco Use   Smoking status: Never   Smokeless tobacco: Never  Vaping Use   Vaping Use: Never used  Substance Use Topics   Alcohol use: Not Currently    Comment: occ   Drug use: No     Allergies   Benzyl alcohol, Cough syrup [guaifenesin], Haemophilus influenzae vaccines, Other, Petrolatum, and Iodine   Review of Systems Review of Systems PER HPI  Physical Exam Triage Vital Signs ED Triage Vitals  Enc Vitals Group     BP 10/27/22 1108 115/72     Pulse Rate 10/27/22 1108 90     Resp 10/27/22 1108 20     Temp 10/27/22 1108 98.6 F (37 C)     Temp Source 10/27/22 1108 Oral     SpO2 10/27/22 1108 97 %     Weight --      Height --      Head Circumference --      Peak Flow --      Pain Score 10/27/22 1110 6     Pain Loc --      Pain Edu? --      Excl. in [place]? --    No data found.  Updated Vital Signs BP 115/72 (BP Location: Right Arm)   Pulse 90   Temp 98.6 F (37 C) (Oral)   Resp 20   LMP 10/26/2022 (Exact Date)   SpO2 97%   Visual Acuity Right Eye Distance:   Left Eye Distance:   Bilateral Distance:    Right Eye Near:   Left Eye Near:    Bilateral Near:     Physical Exam Vitals and nursing note reviewed.  Constitutional:      Appearance: Normal appearance.  HENT:     Head: Atraumatic.  Right Ear: External ear normal.     Left Ear: External ear  normal.     Ears:     Comments: Bilateral middle ear fusion    Nose: Rhinorrhea present.     Mouth/Throat:     Mouth: Mucous membranes are moist.     Pharynx: No posterior oropharyngeal erythema.  Eyes:     Extraocular Movements: Extraocular movements intact.     Conjunctiva/sclera: Conjunctivae normal.  Cardiovascular:     Rate and Rhythm: Normal rate and regular rhythm.     Heart sounds: Normal heart sounds.  Pulmonary:     Effort: Pulmonary effort is normal.     Breath sounds: Normal breath sounds. No wheezing or rhonchi.  Musculoskeletal:        General: Normal range of motion.     Cervical back: Normal range of motion and neck supple.  Skin:    General: Skin is warm and dry.  Neurological:     Mental Status: She is alert and oriented to person, place, and time.  Psychiatric:        Mood and Affect: Mood normal.        Thought Content: Thought content normal.      UC Treatments / Results  Labs (all labs ordered are listed, but only abnormal results are displayed) Labs Reviewed - No data to display  EKG   Radiology No results found.  Procedures Procedures (including critical care time)  Medications Ordered in UC Medications - No data to display  Initial Impression / Assessment and Plan / UC Course  I have reviewed the triage vital signs and the nursing notes.  Pertinent labs & imaging results that were available during my care of the patient were reviewed by me and considered in my medical decision making (see chart for details).     Complete Amoxil for recently diagnosed ear infection, add Astelin nasal spray, short course of prednisone to help with eustachian tube dysfunction.  Continue antihistamines daily.  Return for worsening symptoms.  Final Clinical Impressions(s) / UC Diagnoses   Final diagnoses:  Seasonal allergic rhinitis due to other allergic trigger  Eustachian tube dysfunction, bilateral   Discharge Instructions   None    ED  Prescriptions     Medication Sig Dispense Auth. Provider   azelastine (ASTELIN) 0.1 % nasal spray Place 1 spray into both nostrils 2 (two) times daily. Use in each nostril as directed 30 mL Particia Nearing, PA-C   predniSONE (DELTASONE) 50 MG tablet Take 1 tab daily x 3 days 3 tablet Particia Nearing, [place]      PDMP not reviewed this encounter.   Particia Nearing, [place] 10/27/22 1147

## 2022-12-29 ENCOUNTER — Ambulatory Visit
Admission: EM | Admit: 2022-12-29 | Discharge: 2022-12-29 | Disposition: A | Discharge status: Home / Self Care | Payer: Medicaid Other | Attending: Nurse Practitioner | Admitting: Nurse Practitioner

## 2022-12-29 ENCOUNTER — Encounter: Payer: Self-pay | Admitting: Emergency Medicine

## 2022-12-29 DIAGNOSIS — R200 Anesthesia of skin: Secondary | ICD-10-CM | POA: Diagnosis not present

## 2022-12-29 DIAGNOSIS — R202 Paresthesia of skin: Secondary | ICD-10-CM

## 2022-12-29 DIAGNOSIS — M542 Cervicalgia: Secondary | ICD-10-CM

## 2022-12-29 MED ORDER — METHOCARBAMOL 500 MG PO TABS: 500.0000 mg | ORAL_TABLET | Freq: Every evening | ORAL | 0 refills | Status: DC | PRN

## 2022-12-29 MED ORDER — PREDNISONE 50 MG PO TABS: ORAL_TABLET | ORAL | 0 refills | Status: DC

## 2022-12-29 NOTE — Discharge Instructions (Signed)
Take medication as prescribed. May take over-the-counter Tylenol while you are taking the prednisone.  Recommend arthritis strength Tylenol 650 mg tablets. May apply ice or heat.  Apply ice for pain or swelling, heat for spasm or stiffness.  Apply for 20 minutes, remove for 1 hour, then repeat is much as possible. Gentle stretching and range of motion exercises to help improve your mobility. If you experience worsening numbness, tingling, or become unable to move the right arm or your neck, please go to the emergency department immediately. As discussed, if symptoms do not improve with the treatment provided today, please follow-up with your primary care physician as scheduled for further evaluation. Follow-up as needed.

## 2022-12-29 NOTE — ED Triage Notes (Signed)
Pain to right arm since yesterday.  Unable to raise arm above head, feels numb.  Pain in right side of neck x 1 week.  No known injury.

## 2022-12-29 NOTE — ED Provider Notes (Signed)
[facility]    CSN: [id number] Arrival date & time: 12/29/22  1024      History   Chief Complaint No chief complaint on file.   HPI Amy [name] is a 27 y.o. female.   The history is provided by the patient.   Patient presents for complaints of right-sided neck pain that radiates into the right arm.  Symptoms have been present for the last 1 to 2 weeks.  Patient states over the last several days, she has had increasing pain in her neck and has since become unable to raise her arm above shoulder height.  He states that the pain starts in her right neck.  She states the top portion of her arm "feels numb" and that she cannot feel anything.  She states that she has not had any injury or trauma.  She denies numbness or tingling that radiates into the hand.  She has been taking ibuprofen for her symptoms.  Patient reports that she has been sleeping at a 45 degree angle for some time due to her asthma.  Past Medical History:  Diagnosis Date   Asthma    Family history of adverse reaction to anesthesia    Migraines    Narcolepsy    PONV (postoperative nausea and vomiting)    Pregnancy 12/27/2019   PTSD (post-traumatic stress disorder)    Seasonal allergies     Patient Active Problem List   Diagnosis Date Noted   Stress incontinence 02/15/2021   Ovarian cyst 06/30/2020   PTSD (post-traumatic stress disorder) 06/30/2020   Anxiety 06/30/2020   History of laparoscopic cholecystectomy 06/21/2020   Asthma 02/25/2019   Eczema 02/25/2019    Past Surgical History:  Procedure Laterality Date   CHOLECYSTECTOMY N/A 06/20/2020   Procedure: LAPAROSCOPIC CHOLECYSTECTOMY;  Surgeon: Franky Macho, MD;  Location: [facility];  Service: General;  Laterality: N/A;   TONSILLECTOMY     WISDOM TOOTH EXTRACTION      OB History     Gravida  1   Para      Term      Preterm      AB      Living  1      SAB      IAB      Ectopic      Multiple      Live  Births               Home Medications    Prior to Admission medications   Medication Sig Start Date End Date Taking? Authorizing Provider  methocarbamol (ROBAXIN) 500 MG tablet Take 1 tablet (500 mg total) by mouth at bedtime as needed for muscle spasms. 12/29/22  Yes Rogina Schiano-Warren, Sadie Haber, NP  predniSONE (DELTASONE) 50 MG tablet Take 1 tablet daily with breakfast for the next 5 days. 12/29/22  Yes Brittne Kawasaki-Warren, Sadie Haber, NP  acetaminophen (TYLENOL) 325 MG tablet Take 2 tablets (650 mg total) by mouth every 4 (four) hours as needed (for pain scale < 4). 01/12/21   Shirlean Mylar, MD  albuterol (VENTOLIN HFA) 108 (90 Base) MCG/ACT inhaler Inhale 1-2 puffs into the lungs every 6 (six) hours as needed for wheezing or shortness of breath. 02/28/22   Wallis Bamberg, PA-C  azelastine (ASTELIN) 0.1 % nasal spray Place 1 spray into both nostrils 2 (two) times daily. Use in each nostril as directed 10/27/22   Particia Nearing, PA-C  cetirizine (ZYRTEC) 10 MG tablet Take 1 tablet (10 mg total)  by mouth daily. 11/19/21   Scot Jun, FNP  diphenhydrAMINE (BENADRYL) 25 MG tablet Take 25 mg by mouth every 6 (six) hours as needed.    [provider]  fluticasone (FLONASE) 50 MCG/ACT nasal spray Place 1 spray into both nostrils in the morning and at bedtime. 12/31/20 12/31/21  Truett Mainland, DO  ibuprofen (ADVIL) 600 MG tablet Take 1 tablet (600 mg total) by mouth every 6 (six) hours. 01/12/21   Gladys Damme, MD  ipratropium-albuterol (DUONEB) 0.5-2.5 (3) MG/3ML SOLN Take 3 mLs by nebulization every 6 (six) hours. 04/14/20   Shahmehdi, Valeria Batman, MD  levocetirizine (XYZAL) 5 MG tablet Take 1 tablet (5 mg total) by mouth every evening. 02/28/22   Jaynee Eagles, PA-C  loratadine (CLARITIN) 10 MG tablet Take 1 tablet (10 mg total) by mouth daily. 04/14/20 04/14/21  Shahmehdi, Valeria Batman, MD  mometasone-formoterol (DULERA) 100-5 MCG/ACT AERO INHALE 2 PUFFS BY MOUTH TWICE DAILY. RINSE MOUTH AFTER EACH  USE Patient taking differently: Inhale 2 puffs into the lungs in the morning and at bedtime. INHALE 2 PUFFS BY MOUTH TWICE DAILY. RINSE MOUTH AFTER EACH USE 04/14/20   Shahmehdi, Valeria Batman, MD  montelukast (SINGULAIR) 10 MG tablet Take 1 tablet (10 mg total) by mouth at bedtime. 04/14/20   Deatra James, MD  naproxen (NAPROSYN) 500 MG tablet Take 1 tablet (500 mg total) by mouth 2 (two) times daily with a meal. 11/23/21   Jaynee Eagles, PA-C    Family History Family History  Problem Relation Age of Onset   Diabetes Mother    Hypertension Mother    Irritable bowel syndrome Mother    Diabetes Father    Cancer Father        basal cell cancer   Cancer Maternal Uncle        brain   Crohn's disease Maternal Aunt    Heart disease Maternal Grandmother    Heart disease Maternal Grandfather    Diabetes Maternal Grandfather     Social History Social History   Tobacco Use   Smoking status: Never   Smokeless tobacco: Never  Vaping Use   Vaping Use: Never used  Substance Use Topics   Alcohol use: Not Currently    Comment: occ   Drug use: No     Allergies   Benzyl alcohol, Cough syrup [guaifenesin], Haemophilus influenzae vaccines, Other, Petrolatum, and Iodine   Review of Systems Review of Systems Per HPI  Physical Exam Triage Vital Signs ED Triage Vitals  Enc Vitals Group     BP 12/29/22 1030 119/79     Pulse Rate 12/29/22 1030 82     Resp 12/29/22 1030 18     Temp 12/29/22 1030 97.8 F (36.6 C)     Temp Source 12/29/22 1030 Oral     SpO2 12/29/22 1030 96 %     Weight --      Height --      Head Circumference --      Peak Flow --      Pain Score 12/29/22 1032 6     Pain Loc --      Pain Edu? --      Excl. in [place]? --    No data found.  Updated Vital Signs BP 119/79 (BP Location: Right Arm)   Pulse 82   Temp 97.8 F (36.6 C) (Oral)   Resp 18   LMP 12/28/2022 (Exact Date)   SpO2 96%   Visual Acuity Right Eye Distance:  Left Eye Distance:   Bilateral  Distance:    Right Eye Near:   Left Eye Near:    Bilateral Near:     Physical Exam Vitals and nursing note reviewed.  Constitutional:      General: She is not in acute distress.    Appearance: Normal appearance.  Eyes:     Extraocular Movements: Extraocular movements intact.     Pupils: Pupils are equal, round, and reactive to light.  Cardiovascular:     Rate and Rhythm: Normal rate and regular rhythm.     Pulses: Normal pulses.     Heart sounds: Normal heart sounds.  Pulmonary:     Effort: Pulmonary effort is normal.     Breath sounds: Normal breath sounds.  Musculoskeletal:     Right shoulder: Tenderness (Tenderness noted to the lateral, posterior, and anterior aspect of the right shoulder.) present. No swelling or deformity. Decreased range of motion (Creased range of motion with flexion and extension of the right arm.  Pain with active range of motion at the shoulder height.). Normal strength. Normal pulse.     Right upper arm: Swelling present. No edema or tenderness.     Cervical back: Pain with movement and muscular tenderness present. Decreased range of motion.  Lymphadenopathy:     Cervical: No cervical adenopathy.  Skin:    General: Skin is warm and dry.  Neurological:     General: No focal deficit present.     Mental Status: She is alert and oriented to person, place, and time.  Psychiatric:        Mood and Affect: Mood normal.        Behavior: Behavior normal.      UC Treatments / Results  Labs (all labs ordered are listed, but only abnormal results are displayed) Labs Reviewed - No data to display  EKG   Radiology No results found.  Procedures Procedures (including critical care time)  Medications Ordered in UC Medications - No data to display  Initial Impression / Assessment and Plan / UC Course  I have reviewed the triage vital signs and the nursing notes.  Pertinent labs & imaging results that were available during my care of the patient were  reviewed by me and considered in my medical decision making (see chart for details).  Patient is well-appearing, she is in no acute distress, vital signs are stable.  Symptoms consistent with cervicalgia/cervical radiculopathy, most likely caused by nerves that may be causing patient to experience paresthesia in the right upper extremity.  Will start patient on prednisone 50 mg for the next 5 days, along with methocarbamol 500 mg at bedtime to help relax the muscles of her neck.  Supportive care recommendations were provided to the patient to include the use of ice or heat, use of Tylenol for pain or discomfort, and gentle stretching and range of motion exercises.  Patient is scheduled to follow-up with her primary care physician next week.  Patient was given strict ER precautions.  Patient verbalizes understanding.  All questions were answered.  Patient stable for discharge.   Final Clinical Impressions(s) / UC Diagnoses   Final diagnoses:  Cervicalgia  Numbness and tingling of right arm     Discharge Instructions      Take medication as prescribed. May take over-the-counter Tylenol while you are taking the prednisone.  Recommend arthritis strength Tylenol 650 mg tablets. May apply ice or heat.  Apply ice for pain or swelling, heat for spasm or stiffness.  Apply for 20 minutes, remove for 1 hour, then repeat is much as possible. Gentle stretching and range of motion exercises to help improve your mobility. If you experience worsening numbness, tingling, or become unable to move the right arm or your neck, please go to the emergency department immediately. As discussed, if symptoms do not improve with the treatment provided today, please follow-up with your primary care physician as scheduled for further evaluation. Follow-up as needed.     ED Prescriptions     Medication Sig Dispense Auth. Provider   predniSONE (DELTASONE) 50 MG tablet Take 1 tablet daily with breakfast for the next  5 days. 5 tablet Monigue Spraggins-Warren, Sadie Haber, NP   methocarbamol (ROBAXIN) 500 MG tablet Take 1 tablet (500 mg total) by mouth at bedtime as needed for muscle spasms. 20 tablet Tresia Revolorio-Warren, Sadie Haber, NP      PDMP not reviewed this encounter.   Abran Cantor, NP 12/29/22 1057

## 2023-01-04 ENCOUNTER — Ambulatory Visit (INDEPENDENT_AMBULATORY_CARE_PROVIDER_SITE_OTHER): Payer: Medicaid Other | Admitting: Family Medicine

## 2023-01-04 ENCOUNTER — Encounter: Payer: Self-pay | Admitting: Family Medicine

## 2023-01-04 VITALS — BP 108/72 | HR 68 | Ht 64.0 in | Wt 198.0 lb

## 2023-01-04 DIAGNOSIS — M542 Cervicalgia: Secondary | ICD-10-CM | POA: Diagnosis not present

## 2023-01-04 MED ORDER — METHOCARBAMOL 500 MG PO TABS: 500.0000 mg | ORAL_TABLET | Freq: Two times a day (BID) | ORAL | 0 refills | Status: DC

## 2023-01-04 NOTE — Assessment & Plan Note (Addendum)
Flexion of the neck cause shooting pain rigidity. Extension of the neck caused aching sensation Decreased range of motion of the neck Decrease range of motion of the right arm, weakness noted on the right arm Patient coordination and gait normal  Explained patient to visit the emergency room due to the severity of symptoms. Referral placed to neurology

## 2023-01-04 NOTE — Patient Instructions (Signed)
Please take Medications as ordered If symptoms get worse please go to the Emergency room Follow up with MRI

## 2023-01-04 NOTE — Progress Notes (Signed)
Acute Office Visit   Subjective   Patient ID: Amy [name], female    DOB: 1996/03/28  Age: 27 y.o. MRN: [id number]  CC:  Chief Complaint  Patient presents with   Establish Care   Arm Pain    Patient complains of R arm pain, numbness starting at the base of her neck for several weeks.     HPI Amy [name] presents to the clinic new onset of acute neck pain She  has a past medical history of Asthma, Family history of adverse reaction to anesthesia, Migraines, Narcolepsy, PONV (postoperative nausea and vomiting), Pregnancy (12/27/2019), PTSD (post-traumatic stress disorder), and Seasonal allergies.  [name] complains of neck pain. Onset of symptoms two weeks ago, gradually worsening since that time.Current symptoms are lower extremity weakness numbness on right arm and pain on her right clavicle.Patient has had no prior neck problems.  Previous treatments include methocarbamol (ROBAXIN) 500 MG tablet, prednisone, tylenol with moderate relief.Patient denies neck trauma, Patient reported having bowel dysfunction for years.  Neck Pain  This is a new problem. The current episode started 1 to 4 weeks ago. The problem occurs constantly. The problem has been gradually worsening. Pain location: posterior neck. The quality of the pain is described as aching, burning, shooting and stabbing (numbness, stiffness,). The pain is at a severity of 8/10. The pain is moderate. The symptoms are aggravated by position, bending and twisting. The pain is Worse during the day. Stiffness is present All day. Associated symptoms include numbness, paresis, tingling and weakness. Pertinent negatives include no headaches. She has tried NSAIDs and muscle relaxants for the symptoms. The treatment provided mild relief.      Outpatient Encounter Medications as of 01/04/2023  Medication Sig   albuterol (VENTOLIN HFA) 108 (90 Base) MCG/ACT inhaler Inhale 1-2 puffs into the lungs every 6 (six)  hours as needed for wheezing or shortness of breath.   azelastine (ASTELIN) 0.1 % nasal spray Place 1 spray into both nostrils 2 (two) times daily. Use in each nostril as directed   ipratropium-albuterol (DUONEB) 0.5-2.5 (3) MG/3ML SOLN Take 3 mLs by nebulization every 6 (six) hours.   levocetirizine (XYZAL) 5 MG tablet Take 1 tablet (5 mg total) by mouth every evening.   methocarbamol (ROBAXIN) 500 MG tablet Take 1 tablet (500 mg total) by mouth at bedtime as needed for muscle spasms.   methocarbamol (ROBAXIN) 500 MG tablet Take 1 tablet (500 mg total) by mouth 2 (two) times daily.   montelukast (SINGULAIR) 10 MG tablet Take 1 tablet (10 mg total) by mouth at bedtime.   naproxen (NAPROSYN) 500 MG tablet Take 1 tablet (500 mg total) by mouth 2 (two) times daily with a meal.   predniSONE (DELTASONE) 50 MG tablet Take 1 tablet daily with breakfast for the next 5 days.   acetaminophen (TYLENOL) 325 MG tablet Take 2 tablets (650 mg total) by mouth every 4 (four) hours as needed (for pain scale < 4).   cetirizine (ZYRTEC) 10 MG tablet Take 1 tablet (10 mg total) by mouth daily.   diphenhydrAMINE (BENADRYL) 25 MG tablet Take 25 mg by mouth every 6 (six) hours as needed.   fluticasone (FLONASE) 50 MCG/ACT nasal spray Place 1 spray into both nostrils in the morning and at bedtime.   ibuprofen (ADVIL) 600 MG tablet Take 1 tablet (600 mg total) by mouth every 6 (six) hours.   loratadine (CLARITIN) 10 MG tablet Take 1 tablet (10 mg total) by mouth daily.  mometasone-formoterol (DULERA) 100-5 MCG/ACT AERO INHALE 2 PUFFS BY MOUTH TWICE DAILY. RINSE MOUTH AFTER EACH USE (Patient taking differently: Inhale 2 puffs into the lungs in the morning and at bedtime. INHALE 2 PUFFS BY MOUTH TWICE DAILY. RINSE MOUTH AFTER EACH USE)   No facility-administered encounter medications on file as of 01/04/2023.    Past Surgical History:  Procedure Laterality Date   CHOLECYSTECTOMY N/A 06/20/2020   Procedure: LAPAROSCOPIC  CHOLECYSTECTOMY;  Surgeon: Aviva Signs, MD;  Location: [facility];  Service: General;  Laterality: N/A;   TONSILLECTOMY     WISDOM TOOTH EXTRACTION      Review of Systems  Musculoskeletal:  Positive for neck pain.  Neurological:  Positive for tingling, focal weakness, weakness and numbness. Negative for dizziness, tremors, speech change, seizures, loss of consciousness and headaches.      Objective    BP 108/72   Pulse 68   Ht 5\' 4"  (1.626 m)   Wt 198 lb (89.8 kg)   LMP 12/28/2022 (Exact Date)   SpO2 96%   BMI 33.99 kg/m   Physical Exam Constitutional:      General: She is not in acute distress.    Appearance: Normal appearance.  HENT:     Head: Normocephalic.     Nose: Nose normal.  Neck:     Thyroid: No thyroid mass.     Vascular: No carotid bruit.     Trachea: Trachea normal.  Cardiovascular:     Rate and Rhythm: Normal rate.     Pulses: Normal pulses.  Pulmonary:     Effort: Pulmonary effort is normal.     Breath sounds: Normal breath sounds.  Musculoskeletal:     Right upper arm: Swelling present. No deformity.     Left upper arm: Normal.     Cervical back: Rigidity and tenderness present. No edema, erythema or signs of trauma. Pain with movement and muscular tenderness present. Decreased range of motion.  Skin:    General: Skin is warm and dry.  Neurological:     Mental Status: She is alert.     Sensory: No sensory deficit.     Motor: Weakness present.     Coordination: Coordination normal.     Gait: Gait normal.  Psychiatric:        Mood and Affect: Mood normal.       Assessment & Plan:  Acute neck pain Assessment & Plan: Flexion of the neck cause shooting pain rigidity. Extension of the neck caused aching sensation sensation Decreased range of motion of the neck Decrease range of motion of the right arm, weakness noted on the right arm Patient coordination and gait normal  Explained patient to visit the emergency room due to the severity of  symptoms. Referral placed to neurology  Orders: -     Ambulatory referral to Neurology -     [facility]; Future -     Methocarbamol; Take 1 tablet (500 mg total) by mouth 2 (two) times daily.  Dispense: 10 tablet; Refill: 0    No follow-ups on file.   Renard Hamper Ria Comment, FNP

## 2023-01-11 ENCOUNTER — Ambulatory Visit (HOSPITAL_BASED_OUTPATIENT_CLINIC_OR_DEPARTMENT_OTHER): Payer: Medicaid Other

## 2023-01-21 ENCOUNTER — Telehealth: Payer: Self-pay | Admitting: Family Medicine

## 2023-01-21 NOTE — Telephone Encounter (Signed)
Patient needs to make an appointment, last visit was focused on neck pain only

## 2023-01-21 NOTE — Telephone Encounter (Signed)
Appt scheduled

## 2023-01-21 NOTE — Telephone Encounter (Signed)
Prescription Request  01/21/2023  Is this a "Controlled Substance" medicine? No  LOV: 01/04/2023  What is the name of the medication or equipment? albuterol (VENTOLIN HFA) 108 (90 Base) MCG/ACT inhaler [[id number]]   mometasone-formoterol (DULERA) 100-5 MCG/ACT AERO [[id number]   loratadine (CLARITIN) 10 MG tablet [[id number]    Have you contacted your pharmacy to request a refill? No   Which pharmacy would you like this sent to?  [place], [place] [address] Phone: [phone] Fax: [phone]    Patient notified that their request is being sent to the clinical staff for review and that they should receive a response within 2 business days.   Please advise at Mobile [phone] (mobile)

## 2023-01-22 ENCOUNTER — Ambulatory Visit (HOSPITAL_COMMUNITY)
Admission: RE | Admit: 2023-01-22 | Discharge: 2023-01-22 | Disposition: A | Discharge status: Home / Self Care | Payer: Medicaid Other | Source: Ambulatory Visit | Attending: Family Medicine | Admitting: Family Medicine

## 2023-01-22 DIAGNOSIS — M542 Cervicalgia: Secondary | ICD-10-CM | POA: Diagnosis present

## 2023-01-31 ENCOUNTER — Encounter: Payer: Self-pay | Admitting: Family Medicine

## 2023-01-31 ENCOUNTER — Ambulatory Visit (INDEPENDENT_AMBULATORY_CARE_PROVIDER_SITE_OTHER): Payer: Medicaid Other | Admitting: Family Medicine

## 2023-01-31 VITALS — BP 118/80 | HR 82 | Ht 64.0 in | Wt 197.0 lb

## 2023-01-31 DIAGNOSIS — J4520 Mild intermittent asthma, uncomplicated: Secondary | ICD-10-CM

## 2023-01-31 DIAGNOSIS — M542 Cervicalgia: Secondary | ICD-10-CM | POA: Diagnosis not present

## 2023-01-31 DIAGNOSIS — Z23 Encounter for immunization: Secondary | ICD-10-CM

## 2023-01-31 DIAGNOSIS — N898 Other specified noninflammatory disorders of vagina: Secondary | ICD-10-CM

## 2023-01-31 MED ORDER — DULERA 100-5 MCG/ACT IN AERO: INHALATION_SPRAY | RESPIRATORY_TRACT | 4 refills | Status: DC

## 2023-01-31 MED ORDER — ALBUTEROL SULFATE HFA 108 (90 BASE) MCG/ACT IN AERS: 1.0000 | INHALATION_SPRAY | Freq: Four times a day (QID) | RESPIRATORY_TRACT | 6 refills | Status: DC | PRN

## 2023-01-31 MED ORDER — MONTELUKAST SODIUM 10 MG PO TABS: 10.0000 mg | ORAL_TABLET | Freq: Every day | ORAL | 3 refills | Status: DC

## 2023-01-31 NOTE — Patient Instructions (Signed)
It was a pleasure meeting with you today Please follow up in 2 weeks for your pap smear

## 2023-01-31 NOTE — Progress Notes (Signed)
Patient Office Visit   Subjective   Patient ID: Amy [name], female    DOB: [birth year]  Age: 27 y.o. MRN: [id number]  CC:  Chief Complaint  Patient presents with   Neck Pain    Patient is here for f/u of neck pain. Had MRI, wants to go over results and get med refills.     HPI Amy [name] presents to clinic follow up neck pain and vaginal discharge. She  has a past medical history of Asthma, Family history of adverse reaction to anesthesia, Migraines, Narcolepsy, PONV (postoperative nausea and vomiting), Pregnancy (12/27/2019), PTSD (post-traumatic stress disorder), and Seasonal allergies.  Patient complains of an abnormal vaginal discharge for a few months. Vaginal symptoms include discharge described as green, odor, and vulvar itching.STI Risk: Possible STD exposure. Discharge described as copious, green, thin, and mucoid. Patient reports has a regular menstrual pattern has been bleeding regularly. Currently not sexually active and does not use contraception     Neck Pain  This is a recurrent problem. The current episode started more than 1 month ago. The problem occurs intermittently. The problem has been unchanged. The quality of the pain is described as aching. The pain is at a severity of 6/10. The pain is mild. Exacerbated by: flexion. The pain is Same all the time. Stiffness is present All day. Pertinent negatives include no chest pain, headaches, leg pain, numbness, pain with swallowing, paresis, syncope, trouble swallowing, visual change or weakness. She has tried acetaminophen, ice and NSAIDs for the symptoms. The treatment provided moderate relief.    Outpatient Encounter Medications as of 01/31/2023  Medication Sig   cetirizine (ZYRTEC) 10 MG tablet Take 1 tablet (10 mg total) by mouth daily.   diphenhydrAMINE (BENADRYL) 25 MG tablet Take 25 mg by mouth every 6 (six) hours as needed.   ipratropium-albuterol (DUONEB) 0.5-2.5 (3) MG/3ML SOLN Take 3  mLs by nebulization every 6 (six) hours.   [DISCONTINUED] albuterol (VENTOLIN HFA) 108 (90 Base) MCG/ACT inhaler Inhale 1-2 puffs into the lungs every 6 (six) hours as needed for wheezing or shortness of breath.   [DISCONTINUED] mometasone-formoterol (DULERA) 100-5 MCG/ACT AERO INHALE 2 PUFFS BY MOUTH TWICE DAILY. RINSE MOUTH AFTER EACH USE (Patient taking differently: Inhale 2 puffs into the lungs in the morning and at bedtime. INHALE 2 PUFFS BY MOUTH TWICE DAILY. RINSE MOUTH AFTER EACH USE)   [DISCONTINUED] montelukast (SINGULAIR) 10 MG tablet Take 1 tablet (10 mg total) by mouth at bedtime.   acetaminophen (TYLENOL) 325 MG tablet Take 2 tablets (650 mg total) by mouth every 4 (four) hours as needed (for pain scale < 4).   albuterol (VENTOLIN HFA) 108 (90 Base) MCG/ACT inhaler Inhale 1-2 puffs into the lungs every 6 (six) hours as needed for wheezing or shortness of breath.   azelastine (ASTELIN) 0.1 % nasal spray Place 1 spray into both nostrils 2 (two) times daily. Use in each nostril as directed   fluticasone (FLONASE) 50 MCG/ACT nasal spray Place 1 spray into both nostrils in the morning and at bedtime.   ibuprofen (ADVIL) 600 MG tablet Take 1 tablet (600 mg total) by mouth every 6 (six) hours.   levocetirizine (XYZAL) 5 MG tablet Take 1 tablet (5 mg total) by mouth every evening.   loratadine (CLARITIN) 10 MG tablet Take 1 tablet (10 mg total) by mouth daily.   methocarbamol (ROBAXIN) 500 MG tablet Take 1 tablet (500 mg total) by mouth at bedtime as needed for muscle spasms.  methocarbamol (ROBAXIN) 500 MG tablet Take 1 tablet (500 mg total) by mouth 2 (two) times daily. (Patient not taking: Reported on 01/31/2023)   mometasone-formoterol (DULERA) 100-5 MCG/ACT AERO INHALE 2 PUFFS BY MOUTH TWICE DAILY. RINSE MOUTH AFTER EACH USE   montelukast (SINGULAIR) 10 MG tablet Take 1 tablet (10 mg total) by mouth at bedtime.   naproxen (NAPROSYN) 500 MG tablet Take 1 tablet (500 mg total) by mouth 2 (two)  times daily with a meal.   predniSONE (DELTASONE) 50 MG tablet Take 1 tablet daily with breakfast for the next 5 days.   No facility-administered encounter medications on file as of 01/31/2023.    Past Surgical History:  Procedure Laterality Date   CHOLECYSTECTOMY N/A 06/20/2020   Procedure: LAPAROSCOPIC CHOLECYSTECTOMY;  Surgeon: Aviva Signs, MD;  Location: [facility];  Service: General;  Laterality: N/A;   TONSILLECTOMY     WISDOM TOOTH EXTRACTION      Review of Systems  HENT:  Negative for trouble swallowing.   Cardiovascular:  Negative for chest pain and syncope.  Genitourinary:  Negative for dysuria, flank pain, frequency, hematuria and urgency.  Musculoskeletal:  Positive for neck pain.  Neurological:  Negative for weakness, numbness and headaches.      Objective    BP 118/80   Pulse 82   Ht 5' 4"$  (1.626 m)   Wt 197 lb (89.4 kg)   SpO2 94%   BMI 33.81 kg/m   Physical Exam Cardiovascular:     Rate and Rhythm: Normal rate and regular rhythm.     Pulses: Normal pulses.  Pulmonary:     Effort: Pulmonary effort is normal.     Breath sounds: Normal breath sounds.  Musculoskeletal:        General: Normal range of motion.     Cervical back: Normal range of motion. No rigidity.  Lymphadenopathy:     Cervical: No cervical adenopathy.  Skin:    General: Skin is warm and dry.     Capillary Refill: Capillary refill takes less than 2 seconds.  Neurological:     General: No focal deficit present.     Mental Status: She is alert.     Motor: No weakness.     Coordination: Coordination normal.     Gait: Gait normal.  Psychiatric:        Mood and Affect: Mood normal.       Assessment & Plan:  Neck pain Assessment & Plan: Explained to patient Non pharmacological interventions include the use of ice or heat, rest, recommend range of motion exercises, gentle stretching. The use of NSAID for pain management.  Follow up for worsening or persistent symptoms. Patient verbalizes  understanding regarding plan of care and all questions answered.    Need for Tdap vaccination -     Tdap vaccine greater than or equal to 7yo IM  Mild intermittent asthma without complication -     Montelukast Sodium; Take 1 tablet (10 mg total) by mouth at bedtime.  Dispense: 30 tablet; Refill: 3 -     Albuterol Sulfate HFA; Inhale 1-2 puffs into the lungs every 6 (six) hours as needed for wheezing or shortness of breath.  Dispense: 18 g; Refill: 6 -     Dulera; INHALE 2 PUFFS BY MOUTH TWICE DAILY. RINSE MOUTH AFTER EACH USE  Dispense: 39 g; Refill: 4  Vaginal discharge Assessment & Plan: STI screening ordered placed Urine pregnancy ordered placed Awaiting results  Orders: -     NuSwab Vaginitis  Plus (VG+) -     POCT urine pregnancy    Return in about 2 weeks (around 02/14/2023) for Pap smear.   Renard Hamper Ria Comment, FNP

## 2023-02-02 ENCOUNTER — Other Ambulatory Visit: Payer: Self-pay | Admitting: Family Medicine

## 2023-02-02 DIAGNOSIS — N898 Other specified noninflammatory disorders of vagina: Secondary | ICD-10-CM | POA: Insufficient documentation

## 2023-02-02 DIAGNOSIS — M542 Cervicalgia: Secondary | ICD-10-CM | POA: Insufficient documentation

## 2023-02-02 DIAGNOSIS — Z3200 Encounter for pregnancy test, result unknown: Secondary | ICD-10-CM

## 2023-02-02 NOTE — Assessment & Plan Note (Addendum)
STI screening ordered placed Urine pregnancy ordered placed Awaiting results

## 2023-02-02 NOTE — Assessment & Plan Note (Addendum)
Explained to patient Non pharmacological interventions include the use of ice or heat, rest, recommend range of motion exercises, gentle stretching. The use of NSAID for pain management.  Follow up for worsening or persistent symptoms. Patient verbalizes understanding regarding plan of care and all questions answered.

## 2023-02-05 ENCOUNTER — Other Ambulatory Visit: Payer: Self-pay | Admitting: Family Medicine

## 2023-02-05 DIAGNOSIS — A599 Trichomoniasis, unspecified: Secondary | ICD-10-CM

## 2023-02-05 LAB — NUSWAB VAGINITIS PLUS (VG+)
Atopobium vaginae: HIGH Score — AB
BVAB 2: HIGH Score — AB
Candida albicans, NAA: NEGATIVE
Candida glabrata, NAA: NEGATIVE
Chlamydia trachomatis, NAA: NEGATIVE
Neisseria gonorrhoeae, NAA: NEGATIVE
Trich vag by NAA: POSITIVE — AB

## 2023-02-05 MED ORDER — METRONIDAZOLE 500 MG PO TABS: 500.0000 mg | ORAL_TABLET | Freq: Two times a day (BID) | ORAL | 0 refills | Status: AC

## 2023-02-19 ENCOUNTER — Other Ambulatory Visit (HOSPITAL_COMMUNITY)
Admission: RE | Admit: 2023-02-19 | Discharge: 2023-02-19 | Disposition: A | Discharge status: Home / Self Care | Payer: Medicaid Other | Source: Ambulatory Visit | Attending: Family Medicine | Admitting: Family Medicine

## 2023-02-19 ENCOUNTER — Ambulatory Visit (INDEPENDENT_AMBULATORY_CARE_PROVIDER_SITE_OTHER): Payer: Medicaid Other | Admitting: Family Medicine

## 2023-02-19 ENCOUNTER — Encounter: Payer: Self-pay | Admitting: Family Medicine

## 2023-02-19 VITALS — BP 120/67 | HR 103 | Ht 64.0 in | Wt 198.0 lb

## 2023-02-19 DIAGNOSIS — Z124 Encounter for screening for malignant neoplasm of cervix: Secondary | ICD-10-CM

## 2023-02-19 DIAGNOSIS — Z01419 Encounter for gynecological examination (general) (routine) without abnormal findings: Secondary | ICD-10-CM | POA: Diagnosis not present

## 2023-02-19 NOTE — Progress Notes (Signed)
New Patient Office Visit   Subjective   Patient ID: Amy [name], female    DOB: 24-Jun-1996  Age: 27 y.o. MRN: [id number]  CC: No chief complaint on file.   HPI Amy [name] 27 year old female, presents to the clinic for pap smear. She  has a past medical history of Asthma, Family history of adverse reaction to anesthesia, Migraines, Narcolepsy, PONV (postoperative nausea and vomiting), Pregnancy (12/27/2019), PTSD (post-traumatic stress disorder), and Seasonal allergies.  HPI   Outpatient Encounter Medications as of 02/19/2023  Medication Sig   albuterol (VENTOLIN HFA) 108 (90 Base) MCG/ACT inhaler Inhale 1-2 puffs into the lungs every 6 (six) hours as needed for wheezing or shortness of breath.   cetirizine (ZYRTEC) 10 MG tablet Take 1 tablet (10 mg total) by mouth daily.   diphenhydrAMINE (BENADRYL) 25 MG tablet Take 25 mg by mouth every 6 (six) hours as needed.   ipratropium-albuterol (DUONEB) 0.5-2.5 (3) MG/3ML SOLN Take 3 mLs by nebulization every 6 (six) hours.   methocarbamol (ROBAXIN) 500 MG tablet Take 1 tablet (500 mg total) by mouth 2 (two) times daily.   mometasone-formoterol (DULERA) 100-5 MCG/ACT AERO INHALE 2 PUFFS BY MOUTH TWICE DAILY. RINSE MOUTH AFTER EACH USE   montelukast (SINGULAIR) 10 MG tablet Take 1 tablet (10 mg total) by mouth at bedtime.   acetaminophen (TYLENOL) 325 MG tablet Take 2 tablets (650 mg total) by mouth every 4 (four) hours as needed (for pain scale < 4).   azelastine (ASTELIN) 0.1 % nasal spray Place 1 spray into both nostrils 2 (two) times daily. Use in each nostril as directed   fluticasone (FLONASE) 50 MCG/ACT nasal spray Place 1 spray into both nostrils in the morning and at bedtime.   ibuprofen (ADVIL) 600 MG tablet Take 1 tablet (600 mg total) by mouth every 6 (six) hours.   levocetirizine (XYZAL) 5 MG tablet Take 1 tablet (5 mg total) by mouth every evening.   loratadine (CLARITIN) 10 MG tablet Take 1 tablet  (10 mg total) by mouth daily.   methocarbamol (ROBAXIN) 500 MG tablet Take 1 tablet (500 mg total) by mouth at bedtime as needed for muscle spasms.   naproxen (NAPROSYN) 500 MG tablet Take 1 tablet (500 mg total) by mouth 2 (two) times daily with a meal.   predniSONE (DELTASONE) 50 MG tablet Take 1 tablet daily with breakfast for the next 5 days.   No facility-administered encounter medications on file as of 02/19/2023.    Past Surgical History:  Procedure Laterality Date   CHOLECYSTECTOMY N/A 06/20/2020   Procedure: LAPAROSCOPIC CHOLECYSTECTOMY;  Surgeon: Aviva Signs, MD;  Location: [facility];  Service: General;  Laterality: N/A;   TONSILLECTOMY     WISDOM TOOTH EXTRACTION      Review of Systems  Constitutional:  Negative for chills and fever.  Respiratory:  Negative for cough.   Cardiovascular:  Negative for chest pain.  Genitourinary:  Negative for dysuria, flank pain, frequency, hematuria and urgency.  Skin:  Negative for itching and rash.      Objective    BP 120/67   Pulse (!) 103   Ht '5\' 4"'$  (1.626 m)   Wt 198 lb (89.8 kg)   SpO2 94%   BMI 33.99 kg/m   Physical Exam Genitourinary:    General: Normal vulva.     Exam position: Lithotomy position.     Pubic Area: No rash.      Labia:  Right: No rash, tenderness, lesion or injury.        Left: No rash, tenderness, lesion or injury.      Urethra: No prolapse, urethral pain, urethral swelling or urethral lesion.       Assessment & Plan:  Pap smear for cervical cancer screening -     Cytology - PAP  Encounter for cervical Pap smear with pelvic exam Assessment & Plan: Pap smear done, Patient tolerated procedure well. Explain to patient I will update on results. Breasts appear normal, no suspicious masses, no skin or nipple changes or axillary nodes.     No follow-ups on file.   Renard Hamper Ria Comment, FNP

## 2023-02-19 NOTE — Assessment & Plan Note (Signed)
Pap smear done, Patient tolerated procedure well. Explain to patient I will update on results. Breasts appear normal, no suspicious masses, no skin or nipple changes or axillary nodes.

## 2023-02-19 NOTE — Patient Instructions (Signed)
It was pleasure meeting with you today. Follow up with your primary health provider if any health concerns arises.

## 2023-02-25 ENCOUNTER — Other Ambulatory Visit: Payer: Self-pay | Admitting: Family Medicine

## 2023-02-25 DIAGNOSIS — R87612 Low grade squamous intraepithelial lesion on cytologic smear of cervix (LGSIL): Secondary | ICD-10-CM

## 2023-03-12 LAB — CYTOLOGY - PAP
Adequacy: ABSENT
Comment: NEGATIVE
Comment: NEGATIVE
Comment: NEGATIVE
HPV 16: NEGATIVE
HPV 18 / 45: NEGATIVE
High risk HPV: POSITIVE — AB

## 2023-03-26 ENCOUNTER — Encounter: Payer: Self-pay | Admitting: Family Medicine

## 2023-03-26 ENCOUNTER — Ambulatory Visit (INDEPENDENT_AMBULATORY_CARE_PROVIDER_SITE_OTHER): Payer: Medicaid Other | Admitting: Family Medicine

## 2023-03-26 VITALS — BP 125/81 | HR 90 | Ht 64.0 in | Wt 199.0 lb

## 2023-03-26 DIAGNOSIS — J069 Acute upper respiratory infection, unspecified: Secondary | ICD-10-CM | POA: Diagnosis not present

## 2023-03-26 MED ORDER — IPRATROPIUM-ALBUTEROL 0.5-2.5 (3) MG/3ML IN SOLN: 3.0000 mL | Freq: Four times a day (QID) | RESPIRATORY_TRACT | 5 refills | Status: DC

## 2023-03-26 MED ORDER — AMOXICILLIN-POT CLAVULANATE 875-125 MG PO TABS: 1.0000 | ORAL_TABLET | Freq: Two times a day (BID) | ORAL | 0 refills | Status: AC

## 2023-03-26 MED ORDER — BENZONATATE 100 MG PO CAPS: 100.0000 mg | ORAL_CAPSULE | Freq: Two times a day (BID) | ORAL | 0 refills | Status: DC | PRN

## 2023-03-26 MED ORDER — AZELASTINE-FLUTICASONE 137-50 MCG/ACT NA SUSP: 1.0000 | Freq: Two times a day (BID) | NASAL | 1 refills | Status: DC

## 2023-03-26 MED ORDER — PREDNISONE 20 MG PO TABS: 20.0000 mg | ORAL_TABLET | Freq: Two times a day (BID) | ORAL | 0 refills | Status: AC

## 2023-03-26 NOTE — Patient Instructions (Signed)
It was pleasure meeting with you today. Please take medications as prescribed. Follow up with your primary health provider if any health concerns arises. If symptoms worsen please contact your primary care provider and/or visit the emergency department.  

## 2023-03-26 NOTE — Assessment & Plan Note (Signed)
Advise on Duoneb treatment every 6 hours. Prescribed Augmentin 875-125 mg x 7 days and Prednisone 20 mg x 5 days Discussed symptomatic treatment, rest, increase oral fluid intake. Take OTC tylenol for fever or pain medications. Follow-up for worsening or persistent symptoms. Patient verbalizes understanding regarding plan of care and all questions answered

## 2023-03-26 NOTE — Progress Notes (Signed)
Patient Office Visit   Subjective   Patient ID: Amy [name], female    DOB: 06/08/1996  Age: 27 y.o. MRN: [id number]  CC:  Chief Complaint  Patient presents with   Shortness of Breath    Patient complains of gasping for air at night, feeling like her asthma is worse, and productive cough for a week and a half.     HPI Amy [name] 27 year old female, presents to the clinic for shortness of breath and cough for the past several days. She  has a past medical history of Asthma, Family history of adverse reaction to anesthesia, Migraines, Narcolepsy, PONV (postoperative nausea and vomiting), Pregnancy (12/27/2019), PTSD (post-traumatic stress disorder), and Seasonal allergies.  Patient complains of cough. Patient describes symptoms of shortness of breath on exertion, chills without rigors, cough, fatigue, fever, headache, malaise, yellow sputum production, sweats, and wheezing. Symptoms began 7 days ago and are gradually worsening since that time. Patient denies chest pain or nausea and vomiting. Treatment thus far includes OTC analgesics/antipyretics and mucinex treatment has been ineffective Past pulmonary history is significant for asthma     Outpatient Encounter Medications as of 03/26/2023  Medication Sig   albuterol (VENTOLIN HFA) 108 (90 Base) MCG/ACT inhaler Inhale 1-2 puffs into the lungs every 6 (six) hours as needed for wheezing or shortness of breath.   amoxicillin-clavulanate (AUGMENTIN) 875-125 MG tablet Take 1 tablet by mouth 2 (two) times daily for 7 days.   Azelastine-Fluticasone 137-50 MCG/ACT SUSP Place 1 spray into the nose every 12 (twelve) hours.   benzonatate (TESSALON) 100 MG capsule Take 1 capsule (100 mg total) by mouth 2 (two) times daily as needed for cough.   cetirizine (ZYRTEC) 10 MG tablet Take 1 tablet (10 mg total) by mouth daily.   diphenhydrAMINE (BENADRYL) 25 MG tablet Take 25 mg by mouth every 6 (six) hours as needed.    methocarbamol (ROBAXIN) 500 MG tablet Take 1 tablet (500 mg total) by mouth 2 (two) times daily.   mometasone-formoterol (DULERA) 100-5 MCG/ACT AERO INHALE 2 PUFFS BY MOUTH TWICE DAILY. RINSE MOUTH AFTER EACH USE   montelukast (SINGULAIR) 10 MG tablet Take 1 tablet (10 mg total) by mouth at bedtime.   predniSONE (DELTASONE) 20 MG tablet Take 1 tablet (20 mg total) by mouth 2 (two) times daily with a meal for 5 days.   [DISCONTINUED] ipratropium-albuterol (DUONEB) 0.5-2.5 (3) MG/3ML SOLN Take 3 mLs by nebulization every 6 (six) hours.   ipratropium-albuterol (DUONEB) 0.5-2.5 (3) MG/3ML SOLN Take 3 mLs by nebulization every 6 (six) hours.   loratadine (CLARITIN) 10 MG tablet Take 1 tablet (10 mg total) by mouth daily.   [DISCONTINUED] acetaminophen (TYLENOL) 325 MG tablet Take 2 tablets (650 mg total) by mouth every 4 (four) hours as needed (for pain scale < 4).   [DISCONTINUED] azelastine (ASTELIN) 0.1 % nasal spray Place 1 spray into both nostrils 2 (two) times daily. Use in each nostril as directed   [DISCONTINUED] fluticasone (FLONASE) 50 MCG/ACT nasal spray Place 1 spray into both nostrils in the morning and at bedtime.   [DISCONTINUED] ibuprofen (ADVIL) 600 MG tablet Take 1 tablet (600 mg total) by mouth every 6 (six) hours.   [DISCONTINUED] levocetirizine (XYZAL) 5 MG tablet Take 1 tablet (5 mg total) by mouth every evening.   [DISCONTINUED] methocarbamol (ROBAXIN) 500 MG tablet Take 1 tablet (500 mg total) by mouth at bedtime as needed for muscle spasms.   [DISCONTINUED] naproxen (NAPROSYN) 500 MG  tablet Take 1 tablet (500 mg total) by mouth 2 (two) times daily with a meal.   [DISCONTINUED] predniSONE (DELTASONE) 50 MG tablet Take 1 tablet daily with breakfast for the next 5 days.   No facility-administered encounter medications on file as of 03/26/2023.    Past Surgical History:  Procedure Laterality Date   CHOLECYSTECTOMY N/A 06/20/2020   Procedure: LAPAROSCOPIC CHOLECYSTECTOMY;  Surgeon:  Aviva Signs, MD;  Location: [facility];  Service: General;  Laterality: N/A;   TONSILLECTOMY     WISDOM TOOTH EXTRACTION      Review of Systems  Constitutional:  Positive for chills, diaphoresis, fever and malaise/fatigue.  Eyes:  Negative for blurred vision.  Respiratory:  Positive for cough, sputum production, shortness of breath and wheezing.   Cardiovascular:  Negative for chest pain.  Gastrointestinal:  Negative for abdominal pain, diarrhea and vomiting.  Genitourinary:  Negative for dysuria.  Musculoskeletal:  Negative for myalgias.  Neurological:  Positive for headaches. Negative for dizziness.      Objective    BP 125/81   Pulse 90   Ht 5\' 4"  (1.626 m)   Wt 199 lb (90.3 kg)   SpO2 95%   BMI 34.16 kg/m   Physical Exam HENT:     Head: Normocephalic.     Right Ear: Tympanic membrane normal.     Left Ear: Tympanic membrane normal.  Cardiovascular:     Rate and Rhythm: Normal rate and regular rhythm.     Pulses: Normal pulses.  Pulmonary:     Effort: Pulmonary effort is normal.     Breath sounds: Wheezing and rhonchi present.  Musculoskeletal:        General: Normal range of motion.     Right lower leg: No edema.     Left lower leg: No edema.  Skin:    General: Skin is warm and dry.     Capillary Refill: Capillary refill takes less than 2 seconds.  Neurological:     General: No focal deficit present.     Mental Status: She is alert.     Coordination: Coordination normal.     Gait: Gait normal.  Psychiatric:        Thought Content: Thought content normal.       Assessment & Plan:  Upper respiratory tract infection, unspecified type Assessment & Plan: Advise on Duoneb treatment every 6 hours. Prescribed Augmentin 875-125 mg x 7 days and Prednisone 20 mg x 5 days Discussed symptomatic treatment, rest, increase oral fluid intake. Take OTC tylenol for fever or pain medications. Follow-up for worsening or persistent symptoms. Patient verbalizes understanding  regarding plan of care and all questions answered   Orders: -     Ipratropium-Albuterol; Take 3 mLs by nebulization every 6 (six) hours.  Dispense: 360 mL; Refill: 5 -     predniSONE; Take 1 tablet (20 mg total) by mouth 2 (two) times daily with a meal for 5 days.  Dispense: 10 tablet; Refill: 0 -     Amoxicillin-Pot Clavulanate; Take 1 tablet by mouth 2 (two) times daily for 7 days.  Dispense: 14 tablet; Refill: 0 -     Azelastine-Fluticasone; Place 1 spray into the nose every 12 (twelve) hours.  Dispense: 23 g; Refill: 1 -     Benzonatate; Take 1 capsule (100 mg total) by mouth 2 (two) times daily as needed for cough.  Dispense: 20 capsule; Refill: 0    No follow-ups on file.   Renard Hamper Ria Comment, FNP

## 2023-04-02 ENCOUNTER — Ambulatory Visit (INDEPENDENT_AMBULATORY_CARE_PROVIDER_SITE_OTHER): Payer: Medicaid Other

## 2023-04-02 ENCOUNTER — Ambulatory Visit
Admission: RE | Admit: 2023-04-02 | Discharge: 2023-04-02 | Disposition: A | Discharge status: Home / Self Care | Payer: Medicaid Other | Source: Ambulatory Visit | Attending: Nurse Practitioner | Admitting: Nurse Practitioner

## 2023-04-02 VITALS — BP 146/81 | HR 85 | Temp 97.8°F | Resp 18

## 2023-04-02 DIAGNOSIS — R059 Cough, unspecified: Secondary | ICD-10-CM

## 2023-04-02 DIAGNOSIS — J4521 Mild intermittent asthma with (acute) exacerbation: Secondary | ICD-10-CM

## 2023-04-02 MED ORDER — PREDNISONE 20 MG PO TABS: 40.0000 mg | ORAL_TABLET | Freq: Every day | ORAL | 0 refills | Status: AC

## 2023-04-02 MED ORDER — METHYLPREDNISOLONE SODIUM SUCC 125 MG IJ SOLR
60.0000 mg | Freq: Once | INTRAMUSCULAR | Status: AC
  Administered 2023-04-02: 125 mg via INTRAMUSCULAR

## 2023-04-02 NOTE — ED Provider Notes (Signed)
[facility]    CSN: [id number] Arrival date & time: 04/02/23  1655      History   Chief Complaint Chief Complaint  Patient presents with   Wheezing    Having bad asthma attack Having chest pains - Entered by patient    HPI Amy [name] is a 27 y.o. female.   Patient presents today for 10 days of asthma flareup.  Reports congested cough, shortness of breath, wheezing, chest tightness, headache, fatigue, and waking up in the middle the night gasping for air.  She denies fever, bodyaches or chills, dry cough, chest pain, runny or stuffy nose, sore throat, ear pain, abdominal pain, nausea/vomiting, diarrhea, and decreased appetite.  Has been using nebulizer which does seem to help with symptoms for 3 to 4 hours.  Reports she began coughing up brown sputum the past 24 hours and became concerned.  She reports history of asthma.    Past Medical History:  Diagnosis Date   Asthma    Family history of adverse reaction to anesthesia    Migraines    Narcolepsy    PONV (postoperative nausea and vomiting)    Pregnancy 12/27/2019   PTSD (post-traumatic stress disorder)    Seasonal allergies     Patient Active Problem List   Diagnosis Date Noted   Upper respiratory infection 03/26/2023   Encounter for cervical Pap smear with pelvic exam 02/19/2023   Neck pain 02/02/2023   Vaginal discharge 02/02/2023   Acute neck pain 01/04/2023   Stress incontinence 02/15/2021   Ovarian cyst 06/30/2020   PTSD (post-traumatic stress disorder) 06/30/2020   Anxiety 06/30/2020   History of laparoscopic cholecystectomy 06/21/2020   Asthma 02/25/2019   Eczema 02/25/2019    Past Surgical History:  Procedure Laterality Date   CHOLECYSTECTOMY N/A 06/20/2020   Procedure: LAPAROSCOPIC CHOLECYSTECTOMY;  Surgeon: Franky Macho, MD;  Location: [facility];  Service: General;  Laterality: N/A;   TONSILLECTOMY     WISDOM TOOTH EXTRACTION      OB History     Gravida  1   Para       Term      Preterm      AB      Living  1      SAB      IAB      Ectopic      Multiple      Live Births               Home Medications    Prior to Admission medications   Medication Sig Start Date End Date Taking? Authorizing Provider  predniSONE (DELTASONE) 20 MG tablet Take 2 tablets (40 mg total) by mouth daily with breakfast for 5 days. 04/02/23 04/07/23 Yes Valentino Nose, NP  albuterol (VENTOLIN HFA) 108 (90 Base) MCG/ACT inhaler Inhale 1-2 puffs into the lungs every 6 (six) hours as needed for wheezing or shortness of breath. 01/31/23   Del Nigel Berthold, FNP  amoxicillin-clavulanate (AUGMENTIN) 875-125 MG tablet Take 1 tablet by mouth 2 (two) times daily for 7 days. 03/26/23 04/02/23  Del Nigel Berthold, FNP  Azelastine-Fluticasone 137-50 MCG/ACT SUSP Place 1 spray into the nose every 12 (twelve) hours. 03/26/23   Del Nigel Berthold, FNP  benzonatate (TESSALON) 100 MG capsule Take 1 capsule (100 mg total) by mouth 2 (two) times daily as needed for cough. 03/26/23   Del Nigel Berthold, FNP  cetirizine (ZYRTEC) 10 MG tablet Take 1 tablet (10  mg total) by mouth daily. 11/19/21   Bing NeighborsHarris, Kimberly S, NP  diphenhydrAMINE (BENADRYL) 25 MG tablet Take 25 mg by mouth every 6 (six) hours as needed.    [provider]  ipratropium-albuterol (DUONEB) 0.5-2.5 (3) MG/3ML SOLN Take 3 mLs by nebulization every 6 (six) hours. 03/26/23   Del Nigel Bertholdrbe Polanco, Iliana, FNP  loratadine (CLARITIN) 10 MG tablet Take 1 tablet (10 mg total) by mouth daily. 04/14/20 04/14/21  Kendell BaneShahmehdi, Seyed A, MD  methocarbamol (ROBAXIN) 500 MG tablet Take 1 tablet (500 mg total) by mouth 2 (two) times daily. 01/04/23   Del Newman Niprbe Polanco, Tenna ChildIliana, FNP  mometasone-formoterol (DULERA) 100-5 MCG/ACT AERO INHALE 2 PUFFS BY MOUTH TWICE DAILY. RINSE MOUTH AFTER EACH USE 01/31/23   Del Newman Niprbe Polanco, Tenna ChildIliana, FNP  montelukast (SINGULAIR) 10 MG tablet Take 1 tablet (10 mg total) by mouth at bedtime.  01/31/23   Del Nigel Bertholdrbe Polanco, Iliana, FNP    Family History Family History  Problem Relation Age of Onset   Diabetes Mother    Hypertension Mother    Irritable bowel syndrome Mother    Diabetes Father    Cancer Father        basal cell cancer   Cancer Maternal Uncle        brain   Crohn's disease Maternal Aunt    Heart disease Maternal Grandmother    Heart disease Maternal Grandfather    Diabetes Maternal Grandfather     Social History Social History   Tobacco Use   Smoking status: Never   Smokeless tobacco: Never  Vaping Use   Vaping Use: Never used  Substance Use Topics   Alcohol use: Not Currently    Comment: occ   Drug use: No     Allergies   Benzyl alcohol, Cough syrup [guaifenesin], Haemophilus influenzae vaccines, Other, Petrolatum, and Iodine   Review of Systems Review of Systems Per HPI  Physical Exam Triage Vital Signs ED Triage Vitals  Enc Vitals Group     BP 04/02/23 1710 (!) 146/81     Pulse Rate 04/02/23 1710 85     Resp 04/02/23 1710 18     Temp 04/02/23 1710 97.8 F (36.6 C)     Temp Source 04/02/23 1710 Oral     SpO2 04/02/23 1710 94 %     Weight --      Height --      Head Circumference --      Peak Flow --      Pain Score 04/02/23 1712 6     Pain Loc --      Pain Edu? --      Excl. in GC? --    No data found.  Updated Vital Signs BP (!) 146/81 (BP Location: Right Arm)   Pulse 85   Temp 97.8 F (36.6 C) (Oral)   Resp 18   LMP 03/25/2023 (Exact Date)   SpO2 94%   Visual Acuity Right Eye Distance:   Left Eye Distance:   Bilateral Distance:    Right Eye Near:   Left Eye Near:    Bilateral Near:     Physical Exam Vitals and nursing note reviewed.  Constitutional:      General: She is not in acute distress.    Appearance: Normal appearance. She is not ill-appearing or toxic-appearing.  HENT:     Head: Normocephalic and atraumatic.     Right Ear: Tympanic membrane, ear canal and external ear normal.     Left Ear:  Tympanic membrane, ear canal and external ear normal.     Nose: No congestion or rhinorrhea.     Mouth/Throat:     Mouth: Mucous membranes are moist.     Pharynx: Oropharynx is clear. Posterior oropharyngeal erythema present. No oropharyngeal exudate.  Eyes:     General: No scleral icterus.    Extraocular Movements: Extraocular movements intact.  Cardiovascular:     Rate and Rhythm: Normal rate and regular rhythm.  Pulmonary:     Effort: Pulmonary effort is normal. No respiratory distress.     Breath sounds: Normal breath sounds. No wheezing, rhonchi or rales.  Musculoskeletal:     Cervical back: Normal range of motion and neck supple.  Lymphadenopathy:     Cervical: No cervical adenopathy.  Skin:    General: Skin is warm and dry.     Coloration: Skin is not jaundiced or pale.     Findings: No erythema or rash.  Neurological:     Mental Status: She is alert and oriented to person, place, and time.      UC Treatments / Results  Labs (all labs ordered are listed, but only abnormal results are displayed) Labs Reviewed - No data to display  EKG   Radiology DG Chest 2 View  Result Date: 04/02/2023 CLINICAL DATA:  Cough, asthma. EXAM: CHEST - 2 VIEW COMPARISON:  Chest x-ray 09/22/2022 FINDINGS: The heart size and mediastinal contours are within normal limits. Both lungs are clear. The visualized skeletal structures are unremarkable. There surgical clips in the upper abdomen. IMPRESSION: No active cardiopulmonary disease. Electronically Signed   By: Darliss Cheney M.D.   On: 04/02/2023 17:36    Procedures Procedures (including critical care time)  Medications Ordered in UC Medications  methylPREDNISolone sodium succinate (SOLU-MEDROL) 125 mg/2 mL injection 60 mg (125 mg Intramuscular Given 04/02/23 1800)    Initial Impression / Assessment and Plan / UC Course  I have reviewed the triage vital signs and the nursing notes.  Pertinent labs & imaging results that were available  during my care of the patient were reviewed by me and considered in my medical decision making (see chart for details).   Patient is well-appearing, normotensive, afebrile, not tachycardic, not tachypneic, oxygenating well on room air.    1. Mild intermittent asthma with acute exacerbation Chest x-ray today is negative for acute cardiopulmonary disease Vital signs and examination today are reassuring-no wheezing today Will treat asthma exacerbation with Solu-Medrol 60 mg IM today in urgent care, start oral prednisone tomorrow Continue nebulizer every 4-6 hours as needed for wheezing or shortness of breath Strict ER precautions discussed with patient and follow-up with primary care provider for persistent symptoms despite treatment  The patient was given the opportunity to ask questions.  All questions answered to their satisfaction.  The patient is in agreement to this plan.    Final Clinical Impressions(s) / UC Diagnoses   Final diagnoses:  Mild intermittent asthma with acute exacerbation     Discharge Instructions      The chest x-ray today looks great-no pneumonia  I suspect you are having an exacerbation of asthma  Continue breathing treatments at home every 4-6 hours as needed for wheezing or shortness of breath.  We have given you a shot of steroid today to help with lung inflammation.  Please start prednisone for Singh in the morning.  Some things that can make you feel better are: - Increased rest - Increasing fluid with water/sugar free electrolytes - Acetaminophen and  ibuprofen as needed for fever/pain - Salt water gargling, chloraseptic spray and throat lozenges - OTC guaifenesin (Mucinex) 600 mg twice daily - Saline sinus flushes or a neti pot - Humidifying the air  Follow-up with primary care provider if symptoms persist despite treatment      ED Prescriptions     Medication Sig Dispense Auth. Provider   predniSONE (DELTASONE) 20 MG tablet Take 2 tablets (40  mg total) by mouth daily with breakfast for 5 days. 10 tablet Valentino Nose, NP      PDMP not reviewed this encounter.   Valentino Nose, NP 04/02/23 [phone]

## 2023-04-02 NOTE — Discharge Instructions (Signed)
The chest x-ray today looks great-no pneumonia  I suspect you are having an exacerbation of asthma  Continue breathing treatments at home every 4-6 hours as needed for wheezing or shortness of breath.  We have given you a shot of steroid today to help with lung inflammation.  Please start prednisone for Amy [name] in the morning.  Some things that can make you feel better are: - Increased rest - Increasing fluid with water/sugar free electrolytes - Acetaminophen and ibuprofen as needed for fever/pain - Salt water gargling, chloraseptic spray and throat lozenges - OTC guaifenesin (Mucinex) 600 mg twice daily - Saline sinus flushes or a neti pot - Humidifying the air  Follow-up with primary care provider if symptoms persist despite treatment

## 2023-04-02 NOTE — ED Triage Notes (Signed)
States asthma has flared up over the past week and a half.  States she is waking up in her sleep not able to breath.  States she is coughing up brown sputum over the past 24 hours.

## 2023-05-12 ENCOUNTER — Encounter (HOSPITAL_COMMUNITY): Payer: Self-pay

## 2023-05-12 ENCOUNTER — Emergency Department (HOSPITAL_COMMUNITY)
Admission: EM | Admit: 2023-05-12 | Discharge: 2023-05-12 | Disposition: A | Discharge status: Home / Self Care | Payer: Medicaid Other | Attending: Emergency Medicine | Admitting: Emergency Medicine

## 2023-05-12 ENCOUNTER — Emergency Department (HOSPITAL_COMMUNITY): Payer: Medicaid Other

## 2023-05-12 ENCOUNTER — Other Ambulatory Visit: Payer: Self-pay

## 2023-05-12 DIAGNOSIS — J45909 Unspecified asthma, uncomplicated: Secondary | ICD-10-CM | POA: Insufficient documentation

## 2023-05-12 DIAGNOSIS — R112 Nausea with vomiting, unspecified: Secondary | ICD-10-CM

## 2023-05-12 DIAGNOSIS — Z1152 Encounter for screening for COVID-19: Secondary | ICD-10-CM | POA: Diagnosis not present

## 2023-05-12 DIAGNOSIS — R519 Headache, unspecified: Secondary | ICD-10-CM

## 2023-05-12 DIAGNOSIS — R6889 Other general symptoms and signs: Secondary | ICD-10-CM

## 2023-05-12 LAB — RESP PANEL BY RT-PCR (RSV, FLU A&B, COVID)  RVPGX2
Influenza A by PCR: NEGATIVE
Influenza B by PCR: NEGATIVE
Resp Syncytial Virus by PCR: NEGATIVE
SARS Coronavirus 2 by RT PCR: NEGATIVE

## 2023-05-12 MED ORDER — ONDANSETRON HCL 4 MG PO TABS: 4.0000 mg | ORAL_TABLET | Freq: Four times a day (QID) | ORAL | 0 refills | Status: DC

## 2023-05-12 MED ORDER — DIPHENHYDRAMINE HCL 50 MG/ML IJ SOLN
25.0000 mg | Freq: Once | INTRAMUSCULAR | Status: AC
  Administered 2023-05-12: 25 mg via INTRAVENOUS
  Filled 2023-05-12: qty 1

## 2023-05-12 MED ORDER — KETOROLAC TROMETHAMINE 15 MG/ML IJ SOLN
15.0000 mg | Freq: Once | INTRAMUSCULAR | Status: AC
  Administered 2023-05-12: 15 mg via INTRAVENOUS
  Filled 2023-05-12: qty 1

## 2023-05-12 MED ORDER — SODIUM CHLORIDE 0.9 % IV BOLUS
1000.0000 mL | Freq: Once | INTRAVENOUS | Status: AC
  Administered 2023-05-12: 1000 mL via INTRAVENOUS

## 2023-05-12 MED ORDER — PROCHLORPERAZINE EDISYLATE 10 MG/2ML IJ SOLN
10.0000 mg | Freq: Once | INTRAMUSCULAR | Status: AC
  Administered 2023-05-12: 10 mg via INTRAVENOUS
  Filled 2023-05-12: qty 2

## 2023-05-12 NOTE — ED Provider Notes (Signed)
[place] EMERGENCY DEPARTMENT AT [facility] Provider Note   CSN: [id number] Arrival date & time: 05/12/23  1337     History  Chief Complaint  Patient presents with   flu like symptoms    Amy [name] is a 27 y.o. female with past medical history significant for asthma, migraines who presents with concern for headache, fever, body aches, nausea, cough since yesterday.  Patient reports that she is had some vomiting with production of green sputum.  Patient reports that she has been coughing up brownish sputum.  Patient reports that she had a fever of 100.4 yesterday.  Patient denies any chest pain, shortness of breath.  She denies any numbness, tingling, vision changes.  She denies any recent head injury.  She denies any known sick contacts but reports that she does have a 73-year-old.  HPI     Home Medications Prior to Admission medications   Medication Sig Start Date End Date Taking? Authorizing Provider  ondansetron (ZOFRAN) 4 MG tablet Take 1 tablet (4 mg total) by mouth every 6 (six) hours. 05/12/23  Yes Soliyana Mcchristian H, PA-C  albuterol (VENTOLIN HFA) 108 (90 Base) MCG/ACT inhaler Inhale 1-2 puffs into the lungs every 6 (six) hours as needed for wheezing or shortness of breath. 01/31/23   Del Nigel Berthold, FNP  Azelastine-Fluticasone 137-50 MCG/ACT SUSP Place 1 spray into the nose every 12 (twelve) hours. 03/26/23   Del Nigel Berthold, FNP  benzonatate (TESSALON) 100 MG capsule Take 1 capsule (100 mg total) by mouth 2 (two) times daily as needed for cough. 03/26/23   Del Nigel Berthold, FNP  cetirizine (ZYRTEC) 10 MG tablet Take 1 tablet (10 mg total) by mouth daily. 11/19/21   Bing Neighbors, NP  diphenhydrAMINE (BENADRYL) 25 MG tablet Take 25 mg by mouth every 6 (six) hours as needed.    [provider]  ipratropium-albuterol (DUONEB) 0.5-2.5 (3) MG/3ML SOLN Take 3 mLs by nebulization every 6 (six) hours. 03/26/23   Del Nigel Berthold, FNP  loratadine (CLARITIN) 10 MG tablet Take 1 tablet (10 mg total) by mouth daily. 04/14/20 04/14/21  Kendell Bane, MD  methocarbamol (ROBAXIN) 500 MG tablet Take 1 tablet (500 mg total) by mouth 2 (two) times daily. 01/04/23   Del Newman Nip, Tenna Child, FNP  mometasone-formoterol (DULERA) 100-5 MCG/ACT AERO INHALE 2 PUFFS BY MOUTH TWICE DAILY. RINSE MOUTH AFTER EACH USE 01/31/23   Del Newman Nip, Tenna Child, FNP  montelukast (SINGULAIR) 10 MG tablet Take 1 tablet (10 mg total) by mouth at bedtime. 01/31/23   Del Nigel Berthold, FNP      Allergies    Benzyl alcohol, Cough syrup [guaifenesin], Haemophilus influenzae vaccines, Other, Petrolatum, and Iodine    Review of Systems   Review of Systems  All other systems reviewed and are negative.   Physical Exam Updated Vital Signs BP 126/79 (BP Location: Left Arm)   Pulse 90   Temp 98.7 F (37.1 C) (Oral)   Resp 16   Ht 5\' 4"  (1.626 m)   Wt 89.8 kg   LMP 04/21/2023 (Approximate)   SpO2 98%   BMI 33.99 kg/m  Physical Exam Vitals and nursing note reviewed.  Constitutional:      General: She is not in acute distress.    Appearance: Normal appearance.  HENT:     Head: Normocephalic and atraumatic.  Eyes:     General:        Right eye: No  discharge.        Left eye: No discharge.  Cardiovascular:     Rate and Rhythm: Normal rate and regular rhythm.     Heart sounds: No murmur heard.    No friction rub. No gallop.  Pulmonary:     Effort: Pulmonary effort is normal.     Breath sounds: Normal breath sounds.     Comments: No wheezing, rhonchi, stridor, rales Abdominal:     General: Bowel sounds are normal.     Palpations: Abdomen is soft.  Skin:    General: Skin is warm and dry.     Capillary Refill: Capillary refill takes less than 2 seconds.  Neurological:     Mental Status: She is alert and oriented to person, place, and time.     Comments: Moves all 4 limbs spontaneously, CN II through XII grossly  intact, can ambulate without difficulty, intact sensation throughout.   Psychiatric:        Mood and Affect: Mood normal.        Behavior: Behavior normal.     ED Results / Procedures / Treatments   Labs (all labs ordered are listed, but only abnormal results are displayed) Labs Reviewed  RESP PANEL BY RT-PCR (RSV, FLU A&B, COVID)  RVPGX2  I-STAT BETA HCG BLOOD, ED ([facility], [facility], [facility] ONLY)    EKG None  Radiology DG Chest 2 View  Result Date: 05/12/2023 CLINICAL DATA:  One day history of fever, body aches, and cough EXAM: CHEST - 2 VIEW COMPARISON:  Chest radiograph dated 04/02/2023 FINDINGS: Normal lung volumes. No focal consolidations. No pleural effusion or pneumothorax. The heart size and mediastinal contours are within normal limits. No acute osseous abnormality. Right upper quadrant surgical clips. IMPRESSION: No active cardiopulmonary disease. Electronically Signed   By: Agustin Cree M.D.   On: 05/12/2023 14:51    Procedures Procedures    Medications Ordered in ED Medications  sodium chloride 0.9 % bolus 1,000 mL (1,000 mLs Intravenous New Bag/Given 05/12/23 1446)  prochlorperazine (COMPAZINE) injection 10 mg (10 mg Intravenous Given 05/12/23 1446)  diphenhydrAMINE (BENADRYL) injection 25 mg (25 mg Intravenous Given 05/12/23 1447)  ketorolac (TORADOL) 15 MG/ML injection 15 mg (15 mg Intravenous Given 05/12/23 1447)    ED Course/ Medical Decision Making/ A&P                             Medical Decision Making Amount and/or Complexity of Data Reviewed Radiology: ordered.  Risk Prescription drug management.   This patient is a 27 y.o. female  who presents to the ED for concern of headache, flulike symptoms.   Differential diagnoses prior to evaluation: The emergent differential diagnosis includes, but is not limited to,  Stroke, increased ICP, meningitis, CVA, intracranial tumor, venous sinus thrombosis, migraine, cluster headache, hypertension, drug related, head injury,  tension headache, sinusitis, dental abscess, otitis media, TMJ, for flulike symptoms Considered viral illness, acute bronchitis, pneumonia, overall think that her generalized bodyaches, nausea, vomiting, headache are all likely unified under a presenting viral illness.  As her vital signs are stable I have an overall low clinical suspicion for pneumonia but given some productive sputum I do think reasonable to workup for pneumonia as well with CXR. This is not an exhaustive differential.   Past Medical History / Co-morbidities: Patient endorses history of migraines, asthma  Physical Exam: Physical exam performed. The pertinent findings include: No wheezing, rhonchi, stridor, rales, or focal consolidation on  auscultation of lungs, she has no focal neurologic deficits on my exam.  Lab Tests/Imaging studies: I personally interpreted labs/imaging and the pertinent results include: COVID, flu swab pending at this time, but discussed with patient that I would not alter her management even if she was positive for COVID, flu, she can check her results on her patient portal if she is curious about her results..  I independently interpreted plain film chest x-ray which shows no evidence of acute intrathoracic abnormality.  I agree with the radiologist interpretation.   Medications: I ordered medication including migraine cocktail for headache symptoms, body aches, possible mild dehydration secondary to significant vomiting.  I have reviewed the patients home medicines and have made adjustments as needed.   Disposition: After consideration of the diagnostic results and the patients response to treatment, I feel that patient feeling improved after migraine cocktail, will discharge with nausea medication, encouraged rest, ibuprofen, Tylenol, plenty of fluids.[name]   emergency department workup does not suggest an emergent condition requiring admission or immediate intervention beyond what has been performed at this  time. The plan is: as above. The patient is safe for discharge and has been instructed to return immediately for worsening symptoms, change in symptoms or any other concerns.  Final Clinical Impression(s) / ED Diagnoses Final diagnoses:  Flu-like symptoms  Nausea and vomiting, unspecified vomiting type  Acute nonintractable headache, unspecified headache type    Rx / DC Orders ED Discharge Orders          Ordered    ondansetron (ZOFRAN) 4 MG tablet  Every 6 hours        05/12/23 1534              Waylon Hershey, Harrel Carina, PA-C 05/12/23 1535    Terrilee Files, MD 05/12/23 1733

## 2023-05-12 NOTE — ED Triage Notes (Signed)
Pt reports headache, fever, body aches, nausea, and cough since yesterday.

## 2023-05-12 NOTE — Discharge Instructions (Signed)
Please use Tylenol or ibuprofen for pain.  You may use 600 mg ibuprofen every 6 hours or 1000 mg of Tylenol every 6 hours.  You may choose to alternate between the 2.  This would be most effective.  Not to exceed 4 g of Tylenol within 24 hours.  Not to exceed 3200 mg ibuprofen 24 hours.  I recommend plenty of fluids including electrolyte containing fluids such as Gatorade, Pedialyte, you can use the nausea medication I prescribed as needed to help with ongoing nausea, and help to keep fluids and food on your stomach.  You can follow-up with your primary care doctor if you have concern for ongoing symptoms, but I would give yourself several days to get over what is likely a viral illness.

## 2023-05-14 ENCOUNTER — Telehealth: Payer: Self-pay

## 2023-05-14 NOTE — Transitions of Care (Post Inpatient/ED Visit) (Signed)
05/14/2023  Name: Amy [name] MRN: [id number] DOB: 08/05/1996  Today's TOC FU Call Status: Today's TOC FU Call Status:: Successful TOC FU Call Competed TOC FU Call Complete Date: 05/14/23  Transition Care Management Follow-up Telephone Call Date of Discharge: 05/12/23 [facility] Type of Discharge: Emergency Department Reason for ED Visit: Other: (Flu-like symptoms) How have you been since you were released from the hospital?: Better Any questions or concerns?: No  Items Reviewed: Did you receive and understand the discharge instructions provided?: Yes Medications obtained,verified, and reconciled?: Yes (Medications Reviewed) Any new allergies since your discharge?: No Dietary orders reviewed?: NA Do you have support at home?: Yes  Medications Reviewed Today: Medications Reviewed Today     Reviewed by Leigh Aurora, CMA (Certified Medical Assistant) on 05/14/23 at 1525  Med List Status: <None>   Medication Order Taking? Sig Documenting Provider Last Dose Status Informant  albuterol (VENTOLIN HFA) 108 (90 Base) MCG/ACT inhaler [id number] No Inhale 1-2 puffs into the lungs every 6 (six) hours as needed for wheezing or shortness of breath. Del Nigel Berthold, FNP Taking Active   Azelastine-Fluticasone 137-50 MCG/ACT Santina Evans [id number]  Place 1 spray into the nose every 12 (twelve) hours. Del Newman Nip, Tenna Child, FNP  Active   benzonatate (TESSALON) 100 MG capsule [id number]  Take 1 capsule (100 mg total) by mouth 2 (two) times daily as needed for cough. Del Newman Nip, Tenna Child, FNP  Active   cetirizine (ZYRTEC) 10 MG tablet [id number] No Take 1 tablet (10 mg total) by mouth daily. Bing Neighbors, NP Taking Active   diphenhydrAMINE (BENADRYL) 25 MG tablet [id number] No Take 25 mg by mouth every 6 (six) hours as needed. [provider] Taking Active   ipratropium-albuterol (DUONEB) 0.5-2.5 (3) MG/3ML SOLN [id number]  Take 3 mLs  by nebulization every 6 (six) hours. Del Newman Nip, Tenna Child, FNP  Active   loratadine (CLARITIN) 10 MG tablet [id number] No Take 1 tablet (10 mg total) by mouth daily. Kendell Bane, MD Taking Expired 04/14/21 2359 Self  methocarbamol (ROBAXIN) 500 MG tablet [id number] No Take 1 tablet (500 mg total) by mouth 2 (two) times daily. Del Newman Nip, Tenna Child, FNP Taking Active   mometasone-formoterol [facility]) 100-5 MCG/ACT Sandrea Matte [id number] No INHALE 2 PUFFS BY MOUTH TWICE DAILY. RINSE MOUTH AFTER EACH USE Del Newman Nip, [place], FNP Taking Active   montelukast (SINGULAIR) 10 MG tablet [id number] No Take 1 tablet (10 mg total) by mouth at bedtime. Del Newman Nip, Tenna Child, FNP Taking Active   ondansetron (ZOFRAN) 4 MG tablet [id number]  Take 1 tablet (4 mg total) by mouth every 6 (six) hours. Prosperi, Harrel Carina, PA-C  Active             Home Care and Equipment/Supplies: Were Home Health Services Ordered?: NA Any new equipment or medical supplies ordered?: NA  Functional Questionnaire: Do you need assistance with bathing/showering or dressing?: No Do you need assistance with meal preparation?: No Do you need assistance with eating?: No Do you have difficulty maintaining continence: No Do you need assistance with getting out of bed/getting out of a chair/moving?: No Do you have difficulty managing or taking your medications?: No  Follow up appointments reviewed: PCP Follow-up appointment confirmed?: No (pt declined) MD Provider Line Number:[phone] Given: Yes Specialist Hospital Follow-up appointment confirmed?: NA Do you need transportation to your follow-up appointment?: No Do you understand care options if your condition(s) worsen?: Yes-patient verbalized understanding    SIGNATURE Elease Hashimoto  Roxan Hockey, CMA (AAMA)  [facility] [phone]

## 2023-05-16 ENCOUNTER — Encounter: Payer: Self-pay | Admitting: Family Medicine

## 2023-05-16 NOTE — Telephone Encounter (Signed)
I recommend going to the ED or schedule an appt

## 2023-05-21 ENCOUNTER — Ambulatory Visit
Admission: EM | Admit: 2023-05-21 | Discharge: 2023-05-21 | Disposition: A | Discharge status: Home / Self Care | Payer: Medicaid Other | Attending: Nurse Practitioner | Admitting: Nurse Practitioner

## 2023-05-21 ENCOUNTER — Encounter: Payer: Self-pay | Admitting: Emergency Medicine

## 2023-05-21 ENCOUNTER — Other Ambulatory Visit: Payer: Self-pay

## 2023-05-21 DIAGNOSIS — N898 Other specified noninflammatory disorders of vagina: Secondary | ICD-10-CM | POA: Diagnosis present

## 2023-05-21 LAB — POCT URINALYSIS DIP (MANUAL ENTRY)
Bilirubin, UA: NEGATIVE
Blood, UA: NEGATIVE
Glucose, UA: NEGATIVE mg/dL
Ketones, POC UA: NEGATIVE mg/dL
Nitrite, UA: NEGATIVE
Protein Ur, POC: NEGATIVE mg/dL
Spec Grav, UA: >=1.03 — AB (ref 1.010–1.025)
Urobilinogen, UA: 0.2 E.U./dL
pH, UA: 5.5 (ref 5.0–8.0)

## 2023-05-21 LAB — POCT URINE PREGNANCY: Preg Test, Ur: NEGATIVE

## 2023-05-21 MED ORDER — FLUCONAZOLE 150 MG PO TABS: 150.0000 mg | ORAL_TABLET | Freq: Once | ORAL | 0 refills | Status: AC

## 2023-05-21 NOTE — ED Triage Notes (Signed)
Pt reports vaginal discharge for last several days and intermittent abd cramps, increased urinary frequency.

## 2023-05-21 NOTE — Discharge Instructions (Signed)
Please take the fluconazole as prescribed to treat possible yeast infection.  If the testing from today comes back showing anything other than yeast infection, we will contact you and arrange treatment at that time.  Urinalysis today shows you need to drink more water and a little white blood cell.  I do not think you have a urinary tract infection, therefore we did not do a urine culture.  Urine pregnancy test today is negative.  Recommended condom use with every sexual encounter moving forward.

## 2023-05-21 NOTE — ED Provider Notes (Signed)
[facility]    CSN: [id number] Arrival date & time: 05/21/23  1029      History   Chief Complaint Chief Complaint  Patient presents with   Vaginal Discharge    HPI Amy [name] is a 27 y.o. female.   Patient presents today for 3 days history of white, creamy vaginal discharge and vaginal itching.  She denies vaginal odor, new vaginal sores, rashes , or lesions.  No dysuria, increased urinary frequency and urgency.  She denies current abdominal pain, hematuria, foul urinary odor, new back pain, fever, nausea/vomiting.  No concern for STIs today, reports she is sexually active.  Reports she thinks she has a yeast infection.     Past Medical History:  Diagnosis Date   Asthma    Family history of adverse reaction to anesthesia    Migraines    Narcolepsy    PONV (postoperative nausea and vomiting)    Pregnancy 12/27/2019   PTSD (post-traumatic stress disorder)    Seasonal allergies     Patient Active Problem List   Diagnosis Date Noted   Upper respiratory infection 03/26/2023   Encounter for cervical Pap smear with pelvic exam 02/19/2023   Neck pain 02/02/2023   Vaginal discharge 02/02/2023   Acute neck pain 01/04/2023   Stress incontinence 02/15/2021   Ovarian cyst 06/30/2020   PTSD (post-traumatic stress disorder) 06/30/2020   Anxiety 06/30/2020   History of laparoscopic cholecystectomy 06/21/2020   Asthma 02/25/2019   Eczema 02/25/2019    Past Surgical History:  Procedure Laterality Date   CHOLECYSTECTOMY N/A 06/20/2020   Procedure: LAPAROSCOPIC CHOLECYSTECTOMY;  Surgeon: Franky Macho, MD;  Location: [facility];  Service: General;  Laterality: N/A;   TONSILLECTOMY     WISDOM TOOTH EXTRACTION      OB History     Gravida  1   Para      Term      Preterm      AB      Living  1      SAB      IAB      Ectopic      Multiple      Live Births               Home Medications    Prior to Admission medications    Medication Sig Start Date End Date Taking? Authorizing Provider  fluconazole (DIFLUCAN) 150 MG tablet Take 1 tablet (150 mg total) by mouth once for 1 dose. 05/21/23 05/21/23 Yes Valentino Nose, NP  albuterol (VENTOLIN HFA) 108 (90 Base) MCG/ACT inhaler Inhale 1-2 puffs into the lungs every 6 (six) hours as needed for wheezing or shortness of breath. 01/31/23   Del Nigel Berthold, FNP  Azelastine-Fluticasone 137-50 MCG/ACT SUSP Place 1 spray into the nose every 12 (twelve) hours. 03/26/23   Del Nigel Berthold, FNP  benzonatate (TESSALON) 100 MG capsule Take 1 capsule (100 mg total) by mouth 2 (two) times daily as needed for cough. 03/26/23   Del Nigel Berthold, FNP  cetirizine (ZYRTEC) 10 MG tablet Take 1 tablet (10 mg total) by mouth daily. 11/19/21   Bing Neighbors, NP  diphenhydrAMINE (BENADRYL) 25 MG tablet Take 25 mg by mouth every 6 (six) hours as needed.    [provider]  ipratropium-albuterol (DUONEB) 0.5-2.5 (3) MG/3ML SOLN Take 3 mLs by nebulization every 6 (six) hours. 03/26/23   Del Nigel Berthold, FNP  loratadine (CLARITIN) 10 MG tablet Take  1 tablet (10 mg total) by mouth daily. 04/14/20 04/14/21  Kendell Bane, MD  methocarbamol (ROBAXIN) 500 MG tablet Take 1 tablet (500 mg total) by mouth 2 (two) times daily. 01/04/23   Del Newman Nip, Tenna Child, FNP  mometasone-formoterol (DULERA) 100-5 MCG/ACT AERO INHALE 2 PUFFS BY MOUTH TWICE DAILY. RINSE MOUTH AFTER EACH USE 01/31/23   Del Newman Nip, Tenna Child, FNP  montelukast (SINGULAIR) 10 MG tablet Take 1 tablet (10 mg total) by mouth at bedtime. 01/31/23   Del Nigel Berthold, FNP  ondansetron (ZOFRAN) 4 MG tablet Take 1 tablet (4 mg total) by mouth every 6 (six) hours. 05/12/23   Prosperi, Harrel Carina, PA-C    Family History Family History  Problem Relation Age of Onset   Diabetes Mother    Hypertension Mother    Irritable bowel syndrome Mother    Diabetes Father    Cancer Father        basal cell  cancer   Cancer Maternal Uncle        brain   Crohn's disease Maternal Aunt    Heart disease Maternal Grandmother    Heart disease Maternal Grandfather    Diabetes Maternal Grandfather     Social History Social History   Tobacco Use   Smoking status: Never   Smokeless tobacco: Never  Vaping Use   Vaping Use: Never used  Substance Use Topics   Alcohol use: Not Currently    Comment: occ   Drug use: No     Allergies   Benzyl alcohol, Cough syrup [guaifenesin], Haemophilus influenzae vaccines, Other, Petrolatum, and Iodine   Review of Systems Review of Systems Per HPI  Physical Exam Triage Vital Signs ED Triage Vitals  Enc Vitals Group     BP 05/21/23 1204 117/81     Pulse Rate 05/21/23 1204 76     Resp 05/21/23 1204 20     Temp 05/21/23 1204 97.8 F (36.6 C)     Temp Source 05/21/23 1204 Oral     SpO2 05/21/23 1204 96 %     Weight --      Height --      Head Circumference --      Peak Flow --      Pain Score 05/21/23 1203 0     Pain Loc --      Pain Edu? --      Excl. in GC? --    No data found.  Updated Vital Signs BP 117/81 (BP Location: Right Arm)   Pulse 76   Temp 97.8 F (36.6 C) (Oral)   Resp 20   LMP 04/21/2023 (Approximate)   SpO2 96%   Visual Acuity Right Eye Distance:   Left Eye Distance:   Bilateral Distance:    Right Eye Near:   Left Eye Near:    Bilateral Near:     Physical Exam Vitals and nursing note reviewed.  Constitutional:      General: She is not in acute distress.    Appearance: Normal appearance. She is not toxic-appearing.  Pulmonary:     Effort: Pulmonary effort is normal. No respiratory distress.  Genitourinary:    Comments: Deferred-self swab performed Skin:    General: Skin is warm and dry.     Coloration: Skin is not jaundiced or pale.     Findings: No erythema.  Neurological:     Mental Status: She is alert and oriented to person, place, and time.     Motor: No weakness.  Gait: Gait normal.   Psychiatric:        Mood and Affect: Mood normal.        Behavior: Behavior is cooperative.      UC Treatments / Results  Labs (all labs ordered are listed, but only abnormal results are displayed) Labs Reviewed  POCT URINALYSIS DIP (MANUAL ENTRY) - Abnormal; Notable for the following components:      Result Value   Spec Grav, UA >=1.030 (*)    Leukocytes, UA Trace (*)    All other components within normal limits  POCT URINE PREGNANCY  CERVICOVAGINAL ANCILLARY ONLY    EKG   Radiology No results found.  Procedures Procedures (including critical care time)  Medications Ordered in UC Medications - No data to display  Initial Impression / Assessment and Plan / UC Course  I have reviewed the triage vital signs and the nursing notes.  Pertinent labs & imaging results that were available during my care of the patient were reviewed by me and considered in my medical decision making (see chart for details).   Patient is well-appearing, normotensive, afebrile, not tachycardic, not tachypneic, oxygenating well on room air.    1. Vaginal discharge Suspect yeast infection Treat with fluconazole one-time Cytology is pending Patient declines HIV and syphilis testing today Urinalysis today shows likely dehydration, recommended increasing water intake.  Urine culture deferred given no urinary symptoms UPT today negative Recommended condom use with every sexual encounter  The patient was given the opportunity to ask questions.  All questions answered to their satisfaction.  The patient is in agreement to this plan.    Final Clinical Impressions(s) / UC Diagnoses   Final diagnoses:  Vaginal discharge     Discharge Instructions      Please take the fluconazole as prescribed to treat possible yeast infection.  If the testing from today comes back showing anything other than yeast infection, we will contact you and arrange treatment at that time.  Urinalysis today shows you  need to drink more water and a little white blood cell.  I do not think you have a urinary tract infection, therefore we did not do a urine culture.  Urine pregnancy test today is negative.  Recommended condom use with every sexual encounter moving forward.     ED Prescriptions     Medication Sig Dispense Auth. Provider   fluconazole (DIFLUCAN) 150 MG tablet Take 1 tablet (150 mg total) by mouth once for 1 dose. 1 tablet Valentino Nose, NP      PDMP not reviewed this encounter.   Valentino Nose, NP 05/21/23 1234

## 2023-05-22 ENCOUNTER — Telehealth (HOSPITAL_COMMUNITY): Payer: Self-pay | Admitting: Emergency Medicine

## 2023-05-22 LAB — CERVICOVAGINAL ANCILLARY ONLY
Bacterial Vaginitis (gardnerella): POSITIVE — AB
Candida Glabrata: NEGATIVE
Candida Vaginitis: POSITIVE — AB
Chlamydia: NEGATIVE
Comment: NEGATIVE
Comment: NEGATIVE
Comment: NEGATIVE
Comment: NEGATIVE
Comment: NEGATIVE
Comment: NORMAL
Neisseria Gonorrhea: NEGATIVE
Trichomonas: NEGATIVE

## 2023-05-22 MED ORDER — METRONIDAZOLE 500 MG PO TABS: 500.0000 mg | ORAL_TABLET | Freq: Two times a day (BID) | ORAL | 0 refills | Status: DC

## 2023-05-27 ENCOUNTER — Ambulatory Visit (INDEPENDENT_AMBULATORY_CARE_PROVIDER_SITE_OTHER): Payer: Medicaid Other | Admitting: Family Medicine

## 2023-05-27 ENCOUNTER — Encounter: Payer: Self-pay | Admitting: Family Medicine

## 2023-05-27 VITALS — BP 119/82 | HR 80 | Ht 64.0 in | Wt 201.0 lb

## 2023-05-27 DIAGNOSIS — R062 Wheezing: Secondary | ICD-10-CM | POA: Diagnosis not present

## 2023-05-27 DIAGNOSIS — H6690 Otitis media, unspecified, unspecified ear: Secondary | ICD-10-CM | POA: Diagnosis not present

## 2023-05-27 MED ORDER — MOMETASONE FURO-FORMOTEROL FUM 200-5 MCG/ACT IN AERO
2.0000 | INHALATION_SPRAY | Freq: Two times a day (BID) | RESPIRATORY_TRACT | 1 refills | Status: DC
Start: 2023-05-27 — End: 2024-06-01

## 2023-05-27 MED ORDER — AMOXICILLIN-POT CLAVULANATE 875-125 MG PO TABS
1.0000 | ORAL_TABLET | Freq: Two times a day (BID) | ORAL | 0 refills | Status: AC
Start: 2023-05-27 — End: 2023-06-03

## 2023-05-27 MED ORDER — PREDNISONE 20 MG PO TABS
20.0000 mg | ORAL_TABLET | Freq: Two times a day (BID) | ORAL | 0 refills | Status: AC
Start: 2023-05-27 — End: 2023-06-01

## 2023-05-27 NOTE — Assessment & Plan Note (Signed)
Prednisone 20 mg x 5 days Increase Dulera 200- 5 mcg Discussed signs and symptoms of respiratory distress and need to present to the ED if symptoms worsen. Patient/parent verbalizes understanding regarding plan of care and all questions answered.

## 2023-05-27 NOTE — Assessment & Plan Note (Signed)
Started Augmentin 875-125 mg x 7 days Advise warm compress on affected ear Avoid using Q  tips .

## 2023-05-27 NOTE — Progress Notes (Signed)
Patient Office Visit   Subjective   Patient ID: Amy [name], female    DOB: 10-26-1996  Age: 27 y.o. MRN: [id number]  CC:  Chief Complaint  Patient presents with   Ear Pain    Patient complains of bilateral ear pain, drainage, feeling full starting a week ago.     HPI Amy [name] 27 year old female presents to the clinic for bilateral ear pain started a week ago. She  has a past medical history of Asthma, Family history of adverse reaction to anesthesia, Migraines, Narcolepsy, PONV (postoperative nausea and vomiting), Pregnancy (12/27/2019), PTSD (post-traumatic stress disorder), and Seasonal allergies.  Patient presents with drainage of pus.  Symptoms have been present for 7 days.  During that time there has not been pus draining out  Previous treatment: OTC ibuprofen and mucinex There has not been improvement with current treatment.  With no recent history of swimming.        Outpatient Encounter Medications as of 05/27/2023  Medication Sig   albuterol (VENTOLIN HFA) 108 (90 Base) MCG/ACT inhaler Inhale 1-2 puffs into the lungs every 6 (six) hours as needed for wheezing or shortness of breath.   amoxicillin-clavulanate (AUGMENTIN) 875-125 MG tablet Take 1 tablet by mouth 2 (two) times daily for 7 days.   Azelastine-Fluticasone 137-50 MCG/ACT SUSP Place 1 spray into the nose every 12 (twelve) hours.   benzonatate (TESSALON) 100 MG capsule Take 1 capsule (100 mg total) by mouth 2 (two) times daily as needed for cough.   cetirizine (ZYRTEC) 10 MG tablet Take 1 tablet (10 mg total) by mouth daily.   diphenhydrAMINE (BENADRYL) 25 MG tablet Take 25 mg by mouth every 6 (six) hours as needed.   ipratropium-albuterol (DUONEB) 0.5-2.5 (3) MG/3ML SOLN Take 3 mLs by nebulization every 6 (six) hours.   methocarbamol (ROBAXIN) 500 MG tablet Take 1 tablet (500 mg total) by mouth 2 (two) times daily.   mometasone-formoterol (DULERA) 200-5 MCG/ACT AERO Inhale 2 puffs  into the lungs 2 (two) times daily.   montelukast (SINGULAIR) 10 MG tablet Take 1 tablet (10 mg total) by mouth at bedtime.   ondansetron (ZOFRAN) 4 MG tablet Take 1 tablet (4 mg total) by mouth every 6 (six) hours.   predniSONE (DELTASONE) 20 MG tablet Take 1 tablet (20 mg total) by mouth 2 (two) times daily with a meal for 5 days.   [DISCONTINUED] metroNIDAZOLE (FLAGYL) 500 MG tablet Take 1 tablet (500 mg total) by mouth 2 (two) times daily.   [DISCONTINUED] mometasone-formoterol (DULERA) 100-5 MCG/ACT AERO INHALE 2 PUFFS BY MOUTH TWICE DAILY. RINSE MOUTH AFTER EACH USE   loratadine (CLARITIN) 10 MG tablet Take 1 tablet (10 mg total) by mouth daily.   No facility-administered encounter medications on file as of 05/27/2023.    Past Surgical History:  Procedure Laterality Date   CHOLECYSTECTOMY N/A 06/20/2020   Procedure: LAPAROSCOPIC CHOLECYSTECTOMY;  Surgeon: Franky Macho, MD;  Location: [facility];  Service: General;  Laterality: N/A;   TONSILLECTOMY     WISDOM TOOTH EXTRACTION      Review of Systems  Constitutional:  Negative for chills and fever.  HENT:  Positive for congestion, ear discharge and ear pain.   Eyes:  Negative for blurred vision.  Respiratory:  Positive for wheezing.   Cardiovascular:  Negative for chest pain.  Gastrointestinal:  Negative for abdominal pain.  Genitourinary:  Negative for dysuria.  Musculoskeletal:  Negative for myalgias.  Neurological:  Negative for dizziness and headaches.  Objective    BP 119/82   Pulse 80   Ht 5\' 4"  (1.626 m)   Wt 201 lb (91.2 kg)   LMP 04/21/2023 (Approximate)   SpO2 96%   BMI 34.50 kg/m   Physical Exam Vitals reviewed.  Constitutional:      General: She is not in acute distress.    Appearance: Normal appearance. She is not ill-appearing, toxic-appearing or diaphoretic.  HENT:     Head: Normocephalic.     Right Ear: Tenderness present. Tympanic membrane is erythematous.     Left Ear: Tenderness present. Tympanic  membrane is erythematous.  Eyes:     General:        Right eye: No discharge.        Left eye: No discharge.     Conjunctiva/sclera: Conjunctivae normal.  Cardiovascular:     Rate and Rhythm: Normal rate.     Pulses: Normal pulses.     Heart sounds: Normal heart sounds.  Pulmonary:     Effort: Pulmonary effort is normal.     Breath sounds: Wheezing and rhonchi present.  Chest:     Chest wall: No tenderness.  Musculoskeletal:        General: Normal range of motion.     Cervical back: Normal range of motion.  Skin:    General: Skin is warm and dry.     Capillary Refill: Capillary refill takes less than 2 seconds.  Neurological:     General: No focal deficit present.     Mental Status: She is alert and oriented to person, place, and time.     Coordination: Coordination normal.     Gait: Gait normal.  Psychiatric:        Mood and Affect: Mood normal.        Behavior: Behavior normal.       Assessment & Plan:  Ear infection Assessment & Plan: Started Augmentin 875-125 mg x 7 days Advise warm compress on affected ear Avoid using Q  tips .  Orders: -     Amoxicillin-Pot Clavulanate; Take 1 tablet by mouth 2 (two) times daily for 7 days.  Dispense: 14 tablet; Refill: 0  Bilateral wheezing -     predniSONE; Take 1 tablet (20 mg total) by mouth 2 (two) times daily with a meal for 5 days.  Dispense: 10 tablet; Refill: 0 -     Mometasone Furo-Formoterol Fum; Inhale 2 puffs into the lungs 2 (two) times daily.  Dispense: 13 g; Refill: 1  Wheezing Assessment & Plan: Prednisone 20 mg x 5 days Increase Dulera 200- 5 mcg Discussed signs and symptoms of respiratory distress and need to present to the ED if symptoms worsen. Patient/parent verbalizes understanding regarding plan of care and all questions answered.      Return if symptoms worsen or fail to improve.   Cruzita Lederer Newman Nip, FNP

## 2023-05-27 NOTE — Patient Instructions (Signed)
        Great to see you today.   - Please take medications as prescribed. - Follow up with your primary health provider if any health concerns arises. - If symptoms worsen please contact your primary care provider and/or visit the emergency department.  

## 2023-09-11 ENCOUNTER — Encounter: Payer: Self-pay | Admitting: Family Medicine

## 2023-09-12 ENCOUNTER — Other Ambulatory Visit: Payer: Self-pay | Admitting: Family Medicine

## 2023-09-12 DIAGNOSIS — J45998 Other asthma: Secondary | ICD-10-CM

## 2023-09-15 ENCOUNTER — Other Ambulatory Visit: Payer: Self-pay | Admitting: Family Medicine

## 2023-09-16 NOTE — Progress Notes (Unsigned)
Amy [name], female    DOB: 08/06/1996    MRN: [id number]   Brief patient profile:  5  yowf  never smoker  referred to pulmonary clinic in [place]  09/17/2023 by [place] NP  for poorly controlled asthma.  Age 27 admitted [facility] hosp with pna and "bad asthma ever since" maint on dulera 100 / singulair and freq saba multiple forms. Last pred rx was in 2023 - last allergy eval ? Age 17 says not helpful .  Lives with cat.       History of Present Illness  09/17/2023  Pulmonary/ 1st office eval/ Amy [name] / [facility]  Dyspnea:  swims at [place], uses saba p ex  Cough: severity tied to wheeze/sob / saba need > thick white mucus  Sleep: Flat bed/ 2 pillows / freq awakening  SABA use: only p exertion both hfa and more recently a lot of nebs  02: none   No obvious day to day or daytime pattern/variability or assoc onging  excess/ purulent sputum or mucus plugs or hemoptysis or cp or chest tightness, subjective   overt sinus or hb symptoms.    Also denies any obvious fluctuation of symptoms with weather or environmental changes or other aggravating or alleviating factors except as outlined above   No unusual exposure hx or knowledge of premature birth.  Current Allergies, Complete Past Medical History, Past Surgical History, Family History, and Social History were reviewed in [employer] record.  ROS  The following are not active complaints unless bolded Hoarseness, sore throat, dysphagia, dental problems, itching, sneezing,  nasal congestion or discharge of excess mucus or purulent secretions, ear ache,   fever, chills, sweats, unintended wt loss or wt gain, classically pleuritic or exertional cp,  orthopnea pnd or arm/hand swelling  or leg swelling, presyncope, palpitations, abdominal pain, anorexia, nausea, vomiting, diarrhea  or change in bowel habits or change in bladder habits, change in stools or change in urine, dysuria, hematuria,  rash, arthralgias,  visual complaints, headache, numbness, weakness or ataxia or problems with walking or coordination,  change in mood or  memory.              Outpatient Medications Prior to Visit  Medication Sig Dispense Refill   albuterol (VENTOLIN HFA) 108 (90 Base) MCG/ACT inhaler Inhale 1-2 puffs into the lungs every 6 (six) hours as needed for wheezing or shortness of breath. 18 g 6   Azelastine-Fluticasone 137-50 MCG/ACT SUSP Place 1 spray into the nose every 12 (twelve) hours. 23 g 1   benzonatate (TESSALON) 100 MG capsule Take 1 capsule (100 mg total) by mouth 2 (two) times daily as needed for cough. 20 capsule 0   cetirizine (ZYRTEC) 10 MG tablet Take 1 tablet (10 mg total) by mouth daily. 30 tablet 0   diphenhydrAMINE (BENADRYL) 25 MG tablet Take 25 mg by mouth every 6 (six) hours as needed.     ipratropium-albuterol (DUONEB) 0.5-2.5 (3) MG/3ML SOLN Take 3 mLs by nebulization every 6 (six) hours. 360 mL 5   loratadine (CLARITIN) 10 MG tablet Take 1 tablet (10 mg total) by mouth daily. 30 tablet 5   methocarbamol (ROBAXIN) 500 MG tablet Take 1 tablet (500 mg total) by mouth 2 (two) times daily. 10 tablet 0   mometasone-formoterol (DULERA) 200-5 MCG/ACT AERO Inhale 2 puffs into the lungs 2 (two) times daily. 13 g 1   montelukast (SINGULAIR) 10 MG tablet Take 1 tablet (10 mg total) by mouth at  bedtime. 30 tablet 3   ondansetron (ZOFRAN) 4 MG tablet Take 1 tablet (4 mg total) by mouth every 6 (six) hours. 12 tablet 0   No facility-administered medications prior to visit.    Past Medical History:  Diagnosis Date   Asthma    Family history of adverse reaction to anesthesia    Migraines    Narcolepsy    PONV (postoperative nausea and vomiting)    Pregnancy 12/27/2019   PTSD (post-traumatic stress disorder)    Seasonal allergies       Objective:     There were no vitals taken for this visit.   Amb wf seen in parking ot p office evacuated for gas leak     LUNGS: no acc muscle use,  Nl  contour chest which is clear to A and P bilaterally without cough on insp or exp maneuvers   CV:  RRR  no s3 or murmur or increase in P2, and no edema     MS  nl gait, no obvious restrictions       Assessment   Asthma Onset at age 65 p pna  - 09/17/2023  After extensive coaching inhaler device,  effectiveness =    75% (short Ti)  > increase dulera to 200 2bid and continue singulair, f/u in 4 weeks with all meds   Overuse of saba p exertion so rec:  Plan A = Automatic = Always=    Dulera 200 Take 2 puffs first thing in am and then another 2 puffs about 12 hours later.   And singulair daily   Plan B = Backup (to supplement plan A, not to replace it) Only use your albuterol inhaler as a rescue medication to be used if you can't catch your breath by resting or doing a relaxed purse lip breathing pattern.  - The less you use it, the better it will work when you need it. - Ok to use the inhaler up to 2 puffs  every 4 hours if you must but call for appointment if use goes up over your usual need - Don't leave home without it !!  (think of it like the spare tire for your car)   Plan C = Crisis (instead of Plan B but only if Plan B stops working) - only use your albuterol nebulizer if you first try Plan B and it fails to help > ok to use the nebulizer up to every 4 hours but if start needing it regularly call for immediate appointment   Plan D = Doctor - call me if B and C not adequate  Re SABA :  I spent extra time with pt today reviewing appropriate use of albuterol for prn use on exertion with the following points: 1) saba is for relief of sob that does not improve by walking a slower pace or resting but rather if the pt does not improve after trying this first. 2) If the pt is convinced, as many are, that saba helps recover from activity faster then it's easy to tell if this is the case by re-challenging : ie stop, take the inhaler, then p 5 minutes try the exact same activity (intensity  of workload) that just caused the symptoms and see if they are substantially diminished or not after saba 3) if there is an activity that reproducibly causes the symptoms, try the saba 15 min before the activity on alternate days   If in fact the saba really does help, then fine to  continue to use it prn but advised may need to look closer at the maintenance regimen being used to achieve better control of airways disease with exertion.   F/u 4 weeks Adherence is always the initial "prime suspect" and is a multilayered concern that requires a "trust but verify" approach in every patient - starting with knowing how to use medications, especially inhalers, correctly, keeping up with refills and understanding the fundamental difference between maintenance and prns vs those medications only taken for a very short course and then stopped and not refilled.         Each maintenance medication was reviewed in detail including emphasizing most importantly the difference between maintenance and prns and under what circumstances the prns are to be triggered using an action plan format where appropriate.  Total time for H and P, chart review, counseling, reviewing hfa/neb device(s) and generating customized AVS unique to this office visit / same day charting = 30 min new pt eval             Sandrea Hughs, MD 09/16/2023

## 2023-09-17 ENCOUNTER — Ambulatory Visit: Payer: Medicaid Other | Admitting: Internal Medicine

## 2023-09-17 ENCOUNTER — Encounter: Payer: Self-pay | Admitting: Internal Medicine

## 2023-09-17 DIAGNOSIS — J45909 Unspecified asthma, uncomplicated: Secondary | ICD-10-CM

## 2023-09-17 MED ORDER — DULERA 200-5 MCG/ACT IN AERO
INHALATION_SPRAY | RESPIRATORY_TRACT | 11 refills | Status: DC
Start: 2023-09-17 — End: 2024-05-27

## 2023-09-17 NOTE — Assessment & Plan Note (Addendum)
Onset at age 27 p pna  - 09/17/2023  After extensive coaching inhaler device,  effectiveness =    75% (short Ti)  > increase dulera to 200 2bid and continue singulair, f/u in 4 weeks with all meds   Overuse of saba p exertion so rec:  Plan A = Automatic = Always=    Dulera 200 Take 2 puffs first thing in am and then another 2 puffs about 12 hours later.   And singulair daily   Plan B = Backup (to supplement plan A, not to replace it) Only use your albuterol inhaler as a rescue medication to be used if you can't catch your breath by resting or doing a relaxed purse lip breathing pattern.  - The less you use it, the better it will work when you need it. - Ok to use the inhaler up to 2 puffs  every 4 hours if you must but call for appointment if use goes up over your usual need - Don't leave home without it !!  (think of it like the spare tire for your car)   Plan C = Crisis (instead of Plan B but only if Plan B stops working) - only use your albuterol nebulizer if you first try Plan B and it fails to help > ok to use the nebulizer up to every 4 hours but if start needing it regularly call for immediate appointment   Plan D = Doctor - call me if B and C not adequate  Re SABA :  I spent extra time with pt today reviewing appropriate use of albuterol for prn use on exertion with the following points: 1) saba is for relief of sob that does not improve by walking a slower pace or resting but rather if the pt does not improve after trying this first. 2) If the pt is convinced, as many are, that saba helps recover from activity faster then it's easy to tell if this is the case by re-challenging : ie stop, take the inhaler, then p 5 minutes try the exact same activity (intensity of workload) that just caused the symptoms and see if they are substantially diminished or not after saba 3) if there is an activity that reproducibly causes the symptoms, try the saba 15 min before the activity on alternate days    If in fact the saba really does help, then fine to continue to use it prn but advised may need to look closer at the maintenance regimen being used to achieve better control of airways disease with exertion.   F/u 4 weeks Adherence is always the initial "prime suspect" and is a multilayered concern that requires a "trust but verify" approach in every patient - starting with knowing how to use medications, especially inhalers, correctly, keeping up with refills and understanding the fundamental difference between maintenance and prns vs those medications only taken for a very short course and then stopped and not refilled.         Each maintenance medication was reviewed in detail including emphasizing most importantly the difference between maintenance and prns and under what circumstances the prns are to be triggered using an action plan format where appropriate.  Total time for H and P, chart review, counseling, reviewing hfa/neb device(s) and generating customized AVS unique to this office visit / same day charting = 30 min new pt eval

## 2023-09-17 NOTE — Patient Instructions (Addendum)
Plan A = Automatic = Always=    Dulera 200 Take 2 puffs first thing in am and then another 2 puffs about 12 hours later.   And singulair 10 mg daily   Work on inhaler technique:  relax and gently blow all the way out then take a nice smooth full deep breath back in, triggering the inhaler at same time you start breathing in.  Hold breath in for at least  5 seconds if you can. Blow out [employer] thru nose. Rinse and gargle with water when done.  If mouth or throat bother you at all,  try brushing teeth/gums/tongue with arm and hammer toothpaste/ make a slurry and gargle and spit out.       Plan B = Backup (to supplement plan A, not to replace it) Only use your albuterol inhaler as a rescue medication to be used if you can't catch your breath by resting or doing a relaxed purse lip breathing pattern.  - The less you use it, the better it will work when you need it. - Ok to use the inhaler up to 2 puffs  every 4 hours if you must but call for appointment if use goes up over your usual need - Don't leave home without it !!  (think of it like the spare tire for your car)   Plan C = Crisis (instead of Plan B but only if Plan B stops working) - only use your albuterol nebulizer if you first try Plan B and it fails to help > ok to use the nebulizer up to every 4 hours but if start needing it regularly call for immediate appointment   Plan D = Doctor - call me if B and C not adequate  Also  Ok to try albuterol 15 min before an activity (on alternating days)  that you know would usually make you short of breath and see if it makes any difference and if makes none then don't take albuterol after activity unless you can't catch your breath as this means it's the resting that helps, not the albuterol.     Please schedule a follow up office visit in 4 weeks, sooner if needed  with all medications /inhalers/ solutions in hand so we can verify exactly what you are taking. This includes all medications from all  doctors and over the counters

## 2023-09-25 ENCOUNTER — Encounter: Payer: Self-pay | Admitting: Family Medicine

## 2023-09-25 ENCOUNTER — Ambulatory Visit: Payer: Medicaid Other | Admitting: Family Medicine

## 2023-09-25 VITALS — BP 115/77 | HR 89 | Ht 64.0 in | Wt 221.0 lb

## 2023-09-25 DIAGNOSIS — E039 Hypothyroidism, unspecified: Secondary | ICD-10-CM

## 2023-09-25 DIAGNOSIS — O99210 Obesity complicating pregnancy, unspecified trimester: Secondary | ICD-10-CM | POA: Insufficient documentation

## 2023-09-25 DIAGNOSIS — E669 Obesity, unspecified: Secondary | ICD-10-CM

## 2023-09-25 DIAGNOSIS — Z136 Encounter for screening for cardiovascular disorders: Secondary | ICD-10-CM

## 2023-09-25 DIAGNOSIS — E559 Vitamin D deficiency, unspecified: Secondary | ICD-10-CM

## 2023-09-25 MED ORDER — MYLANTA MAXIMUM STRENGTH 400-400-40 MG/5ML PO SUSP: 10.0000 mL | Freq: Four times a day (QID) | ORAL | 1 refills | Status: DC | PRN

## 2023-09-25 NOTE — Assessment & Plan Note (Signed)
Labs ordered in today's visit. Discussed the importance to start eating 3 meals a day including breakfast, drink 8 glasses of water a day ,reduce portion sizes. reduced carbohydrates limit saturated and trans fat, increase servings of vegetables and limit processed foods. Find an activity that you will enjoy and start to be active at least 5 days a week for 30 minutes each day. Keep a food journal or an activity journal to identify triggers that lead to emotional eating 

## 2023-09-25 NOTE — Patient Instructions (Signed)

## 2023-09-25 NOTE — Progress Notes (Signed)
Patient Office Visit   Subjective   Patient ID: Amy [name], female    DOB: 12-26-1995  Age: 27 y.o. MRN: [id number]  CC:  Chief Complaint  Patient presents with   Weight Loss    HPI Amy [name] 27 year old female , presents to the clinic for weight loss management. She  has a past medical history of Asthma, Family history of adverse reaction to anesthesia, Migraines, Narcolepsy, PONV (postoperative nausea and vomiting), Pregnancy (12/27/2019), PTSD (post-traumatic stress disorder), and Seasonal allergies.For the details of today's visit, please refer to assessment and plan.   HPI    Outpatient Encounter Medications as of 09/25/2023  Medication Sig   albuterol (VENTOLIN HFA) 108 (90 Base) MCG/ACT inhaler Inhale 1-2 puffs into the lungs every 6 (six) hours as needed for wheezing or shortness of breath.   alum & mag hydroxide-simeth (MYLANTA MAXIMUM STRENGTH) 400-400-40 MG/5ML suspension Take 10 mLs by mouth every 6 (six) hours as needed.   Azelastine-Fluticasone 137-50 MCG/ACT SUSP Place 1 spray into the nose every 12 (twelve) hours.   benzonatate (TESSALON) 100 MG capsule Take 1 capsule (100 mg total) by mouth 2 (two) times daily as needed for cough.   cetirizine (ZYRTEC) 10 MG tablet Take 1 tablet (10 mg total) by mouth daily.   diphenhydrAMINE (BENADRYL) 25 MG tablet Take 25 mg by mouth every 6 (six) hours as needed.   ipratropium-albuterol (DUONEB) 0.5-2.5 (3) MG/3ML SOLN Take 3 mLs by nebulization every 6 (six) hours.   methocarbamol (ROBAXIN) 500 MG tablet Take 1 tablet (500 mg total) by mouth 2 (two) times daily.   mometasone-formoterol (DULERA) 200-5 MCG/ACT AERO Inhale 2 puffs into the lungs 2 (two) times daily.   mometasone-formoterol (DULERA) 200-5 MCG/ACT AERO Take 2 puffs first thing in am and then another 2 puffs about 12 hours later.   montelukast (SINGULAIR) 10 MG tablet Take 1 tablet (10 mg total) by mouth at bedtime.   ondansetron  (ZOFRAN) 4 MG tablet Take 1 tablet (4 mg total) by mouth every 6 (six) hours.   No facility-administered encounter medications on file as of 09/25/2023.    Past Surgical History:  Procedure Laterality Date   CHOLECYSTECTOMY N/A 06/20/2020   Procedure: LAPAROSCOPIC CHOLECYSTECTOMY;  Surgeon: Franky Macho, MD;  Location: [facility];  Service: General;  Laterality: N/A;   TONSILLECTOMY     WISDOM TOOTH EXTRACTION      Review of Systems  Constitutional:  Negative for chills and fever.  Eyes:  Negative for blurred vision.  Respiratory:  Negative for shortness of breath.   Cardiovascular:  Negative for chest pain and palpitations.  Genitourinary:  Negative for dysuria.  Neurological:  Negative for dizziness and headaches.      Objective    BP 115/77   Pulse 89   Ht 5\' 4"  (1.626 m)   Wt 221 lb (100.2 kg)   SpO2 96%   BMI 37.93 kg/m   Physical Exam Vitals reviewed.  Constitutional:      General: She is not in acute distress.    Appearance: Normal appearance. She is not ill-appearing, toxic-appearing or diaphoretic.  HENT:     Head: Normocephalic.  Eyes:     General:        Right eye: No discharge.        Left eye: No discharge.     Conjunctiva/sclera: Conjunctivae normal.  Cardiovascular:     Rate and Rhythm: Normal rate.     Pulses: Normal pulses.  Heart sounds: Normal heart sounds.  Pulmonary:     Effort: Pulmonary effort is normal. No respiratory distress.     Breath sounds: Normal breath sounds.  Musculoskeletal:        General: Normal range of motion.     Cervical back: Normal range of motion.  Skin:    General: Skin is warm and dry.     Capillary Refill: Capillary refill takes less than 2 seconds.  Neurological:     Mental Status: She is alert.     Coordination: Coordination normal.     Gait: Gait normal.  Psychiatric:        Mood and Affect: Mood normal.        Behavior: Behavior normal.       Assessment & Plan:  Encounter for screening for  cardiovascular disorders -     Lipid panel -     Hemoglobin A1c -     CMP14+EGFR -     CBC with Differential/Platelet  Vitamin D deficiency -     VITAMIN D 25 Hydroxy (Vit-D Deficiency, Fractures)  Hypothyroidism, unspecified type -     TSH + free T4  Obesity (BMI 30-39.9) Assessment & Plan: Labs ordered in today's visit Discussed the importance to start eating 3 meals a day including breakfast, drink 8 glasses of water a day ,reduce portion sizes. reduced carbohydrates limit saturated and trans fat, increase servings of vegetables and limit processed foods. Find an activity that you will enjoy and start to be active at least 5 days a week for 30 minutes each day. Keep a food journal or an activity journal to identify triggers that lead to emotional eating    Other orders -     Mylanta Maximum Strength; Take 10 mLs by mouth every 6 (six) hours as needed.  Dispense: 355 mL; Refill: 1    Return in about 4 weeks (around 10/23/2023), or if symptoms worsen or fail to improve, for Weight Loss Mangment.   Cruzita Lederer Newman Nip, FNP

## 2023-09-27 LAB — CBC WITH DIFFERENTIAL/PLATELET
Basophils Absolute: 0.1 x10E3/uL (ref 0.0–0.2)
Basos: 1 %
EOS (ABSOLUTE): 0.4 x10E3/uL (ref 0.0–0.4)
Eos: 5 %
Hematocrit: 41.9 % (ref 34.0–46.6)
Hemoglobin: 13.8 g/dL (ref 11.1–15.9)
Immature Grans (Abs): 0 x10E3/uL (ref 0.0–0.1)
Immature Granulocytes: 0 %
Lymphocytes Absolute: 2.3 x10E3/uL (ref 0.7–3.1)
Lymphs: 24 %
MCH: 27.9 pg (ref 26.6–33.0)
MCHC: 32.9 g/dL (ref 31.5–35.7)
MCV: 85 fL (ref 79–97)
Monocytes Absolute: 0.6 x10E3/uL (ref 0.1–0.9)
Monocytes: 6 %
Neutrophils Absolute: 6.3 x10E3/uL (ref 1.4–7.0)
Neutrophils: 64 %
Platelets: 184 x10E3/uL (ref 150–450)
RBC: 4.94 x10E6/uL (ref 3.77–5.28)
RDW: 12.5 % (ref 11.7–15.4)
WBC: 9.8 x10E3/uL (ref 3.4–10.8)

## 2023-09-27 LAB — CMP14+EGFR
ALT: 26 [IU]/L (ref 0–32)
AST: 19 [IU]/L (ref 0–40)
Albumin: 4.3 g/dL (ref 4.0–5.0)
Alkaline Phosphatase: 91 [IU]/L (ref 44–121)
BUN/Creatinine Ratio: 20 (ref 9–23)
BUN: 17 mg/dL (ref 6–20)
Bilirubin Total: 0.4 mg/dL (ref 0.0–1.2)
CO2: 22 mmol/L (ref 20–29)
Calcium: 9 mg/dL (ref 8.7–10.2)
Chloride: 104 mmol/L (ref 96–106)
Creatinine, Ser: 0.85 mg/dL (ref 0.57–1.00)
Globulin, Total: 2.2 g/dL (ref 1.5–4.5)
Glucose: 94 mg/dL (ref 70–99)
Potassium: 4.7 mmol/L (ref 3.5–5.2)
Sodium: 140 mmol/L (ref 134–144)
Total Protein: 6.5 g/dL (ref 6.0–8.5)
eGFR: 96 mL/min/{1.73_m2} (ref >59)

## 2023-09-27 LAB — HEMOGLOBIN A1C
Est. average glucose Bld gHb Est-mCnc: 120 mg/dL
Hgb A1c MFr Bld: 5.8 % — ABNORMAL HIGH (ref 4.8–5.6)

## 2023-09-27 LAB — LIPID PANEL
Chol/HDL Ratio: 4 {ratio} (ref 0.0–4.4)
Cholesterol, Total: 190 mg/dL (ref 100–199)
HDL: 47 mg/dL (ref >39)
LDL Chol Calc (NIH): 123 mg/dL — ABNORMAL HIGH (ref 0–99)
Triglycerides: 113 mg/dL (ref 0–149)
VLDL Cholesterol Cal: 20 mg/dL (ref 5–40)

## 2023-09-27 LAB — TSH+FREE T4
Free T4: 0.78 ng/dL — ABNORMAL LOW (ref 0.82–1.77)
TSH: 1.91 u[IU]/mL (ref 0.450–4.500)

## 2023-09-27 LAB — VITAMIN D 25 HYDROXY (VIT D DEFICIENCY, FRACTURES): Vit D, 25-Hydroxy: 22.6 ng/mL — ABNORMAL LOW (ref 30.0–100.0)

## 2023-10-22 NOTE — Progress Notes (Unsigned)
Established Patient Office Visit   Subjective  Patient ID: Amy [name], female    DOB: 1996-04-09  Age: 27 y.o. MRN: [id number]  No chief complaint on file.   She  has a past medical history of Asthma, Family history of adverse reaction to anesthesia, Migraines, Narcolepsy, PONV (postoperative nausea and vomiting), Pregnancy (12/27/2019), PTSD (post-traumatic stress disorder), and Seasonal allergies.  HPI  ROS    Objective:     There were no vitals taken for this visit. {Vitals History (Optional):23777}  Physical Exam   No results found for any visits on 10/23/23.  The ASCVD Risk score (Arnett DK, et al., 2019) failed to calculate for the following reasons:   The 2019 ASCVD risk score is only valid for ages 70 to 14    Assessment & Plan:  There are no diagnoses linked to this encounter.  No follow-ups on file.   Cruzita Lederer Newman Nip, FNP

## 2023-10-22 NOTE — Patient Instructions (Signed)

## 2023-10-23 ENCOUNTER — Ambulatory Visit (INDEPENDENT_AMBULATORY_CARE_PROVIDER_SITE_OTHER): Payer: Medicaid Other | Admitting: Family Medicine

## 2023-10-23 ENCOUNTER — Encounter: Payer: Self-pay | Admitting: Family Medicine

## 2023-10-23 VITALS — BP 109/71 | HR 82 | Wt 224.0 lb

## 2023-10-23 DIAGNOSIS — Z6838 Body mass index (BMI) 38.0-38.9, adult: Secondary | ICD-10-CM

## 2023-10-23 DIAGNOSIS — E669 Obesity, unspecified: Secondary | ICD-10-CM

## 2023-10-23 MED ORDER — SEMAGLUTIDE-WEIGHT MANAGEMENT 0.25 MG/0.5ML ~~LOC~~ SOAJ
0.2500 mg | SUBCUTANEOUS | 0 refills | Status: AC
Start: 2023-10-23 — End: 2023-11-20

## 2023-10-23 NOTE — Assessment & Plan Note (Signed)
With LDL 123 Trial Wegovy 0.25 mg weekly  Continued discussion adhering to weight loss plan, with strong emphasize on nutrition and exercise. Read food labels to know how many calories are in each serving , Increase water intake 2-3 L a day. Include more protein intake such as lean meat, poultry, fish. Increase fiber intake such as vegetables, whole grains, fruits, artichokes, green peas, broccoli, lentils and lima beans.

## 2023-11-14 ENCOUNTER — Ambulatory Visit: Payer: Medicaid Other

## 2023-12-03 ENCOUNTER — Encounter: Payer: Self-pay | Admitting: Family Medicine

## 2023-12-10 ENCOUNTER — Telehealth: Payer: Self-pay | Admitting: Family Medicine

## 2023-12-10 ENCOUNTER — Ambulatory Visit
Admission: RE | Admit: 2023-12-10 | Discharge: 2023-12-10 | Disposition: A | Discharge status: Home / Self Care | Payer: Medicaid Other | Source: Ambulatory Visit | Attending: Nurse Practitioner | Admitting: Nurse Practitioner

## 2023-12-10 ENCOUNTER — Ambulatory Visit (INDEPENDENT_AMBULATORY_CARE_PROVIDER_SITE_OTHER): Payer: Medicaid Other

## 2023-12-10 ENCOUNTER — Telehealth: Payer: Self-pay | Admitting: Nurse Practitioner

## 2023-12-10 VITALS — BP 136/83 | HR 95 | Temp 97.6°F | Resp 20

## 2023-12-10 DIAGNOSIS — J4541 Moderate persistent asthma with (acute) exacerbation: Secondary | ICD-10-CM | POA: Diagnosis not present

## 2023-12-10 MED ORDER — AMOXICILLIN-POT CLAVULANATE 875-125 MG PO TABS: 1.0000 | ORAL_TABLET | Freq: Two times a day (BID) | ORAL | 0 refills | Status: DC

## 2023-12-10 MED ORDER — PREDNISONE 20 MG PO TABS
40.0000 mg | ORAL_TABLET | Freq: Every day | ORAL | 0 refills | Status: DC
Start: 2023-12-10 — End: 2023-12-16

## 2023-12-10 MED ORDER — BENZONATATE 100 MG PO CAPS: 100.0000 mg | ORAL_CAPSULE | Freq: Three times a day (TID) | ORAL | 0 refills | Status: DC | PRN

## 2023-12-10 MED ORDER — IPRATROPIUM-ALBUTEROL 0.5-2.5 (3) MG/3ML IN SOLN
3.0000 mL | Freq: Once | RESPIRATORY_TRACT | Status: AC
  Administered 2023-12-10: 3 mL via RESPIRATORY_TRACT

## 2023-12-10 MED ORDER — METHYLPREDNISOLONE ACETATE 40 MG/ML IJ SUSP
40.0000 mg | Freq: Once | INTRAMUSCULAR | Status: AC
  Administered 2023-12-10: 40 mg via INTRAMUSCULAR

## 2023-12-10 MED ORDER — DOXYCYCLINE HYCLATE 100 MG PO CAPS: 100.0000 mg | ORAL_CAPSULE | Freq: Two times a day (BID) | ORAL | 0 refills | Status: DC

## 2023-12-10 NOTE — Telephone Encounter (Signed)
Approval received.

## 2023-12-10 NOTE — ED Triage Notes (Signed)
Productive cough and wheezing since October.  States she has been using her albuterol inhaler without relief.

## 2023-12-10 NOTE — Telephone Encounter (Signed)
Chest x-ray shows pneumonia.  I called patient and discussed result.  Recommended starting antibiotic therapy - dual antibiotic therapy given history of asthma.  Treat with Augmentin twice daily for 7 days, doxycycline twice daily for 5 days.  Recommended repeat chest x-ray in 6 weeks time to ensure full resolution.  Patient verbalized understanding and all questions answered.

## 2023-12-10 NOTE — ED Provider Notes (Addendum)
[facility]    CSN: [id number] Arrival date & time: 12/10/23  1318      History   Chief Complaint Chief Complaint  Patient presents with   Wheezing    Have really bad asthma, can nearly catch my breath, main haler, and abuterol treatments are not helping - Entered by patient    HPI Amy [name] is a 27 y.o. female.   Patient presents today with 85-month history of congested cough, shortness of breath, wheezing and chest tightness, runny and stuffy nose, and fatigue.  She denies recent fever, body aches or chills, chest pain, sore throat, ear pain, headache, abdominal pain, nausea/vomiting, and diarrhea as well as decreased appetite.  Has been taking green tea with lemon and honey, Tylenol, Mucinex for symptoms as well as using albuterol inhaler 2-3 times daily without much benefit.  Reports she is taking Dulera as prescribed by pulmonology.  Has not reached out to pulmonologist yet.    Past Medical History:  Diagnosis Date   Asthma    Family history of adverse reaction to anesthesia    Migraines    Narcolepsy    PONV (postoperative nausea and vomiting)    Pregnancy 12/27/2019   PTSD (post-traumatic stress disorder)    Seasonal allergies     Patient Active Problem List   Diagnosis Date Noted   Class 2 severe obesity with serious comorbidity in adult [facility]) 09/25/2023   Ear infection 05/27/2023   Wheezing 05/27/2023   Upper respiratory infection 03/26/2023   Encounter for cervical Pap smear with pelvic exam 02/19/2023   Neck pain 02/02/2023   Vaginal discharge 02/02/2023   Acute neck pain 01/04/2023   Stress incontinence 02/15/2021   Ovarian cyst 06/30/2020   PTSD (post-traumatic stress disorder) 06/30/2020   Anxiety 06/30/2020   History of laparoscopic cholecystectomy 06/21/2020   Asthma 02/25/2019   Eczema 02/25/2019    Past Surgical History:  Procedure Laterality Date   CHOLECYSTECTOMY N/A 06/20/2020   Procedure: LAPAROSCOPIC  CHOLECYSTECTOMY;  Surgeon: Franky Macho, MD;  Location: [facility];  Service: General;  Laterality: N/A;   TONSILLECTOMY     WISDOM TOOTH EXTRACTION      OB History     Gravida  1   Para      Term      Preterm      AB      Living  1      SAB      IAB      Ectopic      Multiple      Live Births               Home Medications    Prior to Admission medications   Medication Sig Start Date End Date Taking? Authorizing Provider  benzonatate (TESSALON) 100 MG capsule Take 1 capsule (100 mg total) by mouth 3 (three) times daily as needed for cough. Do not take with alcohol or while driving or operating heavy machinery.  May cause drowsiness. 12/10/23  Yes Valentino Nose, NP  predniSONE (DELTASONE) 20 MG tablet Take 2 tablets (40 mg total) by mouth daily with breakfast for 5 days. 12/10/23 12/15/23 Yes Valentino Nose, NP  albuterol (VENTOLIN HFA) 108 (90 Base) MCG/ACT inhaler Inhale 1-2 puffs into the lungs every 6 (six) hours as needed for wheezing or shortness of breath. 01/31/23   Del Nigel Berthold, FNP  cetirizine (ZYRTEC) 10 MG tablet Take 1 tablet (10 mg total) by mouth daily. 11/19/21  Bing Neighbors, NP  diphenhydrAMINE (BENADRYL) 25 MG tablet Take 25 mg by mouth every 6 (six) hours as needed.    [provider]  ipratropium-albuterol (DUONEB) 0.5-2.5 (3) MG/3ML SOLN Take 3 mLs by nebulization every 6 (six) hours. 03/26/23   Del Newman Nip, Tenna Child, FNP  mometasone-formoterol (DULERA) 200-5 MCG/ACT AERO Inhale 2 puffs into the lungs 2 (two) times daily. 05/27/23   Del Newman Nip, Tenna Child, FNP  mometasone-formoterol (DULERA) 200-5 MCG/ACT AERO Take 2 puffs first thing in am and then another 2 puffs about 12 hours later. 09/17/23   Nyoka Cowden, MD  montelukast (SINGULAIR) 10 MG tablet Take 1 tablet (10 mg total) by mouth at bedtime. 01/31/23   Del Nigel Berthold, FNP    Family History Family History  Problem Relation Age of Onset    Diabetes Mother    Hypertension Mother    Irritable bowel syndrome Mother    Diabetes Father    Cancer Father        basal cell cancer   Cancer Maternal Uncle        brain   Crohn's disease Maternal Aunt    Heart disease Maternal Grandmother    Heart disease Maternal Grandfather    Diabetes Maternal Grandfather     Social History Social History   Tobacco Use   Smoking status: Never   Smokeless tobacco: Never  Vaping Use   Vaping status: Never Used  Substance Use Topics   Alcohol use: Not Currently    Comment: occ   Drug use: No     Allergies   Benzyl alcohol, Cough syrup [guaifenesin], Haemophilus influenzae vaccines, Other, Petrolatum, and Iodine   Review of Systems Review of Systems Per HPI  Physical Exam Triage Vital Signs ED Triage Vitals  Encounter Vitals Group     BP 12/10/23 1340 136/83     Systolic BP Percentile --      Diastolic BP Percentile --      Pulse Rate 12/10/23 1340 85     Resp 12/10/23 1340 20     Temp 12/10/23 1340 97.6 F (36.4 C)     Temp Source 12/10/23 1340 Oral     SpO2 12/10/23 1340 94 %     Weight --      Height --      Head Circumference --      Peak Flow --      Pain Score 12/10/23 1341 6     Pain Loc --      Pain Education --      Exclude from Growth Chart --    No data found.  Updated Vital Signs BP 136/83 (BP Location: Right Arm)   Pulse 95   Temp 97.6 F (36.4 C) (Oral)   Resp 20   LMP 11/26/2023 (Approximate)   SpO2 97%   Visual Acuity Right Eye Distance:   Left Eye Distance:   Bilateral Distance:    Right Eye Near:   Left Eye Near:    Bilateral Near:     Physical Exam Vitals and nursing note reviewed.  Constitutional:      General: She is not in acute distress.    Appearance: Normal appearance. She is not ill-appearing or toxic-appearing.  HENT:     Head: Normocephalic and atraumatic.     Right Ear: Tympanic membrane, ear canal and external ear normal.     Left Ear: Tympanic membrane, ear canal  and external ear normal.     Nose:  No congestion or rhinorrhea.     Mouth/Throat:     Mouth: Mucous membranes are moist.     Pharynx: Oropharynx is clear. No oropharyngeal exudate or posterior oropharyngeal erythema.  Eyes:     General: No scleral icterus.    Extraocular Movements: Extraocular movements intact.  Cardiovascular:     Rate and Rhythm: Normal rate and regular rhythm.  Pulmonary:     Effort: Pulmonary effort is normal. No respiratory distress.     Breath sounds: Decreased air movement present. Wheezing present. No rhonchi or rales.  Musculoskeletal:     Cervical back: Normal range of motion and neck supple.  Lymphadenopathy:     Cervical: No cervical adenopathy.  Skin:    General: Skin is warm and dry.     Coloration: Skin is not jaundiced or pale.     Findings: No erythema or rash.  Neurological:     Mental Status: She is alert and oriented to person, place, and time.  Psychiatric:        Behavior: Behavior is cooperative.      UC Treatments / Results  Labs (all labs ordered are listed, but only abnormal results are displayed) Labs Reviewed - No data to display  EKG   Radiology No results found.  Procedures Procedures (including critical care time)  Medications Ordered in UC Medications  ipratropium-albuterol (DUONEB) 0.5-2.5 (3) MG/3ML nebulizer solution 3 mL (3 mLs Nebulization Given 12/10/23 1405)  methylPREDNISolone acetate (DEPO-MEDROL) injection 40 mg (40 mg Intramuscular Given 12/10/23 1404)    Initial Impression / Assessment and Plan / UC Course  I have reviewed the triage vital signs and the nursing notes.  Pertinent labs & imaging results that were available during my care of the patient were reviewed by me and considered in my medical decision making (see chart for details).   Patient is well-appearing, normotensive, afebrile, not tachycardic, not tachypneic, oxygenating well on room air.    1. Moderate persistent asthma with acute  exacerbation Overall, vitals and exam are reassuring DuoNeb and Depo-Medrol given with full improvement in wheezing, lung sounds much improved To x-ray obtained to rule out pneumonia, will contact patient later if results are abnormal In meantime, start oral prednisone tomorrow, Tessalon Perles as needed to help suppress dry cough Recommended reaching out to pulmonology to discuss uncontrolled asthma and contact information provided Strict ER precautions discussed in the meantime  Addendum: Chest xray shows pneumonia, will treat with Augmentin and doxycycline given history of pneumonia.  Recommend repeat chest x-ray in 6 weeks time to ensure full resolution.  I called and discussed with patient and she verbalized understanding.    The patient was given the opportunity to ask questions.  All questions answered to their satisfaction.  The patient is in agreement to this plan.    Final Clinical Impressions(s) / UC Diagnoses   Final diagnoses:  Moderate persistent asthma with acute exacerbation     Discharge Instructions      We gave you a breathing treatment and a shot of steroid medicine today.  Start taking the oral steroids tomorrow morning.  I will contact you later today if the chest x-ray shows any abnormalities.  Continue albuterol inhaler every 4-6 hours as needed.  Continue Dulera.  Call Dr. Thurston Hole office to schedule an appointment to discuss asthma as it sounds like the [facility] is not controlling it well.  Seek care if symptoms do not improve in the meantime.     ED Prescriptions  Medication Sig Dispense Auth. Provider   predniSONE (DELTASONE) 20 MG tablet Take 2 tablets (40 mg total) by mouth daily with breakfast for 5 days. 10 tablet Cathlean Marseilles A, NP   benzonatate (TESSALON) 100 MG capsule Take 1 capsule (100 mg total) by mouth 3 (three) times daily as needed for cough. Do not take with alcohol or while driving or operating heavy machinery.  May cause drowsiness. 21  capsule Valentino Nose, NP      PDMP not reviewed this encounter.   Valentino Nose, NP 12/10/23 1424    Valentino Nose, NP 12/10/23 [phone]

## 2023-12-10 NOTE — Discharge Instructions (Signed)
We gave you a breathing treatment and a shot of steroid medicine today.  Start taking the oral steroids tomorrow morning.  I will contact you later today if the chest x-ray shows any abnormalities.  Continue albuterol inhaler every 4-6 hours as needed.  Continue Dulera.  Call Dr. Thurston Hole office to schedule an appointment to discuss asthma as it sounds like the [facility] is not controlling it well.  Seek care if symptoms do not improve in the meantime.

## 2023-12-11 ENCOUNTER — Telehealth: Payer: Self-pay

## 2023-12-11 NOTE — Telephone Encounter (Signed)
Spoke with patient.

## 2023-12-11 NOTE — Telephone Encounter (Signed)
Copied from CRM [id number]. Topic: Clinical - Medical Advice >> Dec 11, 2023 10:34 AM Amy [name] H wrote: Reason for CRM: pt wants to know if its safe to take weight loss medication, antibiotic and etc together

## 2023-12-11 NOTE — Telephone Encounter (Signed)
yes

## 2023-12-14 ENCOUNTER — Encounter (HOSPITAL_COMMUNITY): Payer: Self-pay

## 2023-12-14 ENCOUNTER — Other Ambulatory Visit: Payer: Self-pay

## 2023-12-14 ENCOUNTER — Emergency Department (HOSPITAL_COMMUNITY): Payer: Medicaid Other

## 2023-12-14 ENCOUNTER — Inpatient Hospital Stay (HOSPITAL_COMMUNITY)
Admission: EM | Admit: 2023-12-14 | Discharge: 2023-12-16 | DRG: 871 | Disposition: A | Discharge status: Home / Self Care | Payer: Medicaid Other | Attending: Internal Medicine | Admitting: Internal Medicine

## 2023-12-14 DIAGNOSIS — Z7985 Long-term (current) use of injectable non-insulin antidiabetic drugs: Secondary | ICD-10-CM

## 2023-12-14 DIAGNOSIS — E872 Acidosis, unspecified: Secondary | ICD-10-CM | POA: Diagnosis present

## 2023-12-14 DIAGNOSIS — Z833 Family history of diabetes mellitus: Secondary | ICD-10-CM

## 2023-12-14 DIAGNOSIS — Z7951 Long term (current) use of inhaled steroids: Secondary | ICD-10-CM

## 2023-12-14 DIAGNOSIS — J45901 Unspecified asthma with (acute) exacerbation: Principal | ICD-10-CM | POA: Diagnosis present

## 2023-12-14 DIAGNOSIS — Z8249 Family history of ischemic heart disease and other diseases of the circulatory system: Secondary | ICD-10-CM | POA: Diagnosis not present

## 2023-12-14 DIAGNOSIS — Z7952 Long term (current) use of systemic steroids: Secondary | ICD-10-CM | POA: Diagnosis not present

## 2023-12-14 DIAGNOSIS — Z6838 Body mass index (BMI) 38.0-38.9, adult: Secondary | ICD-10-CM

## 2023-12-14 DIAGNOSIS — R7303 Prediabetes: Secondary | ICD-10-CM | POA: Diagnosis present

## 2023-12-14 DIAGNOSIS — N898 Other specified noninflammatory disorders of vagina: Secondary | ICD-10-CM | POA: Diagnosis present

## 2023-12-14 DIAGNOSIS — E66812 Obesity, class 2: Secondary | ICD-10-CM | POA: Diagnosis present

## 2023-12-14 DIAGNOSIS — Z888 Allergy status to other drugs, medicaments and biological substances status: Secondary | ICD-10-CM

## 2023-12-14 DIAGNOSIS — A419 Sepsis, unspecified organism: Principal | ICD-10-CM | POA: Diagnosis present

## 2023-12-14 DIAGNOSIS — K219 Gastro-esophageal reflux disease without esophagitis: Secondary | ICD-10-CM | POA: Diagnosis present

## 2023-12-14 DIAGNOSIS — F4310 Post-traumatic stress disorder, unspecified: Secondary | ICD-10-CM | POA: Diagnosis present

## 2023-12-14 DIAGNOSIS — Z9049 Acquired absence of other specified parts of digestive tract: Secondary | ICD-10-CM | POA: Diagnosis not present

## 2023-12-14 DIAGNOSIS — Z79899 Other long term (current) drug therapy: Secondary | ICD-10-CM

## 2023-12-14 DIAGNOSIS — J189 Pneumonia, unspecified organism: Secondary | ICD-10-CM | POA: Diagnosis present

## 2023-12-14 DIAGNOSIS — Z808 Family history of malignant neoplasm of other organs or systems: Secondary | ICD-10-CM | POA: Diagnosis not present

## 2023-12-14 LAB — SARS CORONAVIRUS 2 BY RT PCR: SARS Coronavirus 2 by RT PCR: NEGATIVE

## 2023-12-14 LAB — BASIC METABOLIC PANEL
Anion gap: 10 (ref 5–15)
BUN: 20 mg/dL (ref 6–20)
CO2: 20 mmol/L — ABNORMAL LOW (ref 22–32)
Calcium: 8.4 mg/dL — ABNORMAL LOW (ref 8.9–10.3)
Chloride: 105 mmol/L (ref 98–111)
Creatinine, Ser: 0.99 mg/dL (ref 0.44–1.00)
GFR, Estimated: >60 mL/min (ref >60)
Glucose, Bld: 111 mg/dL — ABNORMAL HIGH (ref 70–99)
Potassium: 3.7 mmol/L (ref 3.5–5.1)
Sodium: 135 mmol/L (ref 135–145)

## 2023-12-14 LAB — CBC WITH DIFFERENTIAL/PLATELET
Abs Immature Granulocytes: 0.05 10*3/uL (ref 0.00–0.07)
Basophils Absolute: 0 10*3/uL (ref 0.0–0.1)
Basophils Relative: 0 %
Eosinophils Absolute: 0 10*3/uL (ref 0.0–0.5)
Eosinophils Relative: 1 %
HCT: 39.6 % (ref 36.0–46.0)
Hemoglobin: 13.2 g/dL (ref 12.0–15.0)
Immature Granulocytes: 1 %
Lymphocytes Relative: 17 %
Lymphs Abs: 1.3 10*3/uL (ref 0.7–4.0)
MCH: 28 pg (ref 26.0–34.0)
MCHC: 33.3 g/dL (ref 30.0–36.0)
MCV: 84.1 fL (ref 80.0–100.0)
Monocytes Absolute: 0.6 10*3/uL (ref 0.1–1.0)
Monocytes Relative: 8 %
Neutro Abs: 5.7 10*3/uL (ref 1.7–7.7)
Neutrophils Relative %: 73 %
Platelets: 174 10*3/uL (ref 150–400)
RBC: 4.71 MIL/uL (ref 3.87–5.11)
RDW: 13 % (ref 11.5–15.5)
WBC: 7.7 10*3/uL (ref 4.0–10.5)
nRBC: 0 % (ref 0.0–0.2)

## 2023-12-14 LAB — PROCALCITONIN: Procalcitonin: <0.1 ng/mL

## 2023-12-14 LAB — D-DIMER, QUANTITATIVE: D-Dimer, Quant: 0.34 ug{FEU}/mL (ref 0.00–0.50)

## 2023-12-14 LAB — PHOSPHORUS: Phosphorus: 2.4 mg/dL — ABNORMAL LOW (ref 2.5–4.6)

## 2023-12-14 LAB — HIV ANTIBODY (ROUTINE TESTING W REFLEX): HIV Screen 4th Generation wRfx: NONREACTIVE

## 2023-12-14 LAB — LACTIC ACID, PLASMA
Lactic Acid, Venous: 2.2 mmol/L (ref 0.5–1.9)
Lactic Acid, Venous: 2.6 mmol/L (ref 0.5–1.9)

## 2023-12-14 LAB — TSH: TSH: 1.273 u[IU]/mL (ref 0.350–4.500)

## 2023-12-14 LAB — MAGNESIUM: Magnesium: 1.9 mg/dL (ref 1.7–2.4)

## 2023-12-14 MED ORDER — FLUCONAZOLE 100 MG PO TABS
100.0000 mg | ORAL_TABLET | Freq: Every day | ORAL | Status: AC
  Administered 2023-12-14 – 2023-12-16 (×3): 100 mg via ORAL
  Filled 2023-12-14 (×3): qty 1

## 2023-12-14 MED ORDER — IPRATROPIUM-ALBUTEROL 0.5-2.5 (3) MG/3ML IN SOLN
3.0000 mL | Freq: Once | RESPIRATORY_TRACT | Status: AC
  Administered 2023-12-14: 3 mL via RESPIRATORY_TRACT
  Filled 2023-12-14: qty 3

## 2023-12-14 MED ORDER — ONDANSETRON HCL 4 MG/2ML IJ SOLN: 4.0000 mg | Freq: Four times a day (QID) | INTRAMUSCULAR | Status: DC | PRN

## 2023-12-14 MED ORDER — SODIUM CHLORIDE 0.9 % IV SOLN
2.0000 g | INTRAVENOUS | Status: DC
  Administered 2023-12-15 – 2023-12-16 (×2): 2 g via INTRAVENOUS
  Filled 2023-12-14 (×2): qty 20

## 2023-12-14 MED ORDER — METHYLPREDNISOLONE SODIUM SUCC 40 MG IJ SOLR
40.0000 mg | Freq: Two times a day (BID) | INTRAMUSCULAR | Status: DC
  Administered 2023-12-14 – 2023-12-16 (×5): 40 mg via INTRAVENOUS
  Filled 2023-12-14 (×5): qty 1

## 2023-12-14 MED ORDER — DM-GUAIFENESIN ER 30-600 MG PO TB12
1.0000 | ORAL_TABLET | Freq: Two times a day (BID) | ORAL | Status: DC
  Administered 2023-12-14 – 2023-12-16 (×5): 1 via ORAL
  Filled 2023-12-14 (×5): qty 1

## 2023-12-14 MED ORDER — SODIUM CHLORIDE 0.9 % IV BOLUS
1000.0000 mL | Freq: Once | INTRAVENOUS | Status: AC
  Administered 2023-12-14: 1000 mL via INTRAVENOUS

## 2023-12-14 MED ORDER — SODIUM CHLORIDE 0.9 % IV SOLN
500.0000 mg | INTRAVENOUS | Status: DC
  Administered 2023-12-15 – 2023-12-16 (×2): 500 mg via INTRAVENOUS
  Filled 2023-12-14 (×2): qty 5

## 2023-12-14 MED ORDER — ONDANSETRON HCL 4 MG PO TABS: 4.0000 mg | ORAL_TABLET | Freq: Four times a day (QID) | ORAL | Status: DC | PRN

## 2023-12-14 MED ORDER — ALBUTEROL SULFATE (2.5 MG/3ML) 0.083% IN NEBU
10.0000 mg | INHALATION_SOLUTION | Freq: Once | RESPIRATORY_TRACT | Status: AC
  Administered 2023-12-14: 10 mg via RESPIRATORY_TRACT

## 2023-12-14 MED ORDER — SODIUM CHLORIDE 0.9 % IV SOLN
500.0000 mg | Freq: Once | INTRAVENOUS | Status: AC
  Administered 2023-12-14: 500 mg via INTRAVENOUS
  Filled 2023-12-14: qty 5

## 2023-12-14 MED ORDER — ENOXAPARIN SODIUM 40 MG/0.4ML IJ SOSY
40.0000 mg | PREFILLED_SYRINGE | INTRAMUSCULAR | Status: DC
  Filled 2023-12-14 (×2): qty 0.4

## 2023-12-14 MED ORDER — PANTOPRAZOLE SODIUM 40 MG PO TBEC
40.0000 mg | DELAYED_RELEASE_TABLET | Freq: Every day | ORAL | Status: DC
  Administered 2023-12-14 – 2023-12-16 (×3): 40 mg via ORAL
  Filled 2023-12-14 (×3): qty 1

## 2023-12-14 MED ORDER — IPRATROPIUM-ALBUTEROL 0.5-2.5 (3) MG/3ML IN SOLN
3.0000 mL | Freq: Four times a day (QID) | RESPIRATORY_TRACT | Status: DC | PRN
  Administered 2023-12-14 – 2023-12-16 (×2): 3 mL via RESPIRATORY_TRACT
  Filled 2023-12-14 (×2): qty 3

## 2023-12-14 MED ORDER — ARFORMOTEROL TARTRATE 15 MCG/2ML IN NEBU
15.0000 ug | INHALATION_SOLUTION | Freq: Two times a day (BID) | RESPIRATORY_TRACT | Status: DC
  Administered 2023-12-14 – 2023-12-16 (×4): 15 ug via RESPIRATORY_TRACT
  Filled 2023-12-14 (×4): qty 2

## 2023-12-14 MED ORDER — BUDESONIDE 0.5 MG/2ML IN SUSP
0.5000 mg | Freq: Two times a day (BID) | RESPIRATORY_TRACT | Status: DC
  Administered 2023-12-14 – 2023-12-16 (×4): 0.5 mg via RESPIRATORY_TRACT
  Filled 2023-12-14 (×4): qty 2

## 2023-12-14 MED ORDER — ACETAMINOPHEN 325 MG PO TABS: 650.0000 mg | ORAL_TABLET | Freq: Four times a day (QID) | ORAL | Status: DC | PRN

## 2023-12-14 MED ORDER — MONTELUKAST SODIUM 10 MG PO TABS
10.0000 mg | ORAL_TABLET | Freq: Every day | ORAL | Status: DC
  Administered 2023-12-14 – 2023-12-15 (×2): 10 mg via ORAL
  Filled 2023-12-14 (×2): qty 1

## 2023-12-14 MED ORDER — SODIUM CHLORIDE 0.9 % IV SOLN
1.0000 g | Freq: Once | INTRAVENOUS | Status: AC
  Administered 2023-12-14: 1 g via INTRAVENOUS
  Filled 2023-12-14: qty 10

## 2023-12-14 MED ORDER — LORATADINE 10 MG PO TABS
10.0000 mg | ORAL_TABLET | Freq: Every day | ORAL | Status: DC
  Administered 2023-12-14 – 2023-12-16 (×3): 10 mg via ORAL
  Filled 2023-12-14 (×3): qty 1

## 2023-12-14 MED ORDER — CEFTRIAXONE SODIUM 1 G IJ SOLR
1.0000 g | Freq: Once | INTRAMUSCULAR | Status: AC
  Administered 2023-12-14: 1 g via INTRAVENOUS
  Filled 2023-12-14: qty 10

## 2023-12-14 MED ORDER — ACETAMINOPHEN 500 MG PO TABS
1000.0000 mg | ORAL_TABLET | Freq: Once | ORAL | Status: AC
Start: 2023-12-14 — End: 2023-12-14
  Administered 2023-12-14: 1000 mg via ORAL
  Filled 2023-12-14: qty 2

## 2023-12-14 MED ORDER — ACETAMINOPHEN 650 MG RE SUPP: 650.0000 mg | Freq: Four times a day (QID) | RECTAL | Status: DC | PRN

## 2023-12-14 NOTE — ED Notes (Signed)
Receiving nurse called for report. Receiving nurse informed of policy regarding handoff report. SBAR has been charted per policy. This Rn offered to answer any specific questions. Receiving nurse replied she can read the SBAR.

## 2023-12-14 NOTE — ED Provider Notes (Signed)
[place] EM[facility]ENCY DEPARTMENT AT [facility] Provider Note   CSN: [id number] Arrival date & time: 12/14/23  0559     History  Chief Complaint  Patient presents with   Fever    Amy [name] is a 27 y.o. female.  HPI Patient presents with concern for fever, increased work of breathing, cough, congestion.  Patient was diagnosed with pneumonia 3 days ago.  She has been taking Augmentin, doxycycline, steroids since that time.  She presents with worsening symptoms.  She was well prior to the onset of cough, does have a history of asthma.    Home Medications Prior to Admission medications   Medication Sig Start Date End Date Taking? Authorizing Provider  albuterol (VENTOLIN HFA) 108 (90 Base) MCG/ACT inhaler Inhale 1-2 puffs into the lungs every 6 (six) hours as needed for wheezing or shortness of breath. 01/31/23   Del Nigel Berthold, FNP  amoxicillin-clavulanate (AUGMENTIN) 875-125 MG tablet Take 1 tablet by mouth 2 (two) times daily for 7 days. 12/10/23 12/17/23  Valentino Nose, NP  benzonatate (TESSALON) 100 MG capsule Take 1 capsule (100 mg total) by mouth 3 (three) times daily as needed for cough. Do not take with alcohol or while driving or operating heavy machinery.  May cause drowsiness. 12/10/23   Valentino Nose, NP  cetirizine (ZYRTEC) 10 MG tablet Take 1 tablet (10 mg total) by mouth daily. 11/19/21   Bing Neighbors, NP  diphenhydrAMINE (BENADRYL) 25 MG tablet Take 25 mg by mouth every 6 (six) hours as needed.    [provider]  doxycycline (VIBRAMYCIN) 100 MG capsule Take 1 capsule (100 mg total) by mouth 2 (two) times daily for 5 days. 12/10/23 12/15/23  Valentino Nose, NP  ipratropium-albuterol (DUONEB) 0.5-2.5 (3) MG/3ML SOLN Take 3 mLs by nebulization every 6 (six) hours. 03/26/23   Del Newman Nip, Tenna Child, FNP  mometasone-formoterol (DULERA) 200-5 MCG/ACT AERO Inhale 2 puffs into the lungs 2 (two) times daily.  05/27/23   Del Newman Nip, Tenna Child, FNP  mometasone-formoterol (DULERA) 200-5 MCG/ACT AERO Take 2 puffs first thing in am and then another 2 puffs about 12 hours later. 09/17/23   Nyoka Cowden, MD  montelukast (SINGULAIR) 10 MG tablet Take 1 tablet (10 mg total) by mouth at bedtime. 01/31/23   Del Nigel Berthold, FNP  predniSONE (DELTASONE) 20 MG tablet Take 2 tablets (40 mg total) by mouth daily with breakfast for 5 days. 12/10/23 12/15/23  Valentino Nose, NP      Allergies    Benzyl alcohol, Cough syrup [guaifenesin], Haemophilus influenzae vaccines, Other, Petrolatum, and Iodine    Review of Systems   Review of Systems  Physical Exam Updated Vital Signs BP 122/61   Pulse (!) 131   Temp 99.8 F (37.7 C) (Oral)   Resp 14   Ht 5\' 4"  (1.626 m)   Wt 101.6 kg   LMP 11/26/2023 (Approximate)   SpO2 93%   BMI 38.45 kg/m  Physical Exam Vitals and nursing note reviewed.  Constitutional:      Appearance: She is well-developed. She is obese.  HENT:     Head: Normocephalic and atraumatic.  Eyes:     Conjunctiva/sclera: Conjunctivae normal.  Cardiovascular:     Rate and Rhythm: Regular rhythm. Tachycardia present.  Pulmonary:     Effort: Pulmonary effort is normal. No respiratory distress.     Breath sounds: No stridor. Wheezing present.  Abdominal:     General: There  is no distension.  Skin:    General: Skin is warm and dry.  Neurological:     Mental Status: She is alert and oriented to person, place, and time.     Cranial Nerves: No cranial nerve deficit.  Psychiatric:        Mood and Affect: Mood normal.     ED Results / Procedures / Treatments   Labs (all labs ordered are listed, but only abnormal results are displayed) Labs Reviewed  BASIC METABOLIC PANEL - Abnormal; Notable for the following components:      Result Value   CO2 20 (*)    Glucose, Bld 111 (*)    Calcium 8.4 (*)    All other components within normal limits  SARS CORONAVIRUS 2 BY RT PCR   CBC WITH DIFFERENTIAL/PLATELET  D-DIMER, QUANTITATIVE    EKG None  Radiology DG Chest 2 View Result Date: 12/14/2023 CLINICAL DATA:  Evaluate for pneumonia.  Persistent fevers. EXAM: CHEST - 2 VIEW COMPARISON:  12/10/2023 FINDINGS: Heart size and mediastinal contours appear normal. No pleural fluid or interstitial edema. Interval improvement in right middle lobe opacities noted on the previous exam. No new findings. IMPRESSION: Interval improvement in right middle lobe pneumonia. Electronically Signed   By: Signa Kell M.D.   On: 12/14/2023 08:02    Procedures Procedures    Medications Ordered in ED Medications  albuterol (PROVENTIL) (2.5 MG/3ML) 0.083% nebulizer solution 10 mg (has no administration in time range)  azithromycin (ZITHROMAX) 500 mg in sodium chloride 0.9 % 250 mL IVPB (has no administration in time range)  cefTRIAXone (ROCEPHIN) 1 g in sodium chloride 0.9 % 100 mL IVPB (has no administration in time range)  ipratropium-albuterol (DUONEB) 0.5-2.5 (3) MG/3ML nebulizer solution 3 mL (3 mLs Nebulization Given 12/14/23 0830)  sodium chloride 0.9 % bolus 1,000 mL (1,000 mLs Intravenous New Bag/Given 12/14/23 0829)  acetaminophen (TYLENOL) tablet 1,000 mg (1,000 mg Oral Given 12/14/23 0914)    ED Course/ Medical Decision Making/ A&P                                 Medical Decision Making Adult female with recent pneumonia diagnosis presents with worsening symptoms after initiation of appropriate medication 3 days ago.  Patient is awake, alert, mentating appropriately, not hypotensive.  No early evidence for sepsis, but patient meets SIRS criteria on arrival.  Patient received fluids steroids x-ray labs bronchodilators. Cardiac 120 sinus tach abnormal Pulse ox 92% 2 L nasal cannula abnormal   Amount and/or Complexity of Data Reviewed External Data Reviewed: radiology and notes.    Details: Urgent care x-ray from 4 days ago reviewed Labs: ordered. Decision-making  details documented in ED Course. Radiology: ordered and independent interpretation performed. Decision-making details documented in ED Course. ECG/medicine tests: ordered and independent interpretation performed. Decision-making details documented in ED Course.  Risk OTC drugs. Prescription drug management. Decision regarding hospitalization. Diagnosis or treatment significantly limited by social determinants of health.   9:44 AM After initial bronchodilator, I have reviewed the patient's x-ray, labs, vitals.  She continues to have oxygen requirement, her remains tachypneic, tachycardic, though with raising somewhat improved.  Patient meets SIRS criteria and has no oxygen requirement, in the context of pneumonia without substantial improvement in spite of appropriate antibiotics patient will switch to IV antibiotics, will require admission for further monitoring, management.  Patient starting continuous nebulizer therapy here, suspicion for multifactorial etiology, asthma plus infection.  D-dimer negative reassuring for low suspicion of PE, COVID-negative.   Final Clinical Impression(s) / ED Diagnoses Final diagnoses:  Asthma with severe exacerbation  Community acquired pneumonia of right middle lobe of lung   CRITICAL CARE Performed by: Gerhard Munch Total critical care time: 35 minutes Critical care time was exclusive of separately billable procedures and treating other patients. Critical care was necessary to treat or prevent imminent or life-threatening deterioration. Critical care was time spent personally by me on the following activities: development of treatment plan with patient and/or surrogate as well as nursing, discussions with consultants, evaluation of patient's response to treatment, examination of patient, obtaining history from patient or surrogate, ordering and performing treatments and interventions, ordering and review of laboratory studies, ordering and review of  radiographic studies, pulse oximetry and re-evaluation of patient's condition.    Gerhard Munch, MD 12/14/23 [phone]

## 2023-12-14 NOTE — ED Notes (Signed)
ED TO INPATIENT HANDOFF REPORT  ED Nurse Name and Phone #: [phone]  S Name/Age/Gender Amy [name] 27 y.o. female Room/Bed: APA12/APA12  Code Status   Code Status: Full Code  Home/SNF/Other Home Patient oriented to: self, place, time, and situation Is this baseline? Yes   Triage Complete: Triage complete  Chief Complaint Sepsis [facility]) [A41.9]  Triage Note Pov from home. Cc of not feeling any better. Diagnosed with pneumonia and says that she's still sick. Temp of 101 at home but did not take anything    Allergies Allergies  Allergen Reactions   Benzyl Alcohol     Burns skin   Cough Syrup [Guaifenesin] Nausea And Vomiting   Haemophilus Influenzae Vaccines     Hospitalized for 14 days asthma attack.  Allergic to ALL VACCINES   Other Nausea And Vomiting    Any coated medications   Petrolatum    Iodine Rash    Level of Care/Admitting Diagnosis ED Disposition     ED Disposition  Admit   Condition  --   Comment  Hospital Area: [facility] [[id number]]  Level of Care: Telemetry [5]  Covid Evaluation: Confirmed COVID Negative  Diagnosis: Sepsis [facility]) [[id number]]  Admitting Physician: Vassie Loll [[id number]]  Attending Physician: Vassie Loll [[id number]]  Certification:: I certify this patient will need inpatient services for at least 2 midnights  Expected Medical Readiness: 12/16/2023          B Medical/Surgery History Past Medical History:  Diagnosis Date   Asthma    Family history of adverse reaction to anesthesia    Migraines    Narcolepsy    PONV (postoperative nausea and vomiting)    Pregnancy 12/27/2019   PTSD (post-traumatic stress disorder)    Seasonal allergies    Past Surgical History:  Procedure Laterality Date   CHOLECYSTECTOMY N/A 06/20/2020   Procedure: LAPAROSCOPIC CHOLECYSTECTOMY;  Surgeon: Franky Macho, MD;  Location: [facility];  Service: General;  Laterality: N/A;   TONSILLECTOMY     WISDOM TOOTH EXTRACTION       A IV  Location/Drains/Wounds Patient Lines/Drains/Airways Status     Active Line/Drains/Airways     Name Placement date Placement time Site Days   Peripheral IV 12/14/23 22 G Left;Posterior Hand 12/14/23  0825  Hand  less than 1            Intake/Output Last 24 hours  Intake/Output Summary (Last 24 hours) at 12/14/2023 1144 Last data filed at 12/14/2023 1037 Gross per 24 hour  Intake 1340 ml  Output --  Net 1340 ml    Labs/Imaging Results for orders placed or performed during the hospital encounter of 12/14/23 (from the past 48 hours)  CBC with Differential     Status: None   Collection Time: 12/14/23  8:03 AM  Result Value Ref Range   WBC 7.7 4.0 - 10.5 K/uL   RBC 4.71 3.87 - 5.11 MIL/uL   Hemoglobin 13.2 12.0 - 15.0 g/dL   HCT 29.5 62.1 - 30.8 %   MCV 84.1 80.0 - 100.0 fL   MCH 28.0 26.0 - 34.0 pg   MCHC 33.3 30.0 - 36.0 g/dL   RDW 65.7 84.6 - 96.2 %   Platelets 174 150 - 400 K/uL   nRBC 0.0 0.0 - 0.2 %   Neutrophils Relative % 73 %   Neutro Abs 5.7 1.7 - 7.7 K/uL   Lymphocytes Relative 17 %   Lymphs Abs 1.3 0.7 - 4.0 K/uL   Monocytes Relative 8 %  Monocytes Absolute 0.6 0.1 - 1.0 K/uL   Eosinophils Relative 1 %   Eosinophils Absolute 0.0 0.0 - 0.5 K/uL   Basophils Relative 0 %   Basophils Absolute 0.0 0.0 - 0.1 K/uL   Immature Granulocytes 1 %   Abs Immature Granulocytes 0.05 0.00 - 0.07 K/uL    Comment: Performed at [facility], [address] [place], [place] [address]  Basic metabolic panel     Status: Abnormal   Collection Time: 12/14/23  8:03 AM  Result Value Ref Range   Sodium 135 135 - 145 mmol/L   Potassium 3.7 3.5 - 5.1 mmol/L   Chloride 105 98 - 111 mmol/L   CO2 20 (L) 22 - 32 mmol/L   Glucose, Bld 111 (H) 70 - 99 mg/dL    Comment: Glucose reference range applies only to samples taken after fasting for at least 8 hours.   BUN 20 6 - 20 mg/dL   Creatinine, Ser 0.16 0.44 - 1.00 mg/dL   Calcium 8.4 (L) 8.9 - 10.3 mg/dL   GFR, Estimated >01 >09  mL/min    Comment: (NOTE) Calculated using the CKD-EPI Creatinine Equation (2021)    Anion gap 10 5 - 15    Comment: Performed at [facility], [address], [place], [place] [address]  SARS Coronavirus 2 by RT PCR (hospital order, performed in [facility] hospital lab) *cepheid single result test* Anterior Nasal Swab     Status: None   Collection Time: 12/14/23  8:21 AM   Specimen: Anterior Nasal Swab  Result Value Ref Range   SARS Coronavirus 2 by RT PCR NEGATIVE NEGATIVE    Comment: (NOTE) SARS-CoV-2 target nucleic acids are NOT DETECTED.  The SARS-CoV-2 RNA is generally detectable in upper and lower respiratory specimens during the acute phase of infection. The lowest concentration of SARS-CoV-2 viral copies this assay can detect is 250 copies / mL. A negative result does not preclude SARS-CoV-2 infection and should not be used as the sole basis for treatment or other patient management decisions.  A negative result may occur with improper specimen collection / handling, submission of specimen other than nasopharyngeal swab, presence of viral mutation(s) within the areas targeted by this assay, and inadequate number of viral copies (<250 copies / mL). A negative result must be combined with clinical observations, patient history, and epidemiological information.  Fact Sheet for Patients:   RoadLapTop.co.za  Fact Sheet for Healthcare Providers: http://kim-miller.com/  This test is not yet approved or  cleared by the [place] FDA and has been authorized for detection and/or diagnosis of SARS-CoV-2 by FDA under an Emergency Use Authorization (EUA).  This EUA will remain in effect (meaning this test can be used) for the duration of the COVID-19 declaration under Section 564(b)(1) of the Act, 21 U.S.C. section 360bbb-3(b)(1), unless the authorization is terminated or revoked sooner.  Performed at [facility], [address] [place], [place] [address]   D-dimer, quantitative     Status: None   Collection Time: 12/14/23  8:55 AM  Result Value Ref Range   D-Dimer, Quant 0.34 0.00 - 0.50 ug/mL-FEU    Comment: (NOTE) At the manufacturer cut-off value of 0.5 g/mL FEU, this assay has a negative predictive value of 95-100%.This assay is intended for use in conjunction with a clinical pretest probability (PTP) assessment model to exclude pulmonary embolism (PE) and deep venous thrombosis (DVT) in outpatients suspected of PE or DVT. Results should be correlated with clinical presentation. Performed at  [facility], [address], [place], [place] [address]   Culture, blood (routine x 2) Call MD if unable to obtain prior to antibiotics being given     Status: None (Preliminary result)   Collection Time: 12/14/23 10:49 AM   Specimen: BLOOD  Result Value Ref Range   Specimen Description BLOOD BLOOD RIGHT ARM    Special Requests      Blood Culture adequate volume BOTTLES DRAWN AEROBIC AND ANAEROBIC Performed at [facility], [address] [place], [place] [address]    Culture PENDING    Report Status PENDING   Culture, blood (routine x 2) Call MD if unable to obtain prior to antibiotics being given     Status: None (Preliminary result)   Collection Time: 12/14/23 10:49 AM   Specimen: BLOOD  Result Value Ref Range   Specimen Description BLOOD BLOOD RIGHT HAND    Special Requests      Blood Culture adequate volume BOTTLES DRAWN AEROBIC AND ANAEROBIC Performed at [facility], [address], [place], [place] [address]    Culture PENDING    Report Status PENDING   Phosphorus     Status: Abnormal   Collection Time: 12/14/23 10:49 AM  Result Value Ref Range   Phosphorus 2.4 (L) 2.5 - 4.6 mg/dL    Comment: Performed at [facility], [address], [place], [place] [address]  Magnesium     Status: None   Collection Time: 12/14/23 10:49 AM  Result Value Ref Range   Magnesium 1.9 1.7 - 2.4 mg/dL    Comment:  Performed at [facility], [address] [place], [place] [address]  TSH     Status: None   Collection Time: 12/14/23 10:49 AM  Result Value Ref Range   TSH 1.273 0.350 - 4.500 uIU/mL    Comment: Performed by a 3rd Generation assay with a functional sensitivity of <=0.01 uIU/mL. Performed at [facility], [address] [place], [place] [address]   Lactic acid, plasma     Status: Abnormal   Collection Time: 12/14/23 10:52 AM  Result Value Ref Range   Lactic Acid, Venous 2.2 (HH) 0.5 - 1.9 mmol/L    Comment: CRITICAL RESULT CALLED TO, READ BACK BY AND VERIFIED WITH A. Vickii Volland ON 12/14/2023 @11 :24 BY T.HAMER. Performed at [facility], [address] [place], [place] [address]    DG Chest 2 View Result Date: 12/14/2023 CLINICAL DATA:  Evaluate for pneumonia.  Persistent fevers. EXAM: CHEST - 2 VIEW COMPARISON:  12/10/2023 FINDINGS: Heart size and mediastinal contours appear normal. No pleural fluid or interstitial edema. Interval improvement in right middle lobe opacities noted on the previous exam. No new findings. IMPRESSION: Interval improvement in right middle lobe pneumonia. Electronically Signed   By: Signa Kell M.D.   On: 12/14/2023 08:02    Pending Labs Unresulted Labs (From admission, onward)     Start     Ordered   12/21/23 0500  Creatinine, serum  (enoxaparin (LOVENOX)    CrCl >/= 30 ml/min)  Weekly,   R     Comments: while on enoxaparin therapy    12/14/23 1018   12/15/23 0500  Basic metabolic panel  Tomorrow morning,   R        12/14/23 1018   12/15/23 0500  CBC  Tomorrow morning,   R        12/14/23 1018   12/14/23 1021  Lactic acid, plasma  (Lactic Acid)  STAT Now then every 3 hours,  R (with STAT occurrences)      12/14/23 1021   12/14/23 1021  Procalcitonin  Once,   R       References:    Procalcitonin Lower Respiratory Tract Infection AND Sepsis Procalcitonin Algorithm   12/14/23 1021   12/14/23 1016  Legionella Pneumophila Serogp 1 Ur Ag  (COPD / Pneumonia  / Cellulitis / Lower Extremity Wound)  Once,   R        12/14/23 1018   12/14/23 1016  Strep pneumoniae urinary antigen  (COPD / Pneumonia / Cellulitis / Lower Extremity Wound)  Once,   R        12/14/23 1018   12/14/23 1016  Expectorated Sputum Assessment w Gram Stain, Rflx to Resp Cult  (COPD / Pneumonia / Cellulitis / Lower Extremity Wound)  Once,   R        12/14/23 1018   12/14/23 1013  HIV Antibody (routine testing w rflx)  (HIV Antibody (Routine testing w reflex) panel)  Once,   R        12/14/23 1018            Vitals/Pain Today's Vitals   12/14/23 0831 12/14/23 0925 12/14/23 0955 12/14/23 1041  BP:  122/61    Pulse:  (!) 131    Resp: (!) 22 14    Temp: 99.8 F (37.7 C)     TempSrc: Oral     SpO2:  93%  94%  Weight:      Height:      PainSc:   6      Isolation Precautions Airborne and Contact precautions  Medications Medications  cefTRIAXone (ROCEPHIN) 2 g in sodium chloride 0.9 % 100 mL IVPB (has no administration in time range)  azithromycin (ZITHROMAX) 500 mg in sodium chloride 0.9 % 250 mL IVPB (has no administration in time range)  enoxaparin (LOVENOX) injection 40 mg (has no administration in time range)  acetaminophen (TYLENOL) tablet 650 mg (has no administration in time range)    Or  acetaminophen (TYLENOL) suppository 650 mg (has no administration in time range)  ondansetron (ZOFRAN) tablet 4 mg (has no administration in time range)    Or  ondansetron (ZOFRAN) injection 4 mg (has no administration in time range)  pantoprazole (PROTONIX) EC tablet 40 mg (40 mg Oral Given 12/14/23 1047)  methylPREDNISolone sodium succinate (SOLU-MEDROL) 40 mg/mL injection 40 mg (40 mg Intravenous Given 12/14/23 1048)  budesonide (PULMICORT) nebulizer solution 0.5 mg (0.5 mg Nebulization Not Given 12/14/23 1043)  arformoterol (BROVANA) nebulizer solution 15 mcg (15 mcg Nebulization Not Given 12/14/23 1042)  dextromethorphan-guaiFENesin (MUCINEX DM) 30-600 MG per 12 hr  tablet 1 tablet (1 tablet Oral Given 12/14/23 1047)  ipratropium-albuterol (DUONEB) 0.5-2.5 (3) MG/3ML nebulizer solution 3 mL (has no administration in time range)  loratadine (CLARITIN) tablet 10 mg (10 mg Oral Given 12/14/23 1047)  montelukast (SINGULAIR) tablet 10 mg (has no administration in time range)  cefTRIAXone (ROCEPHIN) 1 g in sodium chloride 0.9 % 100 mL IVPB (has no administration in time range)  ipratropium-albuterol (DUONEB) 0.5-2.5 (3) MG/3ML nebulizer solution 3 mL (3 mLs Nebulization Given 12/14/23 0830)  sodium chloride 0.9 % bolus 1,000 mL (0 mLs Intravenous Stopped 12/14/23 1031)  acetaminophen (TYLENOL) tablet 1,000 mg (1,000 mg Oral Given 12/14/23 0914)  albuterol (PROVENTIL) (2.5 MG/3ML) 0.083% nebulizer solution 10 mg (10 mg Nebulization Given 12/14/23 1041)  azithromycin (ZITHROMAX) 500 mg in sodium chloride 0.9 % 250 mL IVPB (500 mg Intravenous New Bag/Given 12/14/23  1031)  cefTRIAXone (ROCEPHIN) 1 g in sodium chloride 0.9 % 100 mL IVPB (0 g Intravenous Stopped 12/14/23 1032)    Mobility walks     Focused Assessments    R Recommendations: See Admitting Provider Note  Report given to:   Additional Notes:

## 2023-12-14 NOTE — ED Triage Notes (Signed)
Pov from home. Cc of not feeling any better. Diagnosed with pneumonia and says that she's still sick. Temp of 101 at home but did not take anything

## 2023-12-14 NOTE — ED Notes (Signed)
Blood cultures were drawn after abx were started due to late onset meeting of sepsis criteria. MD aware. Orders to proceed forward.

## 2023-12-14 NOTE — ED Notes (Signed)
Messaged MD about pt vitals and o2 requirement.

## 2023-12-14 NOTE — H&P (Signed)
History and Physical    Patient: Amy [name] ZYS:[id number] DOB: 01-Jul-1996 DOA: 12/14/2023 DOS: the patient was seen and examined on 12/14/2023 PCP: Rica Records, FNP  Patient coming from: Home  Chief Complaint:  Chief Complaint  Patient presents with   Fever   HPI: Amy [name] is a 27 y.o. female with medical history significant of asthma, prediabetes, PTSD, GERD, class II obesity and seasonal allergies; who presented to the emergency department secondary to fever, productive coughing spells and shortness of breath.  Patient reports symptom has been present for the last 4-5 days and worsening.  She was seen at her PCP office and diagnosed with pneumonia and asthma exacerbation; antibiotics and prednisone were prescribed but patient has failed to improve and decided to seek attention at the hospital.  Of note, patient reports after initiating antibiotics as an outpatient she has noticed some whitish discharges from her vagina.  Patient denies chest pain, bleeding, focal weaknesses, hematuria, melena, hematochezia, sick contacts or any other complaints.  Workup in the ED demonstrating sepsis secondary to pneumonia low normal oxygen saturation.  Patient may criteria for sepsis with elevated tachycardia, tachypnea, fever, elevated lactic acid and identified source of infection (only on chest x-ray).  Cultures taken, antibiotics started and bronchodilator management provided.  TRH consulted to place patient in the hospital for further evaluation and management.   Review of Systems: As mentioned in the history of present illness. All other systems reviewed and are negative. Past Medical History:  Diagnosis Date   Asthma    Family history of adverse reaction to anesthesia    Migraines    Narcolepsy    PONV (postoperative nausea and vomiting)    Pregnancy 12/27/2019   PTSD (post-traumatic stress disorder)    Seasonal allergies    Past Surgical  History:  Procedure Laterality Date   CHOLECYSTECTOMY N/A 06/20/2020   Procedure: LAPAROSCOPIC CHOLECYSTECTOMY;  Surgeon: Franky Macho, MD;  Location: [facility];  Service: General;  Laterality: N/A;   TONSILLECTOMY     WISDOM TOOTH EXTRACTION     Social History:  reports that she has never smoked. She has never used smokeless tobacco. She reports that she does not currently use alcohol. She reports that she does not use drugs.  Allergies  Allergen Reactions   Benzyl Alcohol     Burns skin   Cough Syrup [Guaifenesin] Nausea And Vomiting   Haemophilus Influenzae Vaccines     Hospitalized for 14 days asthma attack.  Allergic to ALL VACCINES   Other Nausea And Vomiting    Any coated medications   Petrolatum    Iodine Rash    Family History  Problem Relation Age of Onset   Diabetes Mother    Hypertension Mother    Irritable bowel syndrome Mother    Diabetes Father    Cancer Father        basal cell cancer   Cancer Maternal Uncle        brain   Crohn's disease Maternal Aunt    Heart disease Maternal Grandmother    Heart disease Maternal Grandfather    Diabetes Maternal Grandfather     Prior to Admission medications   Medication Sig Start Date End Date Taking? Authorizing Provider  albuterol (VENTOLIN HFA) 108 (90 Base) MCG/ACT inhaler Inhale 1-2 puffs into the lungs every 6 (six) hours as needed for wheezing or shortness of breath. 01/31/23  Yes Del Newman Nip, Tenna Child, FNP  amoxicillin-clavulanate (AUGMENTIN) 875-125 MG tablet Take  1 tablet by mouth 2 (two) times daily for 7 days. 12/10/23 12/17/23 Yes Valentino Nose, NP  benzonatate (TESSALON) 100 MG capsule Take 1 capsule (100 mg total) by mouth 3 (three) times daily as needed for cough. Do not take with alcohol or while driving or operating heavy machinery.  May cause drowsiness. 12/10/23  Yes Valentino Nose, NP  cetirizine (ZYRTEC) 10 MG tablet Take 1 tablet (10 mg total) by mouth daily. 11/19/21  Yes Bing Neighbors, NP  diphenhydrAMINE (BENADRYL) 25 MG tablet Take 25 mg by mouth every morning.   Yes [provider]  doxycycline (VIBRAMYCIN) 100 MG capsule Take 1 capsule (100 mg total) by mouth 2 (two) times daily for 5 days. 12/10/23 12/15/23 Yes Cathlean Marseilles A, NP  ipratropium-albuterol (DUONEB) 0.5-2.5 (3) MG/3ML SOLN Take 3 mLs by nebulization every 6 (six) hours. 03/26/23  Yes Del Newman Nip, Tenna Child, FNP  mometasone-formoterol (DULERA) 200-5 MCG/ACT AERO Inhale 2 puffs into the lungs 2 (two) times daily. 05/27/23  Yes Del Newman Nip, Tenna Child, FNP  montelukast (SINGULAIR) 10 MG tablet Take 1 tablet (10 mg total) by mouth at bedtime. 01/31/23  Yes Del Newman Nip, Tenna Child, FNP  predniSONE (DELTASONE) 20 MG tablet Take 2 tablets (40 mg total) by mouth daily with breakfast for 5 days. 12/10/23 12/15/23 Yes Valentino Nose, NP  WEGOVY 0.25 MG/0.5ML SOAJ Inject 0.25 mg into the skin every Wednesday. 12/04/23  Yes [provider]  mometasone-formoterol (DULERA) 200-5 MCG/ACT AERO Take 2 puffs first thing in am and then another 2 puffs about 12 hours later. Patient not taking: Reported on 12/14/2023 09/17/23   Nyoka Cowden, MD    Physical Exam: Vitals:   12/14/23 1200 12/14/23 1232 12/14/23 1250 12/14/23 1422  BP: 121/74  119/81   Pulse: (!) 131  (!) 110   Resp: 18  20   Temp:  97.7 F (36.5 C) 98.3 F (36.8 C)   TempSrc:  Axillary Oral   SpO2: 94%  97% 92%  Weight:      Height:       General exam: Alert, awake, oriented x 3; reporting intermittent coughing spells and having difficulty speaking in full sentences. Respiratory system: Fair air movement bilaterally; tachypnea, rhonchi at and diffuse expiratory wheezing appreciated. Cardiovascular system: Sinus tachycardia, no rubs, no gallops, no JVD. Gastrointestinal system: Abdomen is obese, nondistended, soft and nontender. No organomegaly or masses felt. Normal bowel sounds heard. Central nervous system: No focal  neurological deficits. Extremities: No cyanosis or clubbing. Skin: No petechiae. Psychiatry: Judgement and insight appear normal. Mood & affect appropriate.   Data Reviewed: COVID PCR: Negative D-dimer: 0.34 Basic metabolic panel: Sodium 135, potassium 3.7, chloride 105, bicarb 20, BUN 20, creatinine 0.99 and GFR >60 CBC: White blood cell 7.7, hemoglobin 13.2 and platelet count 174K Lactic acid 2.6  Assessment and Plan: 1-sepsis secondary to community-acquired pneumonia -Patient met criteria time of admission with elevated heart rate, tachypnea, high temperature, elevated lactic acid and identified source of infection. -IV antibiotics has been initiated -Fluid resuscitation provide -Will check procalcitonin level, follow culture results and provide supportive care.  2-asthma exacerbation -Steroids and bronchodilator management has been initiated -Flutter valve and mucolytic has been added -As needed oxygen supplementation will be provided.  3-lactic acidosis -In the setting of sepsis from pneumonia and the use of albuterol most likely -Fluid resuscitation provide -Patient has been started on antibiotics -follow clinical response.  4-vaginal discharge -Most likely associated with fungal infection after initiating  antibiotic therapy -Will provide treatment with fluconazole.  5-gastroesophageal flux disease -Started on PPI  6-class II obesity --Body mass index is 38.45 kg/m.  -Low-calorie diet, portion control and increase physical activity discussed with patient.  7-seasonal allergies -Continue loratadine.    Advance Care Planning:   Code Status: Full Code   Consults: None  Family Communication: No family at bedside.  Severity of Illness: The appropriate patient status for this patient is INPATIENT. Inpatient status is judged to be reasonable and necessary in order to provide the required intensity of service to ensure the patient's safety. The patient's presenting  symptoms, physical exam findings, and initial radiographic and laboratory data in the context of their chronic comorbidities is felt to place them at high risk for further clinical deterioration. Furthermore, it is not anticipated that the patient will be medically stable for discharge from the hospital within 2 midnights of admission.   * I certify that at the point of admission it is my clinical judgment that the patient will require inpatient hospital care spanning beyond 2 midnights from the point of admission due to high intensity of service, high risk for further deterioration and high frequency of surveillance required.*  Author: Vassie Loll, MD 12/14/2023 6:08 PM  For on call review [email or web address].

## 2023-12-15 DIAGNOSIS — J45901 Unspecified asthma with (acute) exacerbation: Secondary | ICD-10-CM | POA: Diagnosis not present

## 2023-12-15 DIAGNOSIS — J189 Pneumonia, unspecified organism: Secondary | ICD-10-CM | POA: Diagnosis not present

## 2023-12-15 DIAGNOSIS — A419 Sepsis, unspecified organism: Secondary | ICD-10-CM | POA: Diagnosis not present

## 2023-12-15 LAB — BASIC METABOLIC PANEL
Anion gap: 12 (ref 5–15)
BUN: 12 mg/dL (ref 6–20)
CO2: 19 mmol/L — ABNORMAL LOW (ref 22–32)
Calcium: 9 mg/dL (ref 8.9–10.3)
Chloride: 107 mmol/L (ref 98–111)
Creatinine, Ser: 0.64 mg/dL (ref 0.44–1.00)
GFR, Estimated: >60 mL/min (ref >60)
Glucose, Bld: 153 mg/dL — ABNORMAL HIGH (ref 70–99)
Potassium: 4.2 mmol/L (ref 3.5–5.1)
Sodium: 138 mmol/L (ref 135–145)

## 2023-12-15 LAB — CBC
HCT: 42.7 % (ref 36.0–46.0)
Hemoglobin: 14 g/dL (ref 12.0–15.0)
MCH: 27.7 pg (ref 26.0–34.0)
MCHC: 32.8 g/dL (ref 30.0–36.0)
MCV: 84.6 fL (ref 80.0–100.0)
Platelets: 206 10*3/uL (ref 150–400)
RBC: 5.05 MIL/uL (ref 3.87–5.11)
RDW: 13.2 % (ref 11.5–15.5)
WBC: 7.6 10*3/uL (ref 4.0–10.5)
nRBC: 0 % (ref 0.0–0.2)

## 2023-12-15 NOTE — Progress Notes (Signed)
   12/15/23 1342  TOC Brief Assessment  Insurance and Status Reviewed  Patient has primary care physician Yes  Home environment has been reviewed From home  Prior level of function: Independent  Prior/Current Home Services No current home services  Social Drivers of Health Review SDOH reviewed no interventions necessary  Readmission risk has been reviewed Yes  Transition of care needs no transition of care needs at this time     Transition of Care Department [facility]) has reviewed patient and no TOC needs have been identified at this time. We will continue to monitor patient advancement through interdisciplinary progression rounds. If new patient transition needs arise, please place a TOC consult.

## 2023-12-15 NOTE — Progress Notes (Signed)
Progress Note   Patient: Amy [name] ZOX:[id number] DOB: 06-25-1996 DOA: 12/14/2023     1 DOS: the patient was seen and examined on 12/15/2023   Brief hospital admission course: Amy [name] is a 27 y.o. female with medical history significant of asthma, prediabetes, PTSD, GERD, class II obesity and seasonal allergies; who presented to the emergency department secondary to fever, productive coughing spells and shortness of breath.  Patient reports symptom has been present for the last 4-5 days and worsening.  She was seen at her PCP office and diagnosed with pneumonia and asthma exacerbation; antibiotics and prednisone were prescribed but patient has failed to improve and decided to seek attention at the hospital.   Of note, patient reports after initiating antibiotics as an outpatient she has noticed some whitish discharges from her vagina.   Patient denies chest pain, bleeding, focal weaknesses, hematuria, melena, hematochezia, sick contacts or any other complaints.   Workup in the ED demonstrating sepsis secondary to pneumonia low normal oxygen saturation.  Patient may criteria for sepsis with elevated tachycardia, tachypnea, fever, elevated lactic acid and identified source of infection (only on chest x-ray).  Cultures taken, antibiotics started and bronchodilator management provided.  TRH consulted to place patient in the hospital for further evaluation and management.    Assessment and Plan: Sepsis secondary to community-acquired pneumonia -Follow culture results and continue current antibiotics -Patient received fluid resuscitation  -responding adequately to treatment. -Flutter valve has been recommended -Continue as needed mucolytic's and antipyretics. -If patient remains afebrile for 24 hours will be able to be discharged with oral antibiotics and the steroids tapering.  Lactic acidosis -In the setting albuterol usage and sepsis physiology -Continue  to maintain adequate hydration. -Fluid resuscitation provided at time of admission.  Asthma exacerbation -Continue treatment with steroids and bronchodilator treatment -No oxygen supplementation currently required  Vaginal discharge/fungal infection -Continue treatment with Diflucan -Patient reports improvement.  Seasonal allergies -Continue loratadine.  Class II obesity -Body mass index is 38.45 kg/m.  -Low-calorie diet and portion control discussed with patient.  GERD -Continue PPI. -Lifestyle changes discussed with patient.   Subjective:  Low-grade temperature appreciated overnight; no chest pain, no nausea or vomiting at the moment.  Overall feeling better and not requiring oxygen supplementation.  Physical Exam: Vitals:   12/15/23 0731 12/15/23 0737 12/15/23 1000 12/15/23 1413  BP:    132/75  Pulse:    83  Resp:   19   Temp:    (!) 97.5 F (36.4 C)  TempSrc:    Oral  SpO2: 96% 96%  95%  Weight:      Height:       General exam: Alert, awake, oriented x 3, chronically obese; no chest pain, no nausea, no vomiting. Respiratory system: Able to speak in full sentences on today's examination; short winded with activity, positive expiratory wheezing and bilateral rhonchi appreciated.  No using accessory muscles. Cardiovascular system:RRR. No rubs or gallops. Gastrointestinal system: Abdomen is obese, nondistended, soft and nontender. No organomegaly or masses felt. Normal bowel sounds heard. Central nervous system:  No focal neurological deficits. Extremities: No cyanosis or clubbing. Skin: No petechiae. Psychiatry: Judgement and insight appear normal. Mood & affect appropriate.   Data Reviewed: Basic metabolic panel: Sodium 138, potassium 4.2, chloride 107, bicarb 19, BUN 12, creatinine 0.64, GFR >60 CBC: WBC 7.6, hemoglobin 14.0 and platelet count 206K   Family Communication: Husband updated at bedside.  Disposition: Status is: Inpatient Remains inpatient  appropriate because: Continue  IV antibiotics.   Planned Discharge Destination: Home  Time spent: 50 minutes  Author: Vassie Loll, MD 12/15/2023 4:44 PM  For on call review [email or web address].

## 2023-12-15 NOTE — Plan of Care (Signed)

## 2023-12-16 DIAGNOSIS — A419 Sepsis, unspecified organism: Secondary | ICD-10-CM | POA: Diagnosis not present

## 2023-12-16 LAB — EXPECTORATED SPUTUM ASSESSMENT W GRAM STAIN, RFLX TO RESP C

## 2023-12-16 LAB — STREP PNEUMONIAE URINARY ANTIGEN: Strep Pneumo Urinary Antigen: NEGATIVE

## 2023-12-16 MED ORDER — AZITHROMYCIN 500 MG PO TABS: 500.0000 mg | ORAL_TABLET | Freq: Every day | ORAL | 0 refills | Status: AC

## 2023-12-16 MED ORDER — PREDNISONE 10 MG PO TABS: 40.0000 mg | ORAL_TABLET | Freq: Every day | ORAL | 0 refills | Status: AC

## 2023-12-16 MED ORDER — CEFDINIR 300 MG PO CAPS: 300.0000 mg | ORAL_CAPSULE | Freq: Two times a day (BID) | ORAL | 0 refills | Status: AC

## 2023-12-16 MED ORDER — DM-GUAIFENESIN ER 30-600 MG PO TB12: 1.0000 | ORAL_TABLET | Freq: Two times a day (BID) | ORAL | 0 refills | Status: AC

## 2023-12-16 NOTE — Discharge Summary (Signed)
Physician Discharge Summary  Amy [name] VHQ:[id number] DOB: November 03, 1996 DOA: 12/14/2023  PCP: Rica Records, FNP  Admit date: 12/14/2023  Discharge date: 12/16/2023  Admitted From:Home  Disposition:  Home  Recommendations for Outpatient Follow-up:  Follow up with PCP in 1-2 weeks Follow-up with pulmonology outpatient Continue breathing treatments as needed for shortness of breath or wheezing Continue antibiotics and prednisone taper as prescribed Continue other home medications  Home Health: None  Equipment/Devices: Home nebulizer machine  Discharge Condition:Stable  CODE STATUS: Full  Diet recommendation: Heart Healthy/carb modified  Brief/Interim Summary: Amy [name] is a 27 y.o. female with medical history significant of asthma, prediabetes, PTSD, GERD, class II obesity and seasonal allergies; who presented to the emergency department secondary to fever, productive coughing spells and shortness of breath.  Patient reports symptom has been present for the last 4-5 days and worsening.  She was seen at her PCP office and diagnosed with pneumonia and asthma exacerbation; antibiotics and prednisone were prescribed but patient has failed to improve and decided to seek attention at the hospital.   Patient was admitted with sepsis, POA with community-acquired pneumonia and has now improved over the course of the last couple days and is in stable condition for discharge.  She will remain on oral antibiotics to complete her course of treatment as well as oral prednisone and has home breathing treatments.  No other acute events or concerns noted.  Discharge Diagnoses:  Principal Problem:   Sepsis (HCC)  Principal discharge diagnosis: Sepsis, POA secondary to community-acquired pneumonia.  Discharge Instructions  Discharge Instructions     Diet - low sodium heart healthy   Complete by: As directed    For home use only DME Nebulizer  machine   Complete by: As directed    Patient needs a nebulizer to treat with the following condition: Asthma   Length of Need: Lifetime   Additional equipment included:  Administration kit Filter     Increase activity slowly   Complete by: As directed       Allergies as of 12/16/2023       Reactions   Benzyl Alcohol    Burns skin   Cough Syrup [guaifenesin] Nausea And Vomiting   Haemophilus Influenzae Vaccines    Hospitalized for 14 days asthma attack.  Allergic to ALL VACCINES   Other Nausea And Vomiting   Any coated medications   Petrolatum    Iodine Rash        Medication List     STOP taking these medications    amoxicillin-clavulanate 875-125 MG tablet Commonly known as: AUGMENTIN   doxycycline 100 MG capsule Commonly known as: VIBRAMYCIN       TAKE these medications    albuterol 108 (90 Base) MCG/ACT inhaler Commonly known as: VENTOLIN HFA Inhale 1-2 puffs into the lungs every 6 (six) hours as needed for wheezing or shortness of breath.   azithromycin 500 MG tablet Commonly known as: Zithromax Take 1 tablet (500 mg total) by mouth daily for 3 days.   benzonatate 100 MG capsule Commonly known as: TESSALON Take 1 capsule (100 mg total) by mouth 3 (three) times daily as needed for cough. Do not take with alcohol or while driving or operating heavy machinery.  May cause drowsiness.   cefdinir 300 MG capsule Commonly known as: OMNICEF Take 1 capsule (300 mg total) by mouth 2 (two) times daily for 4 days.   cetirizine 10 MG tablet Commonly known as: ZYRTEC Take  1 tablet (10 mg total) by mouth daily.   dextromethorphan-guaiFENesin 30-600 MG 12hr tablet Commonly known as: MUCINEX DM Take 1 tablet by mouth 2 (two) times daily for 7 days.   diphenhydrAMINE 25 MG tablet Commonly known as: BENADRYL Take 25 mg by mouth every morning.   ipratropium-albuterol 0.5-2.5 (3) MG/3ML Soln Commonly known as: DUONEB Take 3 mLs by nebulization every 6 (six)  hours.   mometasone-formoterol 200-5 MCG/ACT Aero Commonly known as: DULERA Inhale 2 puffs into the lungs 2 (two) times daily.   Dulera 200-5 MCG/ACT Aero Generic drug: mometasone-formoterol Take 2 puffs first thing in am and then another 2 puffs about 12 hours later.   montelukast 10 MG tablet Commonly known as: SINGULAIR Take 1 tablet (10 mg total) by mouth at bedtime.   predniSONE 10 MG tablet Commonly known as: DELTASONE Take 4 tablets (40 mg total) by mouth daily for 5 days. What changed:  medication strength when to take this   Wegovy 0.25 MG/0.5ML Soaj Generic drug: Semaglutide-Weight Management Inject 0.25 mg into the skin every Wednesday.               Durable Medical Equipment  (From admission, onward)           Start     Ordered   12/16/23 0000  For home use only DME Nebulizer machine       Question Answer Comment  Patient needs a nebulizer to treat with the following condition Asthma   Length of Need Lifetime   Additional equipment included Administration kit   Additional equipment included Filter      12/16/23 1118            Follow-up Information     Del Nigel Berthold, FNP. Schedule an appointment as soon as possible for a visit in 1 week(s).   Specialty: Family Medicine Contact information: [address] [place] [address] [phone]                Allergies  Allergen Reactions   Benzyl Alcohol     Burns skin   Cough Syrup [Guaifenesin] Nausea And Vomiting   Haemophilus Influenzae Vaccines     Hospitalized for 14 days asthma attack.  Allergic to ALL VACCINES   Other Nausea And Vomiting    Any coated medications   Petrolatum    Iodine Rash    Consultations: None   Procedures/Studies: DG Chest 2 View Result Date: 12/14/2023 CLINICAL DATA:  Evaluate for pneumonia.  Persistent fevers. EXAM: CHEST - 2 VIEW COMPARISON:  12/10/2023 FINDINGS: Heart size and mediastinal contours appear normal. No  pleural fluid or interstitial edema. Interval improvement in right middle lobe opacities noted on the previous exam. No new findings. IMPRESSION: Interval improvement in right middle lobe pneumonia. Electronically Signed   By: Signa Kell M.D.   On: 12/14/2023 08:02   DG Chest 2 View Result Date: 12/10/2023 CLINICAL DATA:  Cough for the past 2 weeks. EXAM: CHEST - 2 VIEW COMPARISON:  Chest x-ray dated May 12, 2023. FINDINGS: The heart size and mediastinal contours are within normal limits. Normal pulmonary vascularity. New patchy densities in the right middle lobe. No pleural effusion or pneumothorax. No acute osseous abnormality. IMPRESSION: 1. Right middle lobe pneumonia. Electronically Signed   By: Obie Dredge M.D.   On: 12/10/2023 15:45     Discharge Exam: Vitals:   12/16/23 0652 12/16/23 0950  BP:  127/76  Pulse:  84  Resp:  Temp:  97.7 F (36.5 C)  SpO2: 97% 94%   Vitals:   12/16/23 0332 12/16/23 0542 12/16/23 0652 12/16/23 0950  BP:  136/62  127/76  Pulse:  68  84  Resp:  20    Temp:  98.2 F (36.8 C)  97.7 F (36.5 C)  TempSrc:  Oral  Oral  SpO2: 94% 92% 97% 94%  Weight:      Height:        General: Pt is alert, awake, not in acute distress Cardiovascular: RRR, S1/S2 +, no rubs, no gallops Respiratory: CTA bilaterally, no wheezing, no rhonchi Abdominal: Soft, NT, ND, bowel sounds + Extremities: no edema, no cyanosis    The results of significant diagnostics from this hospitalization (including imaging, microbiology, ancillary and laboratory) are listed below for reference.     Microbiology: Recent Results (from the past 240 hours)  SARS Coronavirus 2 by RT PCR (hospital order, performed in [facility] hospital lab) *cepheid single result test* Anterior Nasal Swab     Status: None   Collection Time: 12/14/23  8:21 AM   Specimen: Anterior Nasal Swab  Result Value Ref Range Status   SARS Coronavirus 2 by RT PCR NEGATIVE NEGATIVE Final    Comment:  (NOTE) SARS-CoV-2 target nucleic acids are NOT DETECTED.  The SARS-CoV-2 RNA is generally detectable in upper and lower respiratory specimens during the acute phase of infection. The lowest concentration of SARS-CoV-2 viral copies this assay can detect is 250 copies / mL. A negative result does not preclude SARS-CoV-2 infection and should not be used as the sole basis for treatment or other patient management decisions.  A negative result may occur with improper specimen collection / handling, submission of specimen other than nasopharyngeal swab, presence of viral mutation(s) within the areas targeted by this assay, and inadequate number of viral copies (<250 copies / mL). A negative result must be combined with clinical observations, patient history, and epidemiological information.  Fact Sheet for Patients:   RoadLapTop.co.za  Fact Sheet for Healthcare Providers: http://kim-miller.com/  This test is not yet approved or  cleared by the [place] FDA and has been authorized for detection and/or diagnosis of SARS-CoV-2 by FDA under an Emergency Use Authorization (EUA).  This EUA will remain in effect (meaning this test can be used) for the duration of the COVID-19 declaration under Section 564(b)(1) of the Act, 21 U.S.C. section 360bbb-3(b)(1), unless the authorization is terminated or revoked sooner.  Performed at [facility], [address], [place], [place] [address]   Expectorated Sputum Assessment w Gram Stain, Rflx to Resp Cult     Status: None   Collection Time: 12/14/23 10:16 AM   Specimen: Sputum  Result Value Ref Range Status   Specimen Description SPUTUM  Final   Special Requests NONE  Final   Sputum evaluation   Final    THIS SPECIMEN IS ACCEPTABLE FOR SPUTUM CULTURE Performed at [facility], [address], [place], [place] [address]    Report Status 12/16/2023 FINAL  Final  Culture, blood (routine x 2) Call MD if  unable to obtain prior to antibiotics being given     Status: None (Preliminary result)   Collection Time: 12/14/23 10:49 AM   Specimen: BLOOD  Result Value Ref Range Status   Specimen Description BLOOD BLOOD RIGHT ARM  Final   Special Requests   Final    Blood Culture adequate volume BOTTLES DRAWN AEROBIC AND ANAEROBIC   Culture   Final  NO GROWTH 2 DAYS Performed at [facility], [address] [place], [place] [address]    Report Status PENDING  Incomplete  Culture, blood (routine x 2) Call MD if unable to obtain prior to antibiotics being given     Status: None (Preliminary result)   Collection Time: 12/14/23 10:49 AM   Specimen: BLOOD  Result Value Ref Range Status   Specimen Description BLOOD BLOOD RIGHT HAND  Final   Special Requests   Final    Blood Culture adequate volume BOTTLES DRAWN AEROBIC AND ANAEROBIC   Culture   Final    NO GROWTH 2 DAYS Performed at [facility], [address] [place], [place] [address]    Report Status PENDING  Incomplete     Labs: BNP (last 3 results) No results for input(s): "BNP" in the last 8760 hours. Basic Metabolic Panel: Recent Labs  Lab 12/14/23 0803 12/14/23 1049 12/15/23 0432  NA 135  --  138  K 3.7  --  4.2  CL 105  --  107  CO2 20*  --  19*  GLUCOSE 111*  --  153*  BUN 20  --  12  CREATININE 0.99  --  0.64  CALCIUM 8.4*  --  9.0  MG  --  1.9  --   PHOS  --  2.4*  --    Liver Function Tests: No results for input(s): "AST", "ALT", "ALKPHOS", "BILITOT", "PROT", "ALBUMIN" in the last 168 hours. No results for input(s): "LIPASE", "AMYLASE" in the last 168 hours. No results for input(s): "AMMONIA" in the last 168 hours. CBC: Recent Labs  Lab 12/14/23 0803 12/15/23 0432  WBC 7.7 7.6  NEUTROABS 5.7  --   HGB 13.2 14.0  HCT 39.6 42.7  MCV 84.1 84.6  PLT 174 206   Cardiac Enzymes: No results for input(s): "CKTOTAL", "CKMB", "CKMBINDEX", "TROPONINI" in the last 168 hours. BNP: Invalid input(s): "POCBNP" CBG: No  results for input(s): "GLUCAP" in the last 168 hours. D-Dimer Recent Labs    12/14/23 0855  DDIMER 0.34   Hgb A1c No results for input(s): "HGBA1C" in the last 72 hours. Lipid Profile No results for input(s): "CHOL", "HDL", "LDLCALC", "TRIG", "CHOLHDL", "LDLDIRECT" in the last 72 hours. Thyroid function studies Recent Labs    12/14/23 1049  TSH 1.273   Anemia work up No results for input(s): "VITAMINB12", "FOLATE", "FERRITIN", "TIBC", "IRON", "RETICCTPCT" in the last 72 hours. Urinalysis    Component Value Date/Time   COLORURINE YELLOW 12/07/2020 1906   APPEARANCEUR CLOUDY (A) 12/07/2020 1906   LABSPEC 1.018 12/07/2020 1906   PHURINE 6.0 12/07/2020 1906   GLUCOSEU NEGATIVE 12/07/2020 1906   HGBUR NEGATIVE 12/07/2020 1906   BILIRUBINUR negative 05/21/2023 1218   KETONESUR negative 05/21/2023 1218   KETONESUR NEGATIVE 12/07/2020 1906   PROTEINUR negative 05/21/2023 1218   PROTEINUR NEGATIVE 12/07/2020 1906   UROBILINOGEN 0.2 05/21/2023 1218   NITRITE Negative 05/21/2023 1218   NITRITE NEGATIVE 12/07/2020 1906   LEUKOCYTESUR Trace (A) 05/21/2023 1218   LEUKOCYTESUR MODERATE (A) 12/07/2020 1906   Sepsis Labs Recent Labs  Lab 12/14/23 0803 12/15/23 0432  WBC 7.7 7.6   Microbiology Recent Results (from the past 240 hours)  SARS Coronavirus 2 by RT PCR (hospital order, performed in [facility] hospital lab) *cepheid single result test* Anterior Nasal Swab     Status: None   Collection Time: 12/14/23  8:21 AM   Specimen: Anterior Nasal Swab  Result Value Ref Range Status   SARS Coronavirus  2 by RT PCR NEGATIVE NEGATIVE Final    Comment: (NOTE) SARS-CoV-2 target nucleic acids are NOT DETECTED.  The SARS-CoV-2 RNA is generally detectable in upper and lower respiratory specimens during the acute phase of infection. The lowest concentration of SARS-CoV-2 viral copies this assay can detect is 250 copies / mL. A negative result does not preclude SARS-CoV-2 infection and  should not be used as the sole basis for treatment or other patient management decisions.  A negative result may occur with improper specimen collection / handling, submission of specimen other than nasopharyngeal swab, presence of viral mutation(s) within the areas targeted by this assay, and inadequate number of viral copies (<250 copies / mL). A negative result must be combined with clinical observations, patient history, and epidemiological information.  Fact Sheet for Patients:   RoadLapTop.co.za  Fact Sheet for Healthcare Providers: http://kim-miller.com/  This test is not yet approved or  cleared by the [place] FDA and has been authorized for detection and/or diagnosis of SARS-CoV-2 by FDA under an Emergency Use Authorization (EUA).  This EUA will remain in effect (meaning this test can be used) for the duration of the COVID-19 declaration under Section 564(b)(1) of the Act, 21 U.S.C. section 360bbb-3(b)(1), unless the authorization is terminated or revoked sooner.  Performed at [facility], [address], [place], [place] [address]   Expectorated Sputum Assessment w Gram Stain, Rflx to Resp Cult     Status: None   Collection Time: 12/14/23 10:16 AM   Specimen: Sputum  Result Value Ref Range Status   Specimen Description SPUTUM  Final   Special Requests NONE  Final   Sputum evaluation   Final    THIS SPECIMEN IS ACCEPTABLE FOR SPUTUM CULTURE Performed at [facility], [address] [place], [place] [address]    Report Status 12/16/2023 FINAL  Final  Culture, blood (routine x 2) Call MD if unable to obtain prior to antibiotics being given     Status: None (Preliminary result)   Collection Time: 12/14/23 10:49 AM   Specimen: BLOOD  Result Value Ref Range Status   Specimen Description BLOOD BLOOD RIGHT ARM  Final   Special Requests   Final    Blood Culture adequate volume BOTTLES DRAWN AEROBIC AND ANAEROBIC   Culture    Final    NO GROWTH 2 DAYS Performed at [facility], [address], [place], [place] [address]    Report Status PENDING  Incomplete  Culture, blood (routine x 2) Call MD if unable to obtain prior to antibiotics being given     Status: None (Preliminary result)   Collection Time: 12/14/23 10:49 AM   Specimen: BLOOD  Result Value Ref Range Status   Specimen Description BLOOD BLOOD RIGHT HAND  Final   Special Requests   Final    Blood Culture adequate volume BOTTLES DRAWN AEROBIC AND ANAEROBIC   Culture   Final    NO GROWTH 2 DAYS Performed at [facility], [address], [place], [place] [address]    Report Status PENDING  Incomplete     Time coordinating discharge: 35 minutes  SIGNED:   Erick Blinks, DO [facility] 12/16/2023, 11:18 AM  If 7PM-7AM, please contact night-coverage [email or web address]

## 2023-12-16 NOTE — Plan of Care (Signed)
  Problem: Clinical Measurements: Goal: Respiratory complications will improve Outcome: Progressing   

## 2023-12-16 NOTE — Progress Notes (Addendum)
Pt required duo-neb overnight for coughing and bilateral wheezing. SPO2 remained in low 90's. Pt declined lovenox. No other acute events overnight.[place] RN

## 2023-12-16 NOTE — Progress Notes (Signed)
Patient discharged home with instructions given on medications, and follow up appointments verbalized understanding. Prescriptions sent to Pharmacy of choice documented on AVS. IV discontinued, catheter intact. Accompanied by staff to an awaiting vehicle.

## 2023-12-16 NOTE — TOC Transition Note (Signed)
Transition of Care [facility]) - Discharge Note   Patient Details  Name: Amy [name] MRN: [id number] Date of Birth: [birth year]  Transition of [facility]) CM/SW Contact:  Leitha Bleak, RN Phone Number: 12/16/2023, 2:33 PM   Clinical Narrative:   MD ordering a neb machine, due to holiday's it will be couple of days for delivery. [employer] is on site and has neb machine. They will process the order and deliver neb to the room. RN updated.    Final next level of care: Home/Self Care Barriers to Discharge: Barriers Resolved   Patient Goals and CMS Choice      Patient and family notified of of transfer: 12/16/23  Discharge Plan and Services Additional resources added to the After Visit Summary for                 DME Arranged: Nebulizer machine DME Agency: [employer] Date DME Agency Contacted: 12/16/23 Time DME Agency Contacted: 1408 Representative spoke with at DME Agency: Ian Malkin            Social Drivers of Health (SDOH) Interventions SDOH Screenings   Food Insecurity: No Food Insecurity (12/14/2023)  Housing: Low Risk  (12/14/2023)  Transportation Needs: No Transportation Needs (12/14/2023)  Utilities: Not At Risk (12/14/2023)  Alcohol Screen: Low Risk  (10/11/2020)  Depression (PHQ2-9): Low Risk  (10/23/2023)  Financial Resource Strain: Medium Risk (10/11/2020)  Physical Activity: Insufficiently Active (10/11/2020)  Social Connections: Moderately Isolated (10/11/2020)  Stress: No Stress Concern Present (10/11/2020)  Tobacco Use: Low Risk  (12/14/2023)    Readmission Risk Interventions     No data to display

## 2023-12-17 ENCOUNTER — Telehealth: Payer: Self-pay

## 2023-12-17 ENCOUNTER — Telehealth: Payer: Self-pay | Admitting: *Deleted

## 2023-12-17 LAB — LEGIONELLA PNEUMOPHILA SEROGP 1 UR AG: L. pneumophila Serogp 1 Ur Ag: NEGATIVE

## 2023-12-17 NOTE — Transitions of Care (Post Inpatient/ED Visit) (Signed)
   12/17/2023  Name: Amy [name] MRN: [id number] DOB: Feb 12, 1996  Today's TOC FU Call Status: Today's TOC FU Call Status:: Unsuccessful Call (1st Attempt) Unsuccessful Call (1st Attempt) Date: 12/17/23  Attempted to reach the patient regarding the most recent Inpatient/ED visit.  Follow Up Plan: Additional outreach attempts will be made to reach the patient to complete the Transitions of Care (Post Inpatient/ED visit) call.   Irving Shows [facility], BSN RN Care Manager/ Transition of Care [place] [phone]

## 2023-12-17 NOTE — Transitions of Care (Post Inpatient/ED Visit) (Unsigned)
12/17/2023  Name: Amy [name] MRN: [id number] DOB: Sep 15, 1996  Today's TOC FU Call Status: Today's TOC FU Call Status:: Successful TOC FU Call Completed TOC FU Call Complete Date: 12/17/23 Patient's Name and Date of Birth confirmed.  Transition Care Management Follow-up Telephone Call Date of Discharge: 12/16/23 [facility] Type of Discharge: Emergency Department Reason for ED Visit: Other:, Respiratory Respiratory Diagnosis: Asthma (Uncontrolled) How have you been since you were released from the hospital?: Better Any questions or concerns?: No  Items Reviewed: Did you receive and understand the discharge instructions provided?: Yes Medications obtained,verified, and reconciled?: Yes (Medications Reviewed) Any new allergies since your discharge?: No Dietary orders reviewed?: NA Do you have support at home?: Yes People in Home: spouse, sibling(s)  Medications Reviewed Today: Medications Reviewed Today     Reviewed by Trajan Grove, Jordan Hawks, CMA (Certified Medical Assistant) on 12/17/23 at [phone]  Med List Status: <None>   Medication Order Taking? Sig Documenting Provider Last Dose Status Informant  albuterol (VENTOLIN HFA) 108 (90 Base) MCG/ACT inhaler [id number] No Inhale 1-2 puffs into the lungs every 6 (six) hours as needed for wheezing or shortness of breath. Del Newman Nip, Tenna Child, FNP Past Week Active Self  azithromycin (ZITHROMAX) 500 MG tablet [id number]  Take 1 tablet (500 mg total) by mouth daily for 3 days. Sherryll Burger, Pratik D, DO  Active   benzonatate (TESSALON) 100 MG capsule [id number] No Take 1 capsule (100 mg total) by mouth 3 (three) times daily as needed for cough. Do not take with alcohol or while driving or operating heavy machinery.  May cause drowsiness. Valentino Nose, NP 12/13/2023 Active Self  cefdinir (OMNICEF) 300 MG capsule [id number]  Take 1 capsule (300 mg total) by mouth 2 (two) times daily for 4 days. Sherryll Burger, Pratik D,  DO  Active   cetirizine (ZYRTEC) 10 MG tablet [id number] No Take 1 tablet (10 mg total) by mouth daily. Bing Neighbors, NP 12/13/2023 Active Self  dextromethorphan-guaiFENesin (MUCINEX DM) 30-600 MG 12hr tablet [id number]  Take 1 tablet by mouth 2 (two) times daily for 7 days. Sherryll Burger, Pratik D, DO  Active   diphenhydrAMINE (BENADRYL) 25 MG tablet [id number] No Take 25 mg by mouth every morning. [provider] 12/13/2023 Active Self  ipratropium-albuterol (DUONEB) 0.5-2.5 (3) MG/3ML SOLN [id number] No Take 3 mLs by nebulization every 6 (six) hours. Del Newman Nip, Tenna Child, FNP Past Week Active Self  mometasone-formoterol (DULERA) 200-5 MCG/ACT AERO [id number] No Inhale 2 puffs into the lungs 2 (two) times daily. Del Nigel Berthold, FNP 12/14/2023 Active Self  mometasone-formoterol (DULERA) 200-5 MCG/ACT AERO [id number] No Take 2 puffs first thing in am and then another 2 puffs about 12 hours later.  Patient not taking: Reported on 12/14/2023   Nyoka Cowden, MD Not Taking Active Self  montelukast (SINGULAIR) 10 MG tablet [id number] No Take 1 tablet (10 mg total) by mouth at bedtime. [address] [place], [place], [place] 12/13/2023 Active Self  predniSONE (DELTASONE) 10 MG tablet [id number]  Take 4 tablets (40 mg total) by mouth daily for 5 days. Maurilio Lovely D, DO  Active   WEGOVY 0.25 MG/0.5ML Ivory Broad [id number] No Inject 0.25 mg into the skin every Wednesday. [provider] 12/11/2023 Active Self            Home Care and Equipment/Supplies: Were Home Health Services Ordered?: NA Any new equipment or medical supplies ordered?: NA  Functional Questionnaire: Do you need assistance with bathing/showering or dressing?: No Do  you need assistance with meal preparation?: No Do you need assistance with eating?: No Do you have difficulty maintaining continence: No Do you need assistance with getting out of bed/getting out of a chair/moving?: No Do you have difficulty managing or taking  your medications?: No  Follow up appointments reviewed: PCP Follow-up appointment confirmed?: NA Specialist Hospital Follow-up appointment confirmed?: NA Do you need transportation to your follow-up appointment?: No Do you understand care options if your condition(s) worsen?: Yes-patient verbalized understanding  Patient's previously scheduled follow up on December 23, 2023 changed to a Hospital F/U. Needs follow up within 7 calendar days of discharge  Abby Pricilla Moehle, CMA  [facility] AWV Team Direct Dial: [phone]

## 2023-12-18 LAB — CULTURE, RESPIRATORY W GRAM STAIN: Culture: NORMAL

## 2023-12-19 LAB — CULTURE, BLOOD (ROUTINE X 2)
Special Requests: ADEQUATE
Special Requests: ADEQUATE

## 2023-12-20 ENCOUNTER — Telehealth: Payer: Self-pay | Admitting: *Deleted

## 2023-12-20 NOTE — Progress Notes (Unsigned)
   Established Patient Office Visit   Subjective  Patient ID: Amy [name], female    DOB: 10/06/1996  Age: 27 y.o. MRN: [id number]  No chief complaint on file.   She  has a past medical history of Asthma, Family history of adverse reaction to anesthesia, Migraines, Narcolepsy, PONV (postoperative nausea and vomiting), Pregnancy (12/27/2019), PTSD (post-traumatic stress disorder), and Seasonal allergies.  HPI  ROS    Objective:     LMP 11/26/2023 (Approximate)  {Vitals History (Optional):23777}  Physical Exam   No results found for any visits on 12/23/23.  The ASCVD Risk score (Arnett DK, et al., 2019) failed to calculate for the following reasons:   The 2019 ASCVD risk score is only valid for ages 85 to 42    Assessment & Plan:  There are no diagnoses linked to this encounter.  No follow-ups on file.   Cruzita Lederer Newman Nip, FNP

## 2023-12-20 NOTE — Transitions of Care (Post Inpatient/ED Visit) (Signed)
12/20/2023  Name: Amy [name] MRN: [id number] DOB: Feb 10, 1996  Today's TOC FU Call Status: Today's TOC FU Call Status:: Successful TOC FU Call Completed TOC FU Call Complete Date: 12/20/23 Patient's Name and Date of Birth confirmed.  Transition Care Management Follow-up Telephone Call Date of Discharge: 12/16/23 [facility] Type of Discharge: Inpatient Admission Primary Inpatient Discharge Diagnosis:: Sepsis/ asthma Respiratory Diagnosis: Asthma (Uncontrolled) How have you been since you were released from the hospital?: Better ("I am fine, feeling back to normal.  Only have one more day of the last 2 medications and I will be done.  Seeing PCP on Monday; breathing much better.  I got the nebulizer while I was at the hospital") Any questions or concerns?: No  Items Reviewed: Did you receive and understand the discharge instructions provided?: Yes (thoroughly reviewed with patient who verbalizes good understanding of same) Medications obtained,verified, and reconciled?: Yes (Medications Reviewed) (Full medication reconciliation/ review completed; no concerns or discrepancies identified; confirmed patient obtained/ is taking all newly Rx'd medications as instructed; self-manages medications and denies questions/ concerns around medications today) Any new allergies since your discharge?: No Dietary orders reviewed?: Yes Type of Diet Ordered:: "Pretty much regular; I try to eat healthy" Do you have support at home?: Yes People in Home: spouse, child(ren), dependent, sibling(s) Name of Support/Comfort Primary Source: Reports independent in self-care activities; supportive family assists as/ if needed/ indicated  Medications Reviewed Today: Medications Reviewed Today     Reviewed by Michaela Corner, RN (Registered Nurse) on 12/20/23 at 1459  Med List Status: <None>   Medication Order Taking? Sig Documenting Provider Last Dose Status Informant   albuterol (VENTOLIN HFA) 108 (90 Base) MCG/ACT inhaler [id number] Yes Inhale 1-2 puffs into the lungs every 6 (six) hours as needed for wheezing or shortness of breath. Del Newman Nip, Tenna Child, FNP Taking Active Self  benzonatate (TESSALON) 100 MG capsule [id number] Yes Take 1 capsule (100 mg total) by mouth 3 (three) times daily as needed for cough. Do not take with alcohol or while driving or operating heavy machinery.  May cause drowsiness. Valentino Nose, NP Taking Active Self  cefdinir (OMNICEF) 300 MG capsule [id number] Yes Take 1 capsule (300 mg total) by mouth 2 (two) times daily for 4 days. Maurilio Lovely D, DO Taking Active            Med Note Michaela Corner   Fri Dec 20, 2023  2:58 PM) 12/10/23: reports during [facility] call, has almost completed course- reports "should be finished up tomorrow"  cetirizine (ZYRTEC) 10 MG tablet [id number] Yes Take 1 tablet (10 mg total) by mouth daily. Bing Neighbors, NP Taking Active Self  dextromethorphan-guaiFENesin [facility] DM) 30-600 MG 12hr tablet [id number] Yes Take 1 tablet by mouth 2 (two) times daily for 7 days. Sherryll Burger, Pratik D, DO Taking Active   diphenhydrAMINE (BENADRYL) 25 MG tablet [id number] Yes Take 25 mg by mouth every morning. [provider] Taking Active Self  ipratropium-albuterol (DUONEB) 0.5-2.5 (3) MG/3ML SOLN [id number] Yes Take 3 mLs by nebulization every 6 (six) hours. Del Nigel Berthold, FNP Taking Active Self  mometasone-formoterol [facility]) 200-5 MCG/ACT Sandrea Matte [id number] Yes Inhale 2 puffs into the lungs 2 (two) times daily. Del Newman Nip, Tenna Child, FNP Taking Active Self  mometasone-formoterol [facility]) 200-5 MCG/ACT Sandrea Matte [id number] No Take 2 puffs first thing in am and then another 2 puffs about 12 hours later.  Patient not taking: Reported on 12/14/2023   Sandrea Hughs  B, MD Not Taking Active Self  montelukast (SINGULAIR) 10 MG tablet [id number] Yes Take 1 tablet (10 mg total) by mouth at bedtime. Del Newman Nip,  Tenna Child, FNP Taking Active Self  predniSONE (DELTASONE) 10 MG tablet [id number] Yes Take 4 tablets (40 mg total) by mouth daily for 5 days. Maurilio Lovely D, DO Taking Active            Med Note Michaela Corner   Fri Dec 20, 2023  2:59 PM) 12/10/23: reports during [facility] call, has almost completed course- reports "should be finished up by Sunday"   WEGOVY 0.25 MG/0.5ML SOAJ [id number] Yes Inject 0.25 mg into the skin every Wednesday. [provider] Taking Active Self           Home Care and Equipment/Supplies: Were Home Health Services Ordered?: No Any new equipment or medical supplies ordered?: Yes (nebulizer) Name of Medical supply agency?: [employer] Were you able to get the equipment/medical supplies?: Yes Do you have any questions related to the use of the equipment/supplies?: No  Functional Questionnaire: Do you need assistance with bathing/showering or dressing?: No Do you need assistance with meal preparation?: No Do you need assistance with eating?: No Do you have difficulty maintaining continence: No Do you need assistance with getting out of bed/getting out of a chair/moving?: No Do you have difficulty managing or taking your medications?: No  Follow up appointments reviewed: PCP Follow-up appointment confirmed?: Yes Date of PCP follow-up appointment?: 12/23/23 Follow-up Provider: PCP Specialist Hospital Follow-up appointment confirmed?: NA (verified not indicated per hospital discharging provider discharge notes) Do you need transportation to your follow-up appointment?: No Do you understand care options if your condition(s) worsen?: Yes-patient verbalized understanding  SDOH Interventions Today    Flowsheet Row Most Recent Value  SDOH Interventions   Food Insecurity Interventions Intervention Not Indicated  Housing Interventions Intervention Not Indicated  Transportation Interventions Intervention Not Indicated  [reports drives self]  Utilities Interventions  Intervention Not Indicated      Interventions Today    Flowsheet Row Most Recent Value  Chronic Disease   Chronic disease during today's visit Other  [Chronic Asthma]  General Interventions   General Interventions Discussed/Reviewed General Interventions Discussed, Durable Medical Equipment (DME)  Durable Medical Equipment (DME) Other  [nebulizer- confirmed does not use assistive devices]  Nutrition Interventions   Nutrition Discussed/Reviewed Nutrition Discussed  Pharmacy Interventions   Pharmacy Dicussed/Reviewed Pharmacy Topics Discussed  [Full medication review with updating medication list in EHR per patient report]       TOC Interventions Today    Flowsheet Row Most Recent Value  TOC Interventions   TOC Interventions Discussed/Reviewed TOC Interventions Discussed      Caryl Pina, RN, BSN, CCRN Alumnus RN Care Manager  Transitions of Care  [facility] Population [facility]  [place] [phone]: direct office

## 2023-12-23 ENCOUNTER — Encounter: Payer: Self-pay | Admitting: Family Medicine

## 2023-12-23 ENCOUNTER — Ambulatory Visit: Payer: Medicaid Other | Admitting: Family Medicine

## 2023-12-23 VITALS — BP 114/76 | HR 90 | Ht 64.0 in | Wt 224.1 lb

## 2023-12-23 DIAGNOSIS — Z09 Encounter for follow-up examination after completed treatment for conditions other than malignant neoplasm: Secondary | ICD-10-CM | POA: Insufficient documentation

## 2023-12-23 MED ORDER — BENZONATATE 200 MG PO CAPS: 200.0000 mg | ORAL_CAPSULE | Freq: Two times a day (BID) | ORAL | 1 refills | Status: DC | PRN

## 2023-12-23 NOTE — Assessment & Plan Note (Addendum)
The hospital chart, including the discharge summary, was thoroughly reviewed Medications were thoroughly reviewed and reconciled with the patient.  Advise to follow up with Pulmonology   Patient reports current regimen includes Dulera 200 Take 2 puffs daily, Albuterol PRN and Singulair daily

## 2023-12-23 NOTE — Addendum Note (Signed)
Addended by: Rica Records on: 12/23/2023 01:53 PM   Modules accepted: Orders

## 2023-12-23 NOTE — Patient Instructions (Addendum)
        Great to see you today.  I have refilled the medication(s) we provide.    [facility]. [address] [place], [place] [address] [phone]   - Please take medications as prescribed. - Follow up with your primary health provider if any health concerns arises. - If symptoms worsen please contact your primary care provider and/or visit the emergency department.

## 2024-03-27 ENCOUNTER — Ambulatory Visit: Admitting: Family Medicine

## 2024-03-27 ENCOUNTER — Ambulatory Visit: Payer: Self-pay

## 2024-03-27 ENCOUNTER — Encounter: Payer: Self-pay | Admitting: Family Medicine

## 2024-03-27 VITALS — BP 126/84 | HR 86 | Ht 64.0 in | Wt 227.0 lb

## 2024-03-27 DIAGNOSIS — J4520 Mild intermittent asthma, uncomplicated: Secondary | ICD-10-CM | POA: Diagnosis not present

## 2024-03-27 DIAGNOSIS — Z7689 Persons encountering health services in other specified circumstances: Secondary | ICD-10-CM

## 2024-03-27 DIAGNOSIS — J45909 Unspecified asthma, uncomplicated: Secondary | ICD-10-CM | POA: Diagnosis not present

## 2024-03-27 DIAGNOSIS — J069 Acute upper respiratory infection, unspecified: Secondary | ICD-10-CM | POA: Diagnosis not present

## 2024-03-27 MED ORDER — IPRATROPIUM-ALBUTEROL 0.5-2.5 (3) MG/3ML IN SOLN: 3.0000 mL | Freq: Four times a day (QID) | RESPIRATORY_TRACT | 5 refills | Status: AC

## 2024-03-27 MED ORDER — PREDNISONE 20 MG PO TABS: 20.0000 mg | ORAL_TABLET | Freq: Two times a day (BID) | ORAL | 0 refills | Status: AC

## 2024-03-27 MED ORDER — MONTELUKAST SODIUM 10 MG PO TABS: 10.0000 mg | ORAL_TABLET | Freq: Every day | ORAL | 3 refills | Status: DC

## 2024-03-27 MED ORDER — PROMETHAZINE-DM 6.25-15 MG/5ML PO SYRP
5.0000 mL | ORAL_SOLUTION | Freq: Four times a day (QID) | ORAL | 0 refills | Status: DC | PRN
Start: 2024-03-27 — End: 2024-05-15

## 2024-03-27 MED ORDER — CEFDINIR 300 MG PO CAPS: 300.0000 mg | ORAL_CAPSULE | Freq: Two times a day (BID) | ORAL | 0 refills | Status: AC

## 2024-03-27 NOTE — Patient Instructions (Addendum)
   Great to see you today.  I have refilled the medication(s) we provide.   Here are some options to get the neuropsychiatric testing to have a definitive answer on if you have ADD or not: Eula Flax, NP at [facility] [phone]) or [facility] [phone]) or Ronney Asters at [facility]. [facility] [phone] has multiple providers. [facility] [phone] has several providers specializing in ADHD/Bipolar= Andrena Mews, PhD is expert at Adult ADHD, Verna Czech, PhD treats adults with both diagnoses.          If labs were collected, we will inform you of lab results once received either by echart message or telephone call.   - echart message- for normal results that have been seen by the patient already.   - telephone call: abnormal results or if patient has not viewed results in their echart.   - Please take medications as prescribed. - Follow up with your primary health provider if any health concerns arises. - If symptoms worsen please contact your primary care provider and/or visit the emergency department.

## 2024-03-27 NOTE — Progress Notes (Signed)
 Established Patient Office Visit   Subjective  Patient ID: Amy [name], female    DOB: [birth year]  Age: 28 y.o. MRN: [id number]  Chief Complaint  Patient presents with   Acute Visit    Asthma: -productive cough with yellow phlegm due to pollen--some shortness of breath only on exertion & wheezing P.t reports taking 2-3 asthma treatments every hour to relieve symptoms. And allergy medicine is not helping. She is having severe drainage ,she has elevated her bed but it is not assisting in relief of symptoms.  Reports pain in chest and throat due to coughing and mucus secretions      She  has a past medical history of Asthma, Family history of adverse reaction to anesthesia, Migraines, Narcolepsy, PONV (postoperative nausea and vomiting), Pregnancy (12/27/2019), PTSD (post-traumatic stress disorder), and Seasonal allergies.  Patient complains of persistent cough. Patient describes symptoms of shortness of breath at rest, cough, fatigue, malaise, sore throat, sputum production, and wheezing. Symptoms began a few weeks ago and are gradually worsening since that time. Patient denies chest pain or nausea and vomiting. Treatment thus far includes, Dulera inhaler, and albuterol, OTC allergy, OTC analgesics/antipyretics: not very effective, anti-tussive: not very effective. Past pulmonary history is significant for asthma and occasional episodes of bronchitis       Review of Systems  Constitutional:  Positive for malaise/fatigue. Negative for chills and fever.  Respiratory:  Positive for cough, sputum production, shortness of breath and wheezing. Negative for hemoptysis.   Cardiovascular:  Negative for chest pain.  Neurological:  Negative for dizziness and headaches.      Objective:     BP 126/84   Pulse 86   Ht 5\' 4"  (1.626 m)   Wt 227 lb (103 kg)   LMP 03/07/2024   SpO2 94%   BMI 38.96 kg/m  BP Readings from Last 3 Encounters:  03/27/24 126/84  12/23/23 114/76   12/16/23 123/80      Physical Exam Vitals reviewed.  Constitutional:      General: She is not in acute distress.    Appearance: Normal appearance. She is not ill-appearing, toxic-appearing or diaphoretic.  HENT:     Head: Normocephalic.     Right Ear: Tympanic membrane normal.     Left Ear: Tympanic membrane normal.     Nose: Congestion and rhinorrhea present.     Mouth/Throat:     Pharynx: Posterior oropharyngeal erythema present.  Eyes:     General:        Right eye: No discharge.        Left eye: No discharge.     Conjunctiva/sclera: Conjunctivae normal.  Cardiovascular:     Rate and Rhythm: Normal rate.     Pulses: Normal pulses.     Heart sounds: Normal heart sounds.  Pulmonary:     Effort: Pulmonary effort is normal. No respiratory distress.     Breath sounds: Wheezing and rhonchi present. No rales.  Chest:     Chest wall: Tenderness present.  Skin:    General: Skin is warm and dry.     Capillary Refill: Capillary refill takes less than 2 seconds.  Neurological:     Mental Status: She is alert.     Coordination: Coordination normal.     Gait: Gait normal.  Psychiatric:        Mood and Affect: Mood normal.        Behavior: Behavior normal.      No results found for any visits  on 03/27/24.  The ASCVD Risk score (Arnett DK, et al., 2019) failed to calculate for the following reasons:   The 2019 ASCVD risk score is only valid for ages 74 to 10    Assessment & Plan:  Uncontrolled asthma -     Ambulatory referral to Allergy  Mild intermittent asthma without complication -     Montelukast Sodium; Take 1 tablet (10 mg total) by mouth at bedtime.  Dispense: 30 tablet; Refill: 3  Upper respiratory tract infection, unspecified type Assessment & Plan: Advise on Duoneb treatment every 6 hours.  Promethazine syrup PRN,Cefdinir (Omnicef) - 300 mg twice daily for 7 days Advise patient to rest to support your body's recovery. Stay hydrated by drinking water, tea,  or broth. Using a humidifier can help soothe throat irritation and ease nasal congestion. For fever or pain, acetaminophen (Tylenol) is recommended. To relieve other symptoms, try saline nasal sprays, throat lozenges, or gargling with saltwater. Focus on eating light, healthy meals like fruits and vegetables to keep your strength up. Practice good hygiene by washing your hands frequently and covering your mouth when coughing or sneezing.Follow-up for worsening or persistent symptoms. Patient verbalizes understanding regarding plan of care and all questions answered     Orders: -     Ipratropium-Albuterol; Take 3 mLs by nebulization every 6 (six) hours.  Dispense: 360 mL; Refill: 5 -     Promethazine-DM; Take 5 mLs by mouth 4 (four) times daily as needed.  Dispense: 118 mL; Refill: 0  Referral of patient -     Ambulatory referral to Obstetrics / Gynecology  Other orders -     Cefdinir; Take 1 capsule (300 mg total) by mouth 2 (two) times daily for 7 days.  Dispense: 14 capsule; Refill: 0 -     predniSONE; Take 1 tablet (20 mg total) by mouth 2 (two) times daily with a meal for 5 days.  Dispense: 10 tablet; Refill: 0    Return if symptoms worsen or fail to improve.   Cruzita Lederer Newman Nip, FNP

## 2024-03-27 NOTE — Telephone Encounter (Signed)
 Noted.

## 2024-03-27 NOTE — Assessment & Plan Note (Signed)
 Advise on Duoneb treatment every 6 hours.  Promethazine syrup PRN,Cefdinir (Omnicef) - 300 mg twice daily for 7 days Advise patient to rest to support your body's recovery. Stay hydrated by drinking water, tea, or broth. Using a humidifier can help soothe throat irritation and ease nasal congestion. For fever or pain, acetaminophen (Tylenol) is recommended. To relieve other symptoms, try saline nasal sprays, throat lozenges, or gargling with saltwater. Focus on eating light, healthy meals like fruits and vegetables to keep your strength up. Practice good hygiene by washing your hands frequently and covering your mouth when coughing or sneezing.Follow-up for worsening or persistent symptoms. Patient verbalizes understanding regarding plan of care and all questions answered

## 2024-03-27 NOTE — Telephone Encounter (Signed)
 Copied from CRM [id number]. Topic: Clinical - Red Word Triage >> Mar 27, 2024  9:36 AM Marlow Baars wrote: Red Word that prompted transfer to Nurse Triage: The patient called in because she missed her appt this morning due to severe allergies she says. She has been doing Asthma treatments all morning and has had really bad wheezing and a bad cough. She says even her allergy meds aren't helping. I scheduled her an appt but after talking with her I will transfer her to E2C2 NT   Chief Complaint: Cough Symptoms: productive cough, wheezing, shortness of breath Frequency: x 2-3 weeks Pertinent Negatives: Patient denies difficulty breathing or fever at this time during triage with this RN Disposition: [] ED /[] Urgent Care (no appt availability in office) / [x] Appointment(In office/virtual)/ []  [facility]/ [] Home Care/ [] Refused Recommended Disposition /[] [facility]/ []  Follow-up with PCP Additional Notes: Patient called and advised that she has been having issues with her asthma flaring up for the past 2-3 weeks.  She states that she has been using her nebulizers and inhalers, utilizing warm mist, and taking medication as appropriate but she still cannot seem to get ahead of this flare up.  She advised that she was hospitalized in December for her Asthma and didn't want it to get that bad.  She denies having difficulty breathing or a fever at this particular time while speaking with this RN.  She is having coughing spells that take her breath away and she states she is having episodes of wheezing--She gave herself a treatment earlier and now states she just has some slight wheezing in her left lower lung area.  She also has been having a productive cough with yellow phlegm. Appointment made for today 03/27/2024 at 10:30 am with patient's PCP.  Patient states she is going to get ready and head over there right now.  She is also advised that if anything changes or worsens, to head straight to the  Emergency Room or call 911 if needed.  Patient verbalized understanding.  Reason for Disposition  [1] MILD difficulty breathing (e.g., minimal/no SOB at rest, SOB with walking, pulse <100) AND [2] still present when not coughing  Answer Assessment - Initial Assessment Questions 1. ONSET: "When did the cough begin?"      2-3 weeks when pollen got bad 2. SEVERITY: "How bad is the cough today?"      Continuous coughing fits 3. SPUTUM: "Describe the color of your sputum" (none, dry cough; clear, white, yellow, green)     Yellow 4. HEMOPTYSIS: "Are you coughing up any blood?" If so ask: "How much?" (flecks, streaks, tablespoons, etc.)     Patient states 3 days ago she did and it wasn't much at all---she said it was the amount that she would have if she had a nosebleed" 5. DIFFICULTY BREATHING: "Are you having difficulty breathing?" If Yes, ask: "How bad is it?" (e.g., mild, moderate, severe)    - MILD: No SOB at rest, mild SOB with walking, speaks normally in sentences, can lie down, no retractions, pulse < 100.    - MODERATE: SOB at rest, SOB with minimal exertion and prefers to sit, cannot lie down flat, speaks in phrases, mild retractions, audible wheezing, pulse 100-120.    - SEVERE: Very SOB at rest, speaks in single words, struggling to breathe, sitting hunched forward, retractions, pulse > 120      Mild-moderate 6. FEVER: "Do you have a fever?" If Yes, ask: "What is your temperature, how  was it measured, and when did it start?"     No 7. CARDIAC HISTORY: "Do you have any history of heart disease?" (e.g., heart attack, congestive heart failure)      No 8. LUNG HISTORY: "Do you have any history of lung disease?"  (e.g., pulmonary embolus, asthma, emphysema)     Asthma 9. PE RISK FACTORS: "Do you have a history of blood clots?" (or: recent major surgery, recent prolonged travel, bedridden)     No 10. OTHER SYMPTOMS: "Do you have any other symptoms?" (e.g., runny nose, wheezing, chest  pain)       Patient states slight wheeze in lower left lung area 11. PREGNANCY: "Is there any chance you are pregnant?" "When was your last menstrual period?"       "I dont know. My husband and I are trying" 30. TRAVEL: "Have you traveled out of the country in the last month?" (e.g., travel history, exposures)       No  Protocols used: Cough - Acute Productive-A-AH

## 2024-04-02 ENCOUNTER — Encounter: Payer: Self-pay | Admitting: Family Medicine

## 2024-04-08 ENCOUNTER — Ambulatory Visit
Admission: RE | Admit: 2024-04-08 | Discharge: 2024-04-08 | Disposition: A | Discharge status: Home / Self Care | Source: Ambulatory Visit | Attending: Nurse Practitioner | Admitting: Nurse Practitioner

## 2024-04-08 VITALS — BP 126/86 | HR 93 | Temp 97.6°F | Resp 16

## 2024-04-08 DIAGNOSIS — N898 Other specified noninflammatory disorders of vagina: Secondary | ICD-10-CM | POA: Diagnosis present

## 2024-04-08 DIAGNOSIS — R350 Frequency of micturition: Secondary | ICD-10-CM | POA: Diagnosis not present

## 2024-04-08 LAB — POCT URINALYSIS DIP (MANUAL ENTRY)
Bilirubin, UA: NEGATIVE
Glucose, UA: NEGATIVE mg/dL
Ketones, POC UA: NEGATIVE mg/dL
Nitrite, UA: NEGATIVE
Protein Ur, POC: NEGATIVE mg/dL
Spec Grav, UA: >=1.03 — AB (ref 1.010–1.025)
Urobilinogen, UA: 0.2 U/dL
pH, UA: 5 (ref 5.0–8.0)

## 2024-04-08 MED ORDER — NYSTATIN-TRIAMCINOLONE 100000-0.1 UNIT/GM-% EX OINT: 1.0000 | TOPICAL_OINTMENT | Freq: Two times a day (BID) | CUTANEOUS | 0 refills | Status: DC

## 2024-04-08 NOTE — Discharge Instructions (Addendum)
 Your urinalysis did not indicate an obvious urinary tract infection.  Further lab work is pending.  You will be contacted if the pending lab work is abnormal.  You also access to your results via MyChart. Use medication as prescribed. Recommend avoiding sexual intercourse while symptoms persist. Recommend warm Epsom salt soaks or sitz bath while symptoms persist. Do not scrub the vaginal area while symptoms persist.  Cleanse the vaginal area with nonscented soaps or warm water. If symptoms fail to improve with this treatment, it is recommended that you follow-up with your primary care physician or gynecologist for further evaluation. Follow-up as needed.

## 2024-04-08 NOTE — ED Triage Notes (Addendum)
 Pt reports blisters in the vaginal area, causing discomfort when urinating x 2 weeks.pt is sexaully active with husband. Pt states she noticed the blisters when taking antibiotics.

## 2024-04-08 NOTE — ED Provider Notes (Signed)
 [facility]    CSN: [id number] Arrival date & time: 04/08/24  1819      History   Chief Complaint Chief Complaint  Patient presents with  . Blister    Entered by patient    HPI Amy [name] is a 28 y.o. female.   HPI  Past Medical History:  Diagnosis Date  . Asthma   . Family history of adverse reaction to anesthesia   . Migraines   . Narcolepsy   . PONV (postoperative nausea and vomiting)   . Pregnancy 12/27/2019  . PTSD (post-traumatic stress disorder)   . Seasonal allergies     Patient Active Problem List   Diagnosis Date Noted  . Hospital discharge follow-up 12/23/2023  . Sepsis (HCC) 12/14/2023  . Class 2 severe obesity with serious comorbidity in adult [facility]) 09/25/2023  . Ear infection 05/27/2023  . Wheezing 05/27/2023  . Upper respiratory infection 03/26/2023  . Encounter for cervical Pap smear with pelvic exam 02/19/2023  . Neck pain 02/02/2023  . Vaginal discharge 02/02/2023  . Acute neck pain 01/04/2023  . Stress incontinence 02/15/2021  . Ovarian cyst 06/30/2020  . PTSD (post-traumatic stress disorder) 06/30/2020  . Anxiety 06/30/2020  . History of laparoscopic cholecystectomy 06/21/2020  . Asthma 02/25/2019  . Eczema 02/25/2019    Past Surgical History:  Procedure Laterality Date  . CHOLECYSTECTOMY N/A 06/20/2020   Procedure: LAPAROSCOPIC CHOLECYSTECTOMY;  Surgeon: Alanda Allegra, MD;  Location: [facility];  Service: General;  Laterality: N/A;  . TONSILLECTOMY    . WISDOM TOOTH EXTRACTION      OB History     Gravida  1   Para      Term      Preterm      AB      Living  1      SAB      IAB      Ectopic      Multiple      Live Births               Home Medications    Prior to Admission medications   Medication Sig Start Date End Date Taking? Authorizing Provider  nystatin-triamcinolone ointment (MYCOLOG) Apply 1 Application topically 2 (two) times daily. 04/08/24  Yes Leath-Warren,  Belen Bowers, NP  albuterol (VENTOLIN HFA) 108 (90 Base) MCG/ACT inhaler Inhale 1-2 puffs into the lungs every 6 (six) hours as needed for wheezing or shortness of breath. 01/31/23   Del Abron Abt, FNP  benzonatate (TESSALON) 200 MG capsule Take 1 capsule (200 mg total) by mouth 2 (two) times daily as needed for cough. 12/23/23   Del Orbe Polanco, Iliana, FNP  cetirizine (ZYRTEC) 10 MG tablet Take 1 tablet (10 mg total) by mouth daily. 11/19/21   Buena Carmine, NP  diphenhydrAMINE (BENADRYL) 25 MG tablet Take 25 mg by mouth every morning.    [provider]  ipratropium-albuterol (DUONEB) 0.5-2.5 (3) MG/3ML SOLN Take 3 mLs by nebulization every 6 (six) hours. 03/27/24   Del Orbe Polanco, Rogerio Clay, FNP  mometasone-formoterol (DULERA) 200-5 MCG/ACT AERO Inhale 2 puffs into the lungs 2 (two) times daily. 05/27/23   Del Amber Bail, Rogerio Clay, FNP  mometasone-formoterol (DULERA) 200-5 MCG/ACT AERO Take 2 puffs first thing in am and then another 2 puffs about 12 hours later. 09/17/23   Diamond Formica, MD  montelukast (SINGULAIR) 10 MG tablet Take 1 tablet (10 mg total) by mouth at bedtime. 03/27/24   Fleta Human  Henreitta Leber, FNP  promethazine-dextromethorphan (PROMETHAZINE-DM) 6.25-15 MG/5ML syrup Take 5 mLs by mouth 4 (four) times daily as needed. 03/27/24   Del Newman Nip, Tenna Child, FNP  WEGOVY 0.25 MG/0.5ML SOAJ Inject 0.25 mg into the skin every Wednesday. Patient not taking: Reported on 03/27/2024 12/04/23   [provider]    Family History Family History  Problem Relation Age of Onset  . Diabetes Mother   . Hypertension Mother   . Irritable bowel syndrome Mother   . Diabetes Father   . Cancer Father        basal cell cancer  . Cancer Maternal Uncle        brain  . Crohn's disease Maternal Aunt   . Heart disease Maternal Grandmother   . Heart disease Maternal Grandfather   . Diabetes Maternal Grandfather     Social History Social History   Tobacco Use  . Smoking  status: Never  . Smokeless tobacco: Never  Vaping Use  . Vaping status: Never Used  Substance Use Topics  . Alcohol use: Not Currently    Comment: occ  . Drug use: No     Allergies   Benzyl alcohol, Cough syrup [guaifenesin], Haemophilus influenzae vaccines, Other, Petrolatum, and Iodine   Review of Systems Review of Systems   Physical Exam Triage Vital Signs ED Triage Vitals  Encounter Vitals Group     BP 04/08/24 1827 126/86     Systolic BP Percentile --      Diastolic BP Percentile --      Pulse Rate 04/08/24 1827 93     Resp 04/08/24 1827 16     Temp 04/08/24 1827 97.6 F (36.4 C)     Temp Source 04/08/24 1827 Oral     SpO2 04/08/24 1827 96 %     Weight --      Height --      Head Circumference --      Peak Flow --      Pain Score 04/08/24 1828 0     Pain Loc --      Pain Education --      Exclude from Growth Chart --    No data found.  Updated Vital Signs BP 126/86 (BP Location: Right Arm)   Pulse 93   Temp 97.6 F (36.4 C) (Oral)   Resp 16   LMP 03/07/2024 (Exact Date)   SpO2 96%   Visual Acuity Right Eye Distance:   Left Eye Distance:   Bilateral Distance:    Right Eye Near:   Left Eye Near:    Bilateral Near:     Physical Exam Vitals and nursing note reviewed. Exam conducted with a chaperone present Victorino Dike, Charity fundraiser).  Constitutional:      General: She is not in acute distress.    Appearance: Normal appearance.  HENT:     Head: Normocephalic.  Eyes:     Extraocular Movements: Extraocular movements intact.     Pupils: Pupils are equal, round, and reactive to light.  Pulmonary:     Effort: Pulmonary effort is normal.  Abdominal:     General: Bowel sounds are normal.     Palpations: Abdomen is soft.  Genitourinary:    Exam position: Supine.     Labia:        Left: Tenderness and lesion present.     Musculoskeletal:     Cervical back: Normal range of motion.  Neurological:     General: No focal deficit present.  Mental Status:  She is alert and oriented to person, place, and time.  Psychiatric:        Mood and Affect: Mood normal.        Behavior: Behavior normal.     UC Treatments / Results  Labs (all labs ordered are listed, but only abnormal results are displayed) Labs Reviewed  POCT URINALYSIS DIP (MANUAL ENTRY) - Abnormal; Notable for the following components:      Result Value   Spec Grav, UA >=1.030 (*)    Blood, UA trace-intact (*)    Leukocytes, UA Trace (*)    All other components within normal limits  HSV CULTURE AND TYPING  URINE CULTURE  CERVICOVAGINAL ANCILLARY ONLY    EKG   Radiology No results found.  Procedures Procedures (including critical care time)  Medications Ordered in UC Medications - No data to display  Initial Impression / Assessment and Plan / UC Course  I have reviewed the triage vital signs and the nursing notes.  Pertinent labs & imaging results that were available during my care of the patient were reviewed by me and considered in my medical decision making (see chart for details).     *** Final Clinical Impressions(s) / UC Diagnoses   Final diagnoses:  Urinary frequency  Vaginal lesion  Vaginal discharge     Discharge Instructions      Your urinalysis did not indicate an obvious urinary tract infection.  Further lab work is pending.  You will be contacted if the pending lab work is abnormal.  You also access to your results via MyChart. Use medication as prescribed. Recommend avoiding sexual intercourse while symptoms persist. Recommend warm Epsom salt soaks or sitz bath while symptoms persist. Do not scrub the vaginal area while symptoms persist.  Cleanse the vaginal area with nonscented soaps or warm water. If symptoms fail to improve with this treatment, it is recommended that you follow-up with your primary care physician or gynecologist for further evaluation. Follow-up as needed.     ED Prescriptions     Medication Sig Dispense Auth.  Provider   nystatin-triamcinolone ointment (MYCOLOG) Apply 1 Application topically 2 (two) times daily. 60 g Leath-Warren, Belen Bowers, NP      PDMP not reviewed this encounter.

## 2024-04-09 LAB — URINE CULTURE: Culture: <10000 — AB

## 2024-04-09 LAB — CERVICOVAGINAL ANCILLARY ONLY
Bacterial Vaginitis (gardnerella): POSITIVE — AB
Candida Glabrata: NEGATIVE
Candida Vaginitis: POSITIVE — AB
Comment: NEGATIVE
Comment: NEGATIVE
Comment: NEGATIVE

## 2024-04-10 ENCOUNTER — Telehealth (HOSPITAL_COMMUNITY): Payer: Self-pay

## 2024-04-10 MED ORDER — FLUCONAZOLE 150 MG PO TABS: 150.0000 mg | ORAL_TABLET | Freq: Once | ORAL | 0 refills | Status: AC

## 2024-04-10 MED ORDER — METRONIDAZOLE 500 MG PO TABS: 500.0000 mg | ORAL_TABLET | Freq: Two times a day (BID) | ORAL | 0 refills | Status: AC

## 2024-04-10 NOTE — Telephone Encounter (Signed)
 Per protocol, pt requires tx with metronidazole and Diflucan.  Rx sent to pharmacy on file.

## 2024-04-12 LAB — HSV CULTURE AND TYPING

## 2024-04-15 ENCOUNTER — Encounter: Payer: Self-pay | Admitting: Advanced Practice Midwife

## 2024-04-15 ENCOUNTER — Other Ambulatory Visit (HOSPITAL_COMMUNITY)
Admission: RE | Admit: 2024-04-15 | Discharge: 2024-04-15 | Disposition: A | Discharge status: Home / Self Care | Source: Ambulatory Visit | Attending: Advanced Practice Midwife | Admitting: Advanced Practice Midwife

## 2024-04-15 ENCOUNTER — Ambulatory Visit: Admitting: Advanced Practice Midwife

## 2024-04-15 VITALS — BP 121/85 | HR 92 | Ht 64.0 in | Wt 225.3 lb

## 2024-04-15 DIAGNOSIS — Z789 Other specified health status: Secondary | ICD-10-CM | POA: Diagnosis not present

## 2024-04-15 DIAGNOSIS — R87612 Low grade squamous intraepithelial lesion on cytologic smear of cervix (LGSIL): Secondary | ICD-10-CM | POA: Diagnosis present

## 2024-04-15 NOTE — Progress Notes (Signed)
 GYN VISIT Patient name: Amy [name] MRN [id number]  Date of birth: 11/13/1996 Chief Complaint:   Infertility (Needs pap/)  History of Present Illness:   Amy [name] is a 28 y.o. G1P0 Caucasian female being seen today for concern for infertility; has been TTC x 1 year now with new partner (married at end of 2024 but trying since May 2024); has hasn't fathered children; her cycles are regular q 28-30d but she hasn't tracked them on an app and didn't fully focus on her ovulation time frame.  Chart review shows LSIL +HRHPV Pap in Feb 2024 with no f/u (she didn't get the message about needing a colposcopy).  Patient's last menstrual period was 03/07/2024 (exact date). The current method of family planning is none.  Last pap Feb 2024. Results were: LSIL w/ HRHPV positive: other (not 16, 18/45)     04/15/2024    2:43 PM 03/27/2024   10:59 AM 10/23/2023    1:55 PM 09/25/2023    2:01 PM 05/27/2023    4:04 PM  Depression screen PHQ 2/9  Decreased Interest 0 2 0 0 0  Down, Depressed, Hopeless 0 0 0 0 0  PHQ - 2 Score 0 2 0 0 0  Altered sleeping 0 3  0 0  Tired, decreased energy 0 3  0 0  Change in appetite 0 3  3 0  Feeling bad or failure about yourself  0 0  0 0  Trouble concentrating 0 2  1 0  Moving slowly or fidgety/restless 0 0  0 0  Suicidal thoughts 0 0  0 0  PHQ-9 Score 0 13  4 0  Difficult doing work/chores  Very difficult  Somewhat difficult Not difficult at all        04/15/2024    2:44 PM 03/27/2024   10:59 AM 09/25/2023    2:01 PM 05/27/2023    4:04 PM  GAD 7 : Generalized Anxiety Score  Nervous, Anxious, on Edge 0 2 3 0  Control/stop worrying 0 3 1 0  Worry too much - different things 0 3 3 0  Trouble relaxing 0 3 3 0  Restless 0 2 2 0  Easily annoyed or irritable 0 3 3 0  Afraid - awful might happen 0 0 0 0  Total GAD 7 Score 0 16 15 0  Anxiety Difficulty  Very difficult Very difficult Not difficult at all     Review of Systems:    Pertinent items are noted in HPI Denies fever/chills, dizziness, headaches, visual disturbances, fatigue, shortness of breath, chest pain, abdominal pain, vomiting, abnormal vaginal discharge/itching/odor/irritation, problems with periods, bowel movements, urination, or intercourse unless otherwise stated above.  Pertinent History Reviewed:  Reviewed past medical,surgical, social, obstetrical and family history.  Reviewed problem list, medications and allergies. Physical Assessment:   Vitals:   04/15/24 1438  BP: 121/85  Pulse: 92  Weight: 225 lb 4.8 oz (102.2 kg)  Height: 5\' 4"  (1.626 m)  Body mass index is 38.67 kg/m.       Physical Examination:   General appearance: alert, well appearing, and in no distress  Mental status: alert, oriented to person, place, and time  Skin: warm & dry   Cardiovascular: normal heart rate noted  Respiratory: normal respiratory effort, no distress  Abdomen: soft, non-tender   Pelvic: normal external genitalia, vulva, vagina, cervix, uterus and adnexa  Extremities: no edema   Chaperone: Steffanie Edouard  No results found for this or  any previous visit (from the past 24 hours).  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   No orders of the defined types were placed in this encounter.   Return for 1 month visit w MD- infertility f/u.  Jolayne Natter CNM 04/15/2024 3:19 PM

## 2024-04-21 LAB — CYTOLOGY - PAP
Adequacy: ABSENT
Chlamydia: NEGATIVE
Comment: NEGATIVE
Comment: NEGATIVE
Comment: NORMAL
Diagnosis: NEGATIVE
High risk HPV: NEGATIVE
Neisseria Gonorrhea: NEGATIVE

## 2024-04-22 ENCOUNTER — Ambulatory Visit: Admitting: Family Medicine

## 2024-04-22 ENCOUNTER — Encounter: Payer: Self-pay | Admitting: Advanced Practice Midwife

## 2024-04-30 ENCOUNTER — Ambulatory Visit: Payer: Self-pay | Admitting: Family Medicine

## 2024-05-01 ENCOUNTER — Ambulatory Visit: Admitting: Family Medicine

## 2024-05-15 ENCOUNTER — Encounter: Payer: Self-pay | Admitting: Obstetrics & Gynecology

## 2024-05-15 ENCOUNTER — Ambulatory Visit: Admitting: Obstetrics & Gynecology

## 2024-05-18 NOTE — Progress Notes (Signed)
 This encounter was created in error - please disregard.

## 2024-05-21 ENCOUNTER — Ambulatory Visit: Admitting: Family Medicine

## 2024-05-27 ENCOUNTER — Ambulatory Visit (INDEPENDENT_AMBULATORY_CARE_PROVIDER_SITE_OTHER): Payer: Self-pay | Admitting: Allergy & Immunology

## 2024-05-27 ENCOUNTER — Encounter: Payer: Self-pay | Admitting: Allergy & Immunology

## 2024-05-27 VITALS — BP 108/66 | HR 118 | Temp 98.7°F | Resp 18 | Ht 62.6 in | Wt 230.0 lb

## 2024-05-27 DIAGNOSIS — J4550 Severe persistent asthma, uncomplicated: Secondary | ICD-10-CM

## 2024-05-27 DIAGNOSIS — J310 Chronic rhinitis: Secondary | ICD-10-CM | POA: Diagnosis not present

## 2024-05-27 MED ORDER — TEZEPELUMAB-EKKO 210 MG/1.91ML ~~LOC~~ SOSY: 210.0000 mg | PREFILLED_SYRINGE | SUBCUTANEOUS | Status: DC

## 2024-05-27 NOTE — Patient Instructions (Addendum)
 1. Severe persistent asthma, uncomplicated  - Lung testing looks fairly well today and there was minimal improvement with the albuterol . - However, symptoms were certainly consistent with asthma. - We are going to stop the Dulera  and start Trelegy 200mcg one puff once daily (sample provided). - This contains three medications to help control your lung symptoms. - Xolair consent signed today. - This has been out for the longest (more than 20 years) and it what we swithc patients to when they become pregnant. - We need to get some blood work to be able to dose the Xolair.  - We are going to get some labs to look for difficult to control causes of asthma.  - Daily controller medication(s): Trelegy 200/62.5/25mcg one puff once daily - Prior to physical activity: albuterol  2 puffs 10-15 minutes before physical activity. - Rescue medications: albuterol  4 puffs every 4-6 hours as needed - Asthma control goals:  * Full participation in all desired activities (may need albuterol  before activity) * Albuterol  use two time or less a week on average (not counting use with activity) * Cough interfering with sleep two time or less a month * Oral steroids no more than once a year * No hospitalizations  2. Chronic rhinitis - Because of insurance stipulations, we cannot do skin testing on the same day as your first visit. - We are all working to fight this, but for now we need to do two separate visits.  - We will know more after we do testing at the next visit.  - The skin testing visit can be squeezed in at your convenience.  - Then we can make a more full plan to address all of your symptoms. - Be sure to stop your antihistamines for 3 days before this appointment.   3. Return in about 2 weeks (around 06/10/2024) for SKIN TESTING (1-55). You can have the follow up appointment with Dr. Idolina Maker or a Nurse Practicioner (our Nurse Practitioners are excellent and always have Physician oversight!).     Please inform us  of any Emergency Department visits, hospitalizations, or changes in symptoms. Call us  before going to the ED for breathing or allergy symptoms since we might be able to fit you in for a sick visit. Feel free to contact us  anytime with any questions, problems, or concerns.  It was a pleasure to meet you today!  Websites that have reliable patient information: 1. American Academy of Asthma, Allergy, and Immunology: www.aaaai.org 2. Food Allergy Research and Education (FARE): foodallergy.org 3. Mothers of Asthmatics: http://www.asthmacommunitynetwork.org 4. American College of Allergy, Asthma, and Immunology: www.acaai.org      "Like" us  on Facebook and Instagram for our latest updates!      A healthy democracy works best when [employer] participate! Make sure you are registered to vote! If you have moved or changed any of your contact information, you will need to get this updated before voting! Scan the QR codes below to learn more!

## 2024-05-27 NOTE — Progress Notes (Unsigned)
 Immunotherapy   Patient Details  Name: Amy [name] MRN: [id number] Date of Birth: 02-29-1996  05/27/2024  Amy [name] started injections for  Tezspire Following schedule: Every twenty eight days. Frequency:Every four weeks. Epi-Pen:Not needed Consent signed in office today and patient instructions given. Patient decided not to go through the Tezspire as she is try to conceive.    Brutus Caprice 05/27/2024, 2:27 PM
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
## 2024-05-28 ENCOUNTER — Encounter: Payer: Self-pay | Admitting: Allergy & Immunology

## 2024-05-28 MED ORDER — TRELEGY ELLIPTA 200-62.5-25 MCG/ACT IN AEPB: 1.0000 | INHALATION_SPRAY | Freq: Every day | RESPIRATORY_TRACT | 5 refills | Status: DC

## 2024-06-01 ENCOUNTER — Telehealth: Payer: Self-pay

## 2024-06-01 ENCOUNTER — Encounter: Payer: Self-pay | Admitting: Allergy & Immunology

## 2024-06-01 ENCOUNTER — Ambulatory Visit: Admitting: *Deleted

## 2024-06-01 ENCOUNTER — Encounter: Payer: Self-pay | Admitting: *Deleted

## 2024-06-01 VITALS — BP 128/82 | HR 89 | Ht 64.0 in | Wt 231.0 lb

## 2024-06-01 DIAGNOSIS — Z3201 Encounter for pregnancy test, result positive: Secondary | ICD-10-CM

## 2024-06-01 LAB — CBC WITH DIFFERENTIAL/PLATELET
Basophils Absolute: 0.1 10*3/uL (ref 0.0–0.2)
Basos: 1 %
EOS (ABSOLUTE): 0.8 10*3/uL — ABNORMAL HIGH (ref 0.0–0.4)
Eos: 7 %
Hematocrit: 44.5 % (ref 34.0–46.6)
Hemoglobin: 14.5 g/dL (ref 11.1–15.9)
Immature Grans (Abs): 0 10*3/uL (ref 0.0–0.1)
Immature Granulocytes: 0 %
Lymphocytes Absolute: 2.6 10*3/uL (ref 0.7–3.1)
Lymphs: 23 %
MCH: 27.7 pg (ref 26.6–33.0)
MCHC: 32.6 g/dL (ref 31.5–35.7)
MCV: 85 fL (ref 79–97)
Monocytes Absolute: 0.7 10*3/uL (ref 0.1–0.9)
Monocytes: 6 %
Neutrophils Absolute: 7.4 10*3/uL — ABNORMAL HIGH (ref 1.4–7.0)
Neutrophils: 63 %
Platelets: 197 10*3/uL (ref 150–450)
RBC: 5.23 x10E6/uL (ref 3.77–5.28)
RDW: 13 % (ref 11.7–15.4)
WBC: 11.7 10*3/uL — ABNORMAL HIGH (ref 3.4–10.8)

## 2024-06-01 LAB — IGE: IgE (Immunoglobulin E), Serum: 3770 [IU]/mL — ABNORMAL HIGH (ref 6–495)

## 2024-06-01 LAB — ASPERGILLUS PRECIPITINS
A.Fumigatus #1 Abs: NEGATIVE
Aspergillus Flavus Antibodies: NEGATIVE
Aspergillus Niger Antibodies: NEGATIVE
Aspergillus glaucus IgG: NEGATIVE
Aspergillus nidulans IgG: NEGATIVE
Aspergillus terreus IgG: NEGATIVE

## 2024-06-01 LAB — ALPHA-1-ANTITRYPSIN: A-1 Antitrypsin: 140 mg/dL (ref 100–188)

## 2024-06-01 LAB — ANCA TITERS
Atypical pANCA: <1:20 {titer}
C-ANCA: <1:20 {titer}
P-ANCA: <1:20 {titer}

## 2024-06-01 LAB — POCT URINE PREGNANCY: Preg Test, Ur: POSITIVE — AB

## 2024-06-01 NOTE — Telephone Encounter (Signed)
*  Asthma/Allergy  Pharmacy Patient Advocate Encounter   Received notification from CoverMyMeds that prior authorization for Trelegy Ellipta  200-62.5-25MCG/ACT aerosol powder  is required/requested.   Insurance verification completed.   The patient is insured through [facility] .   Per test claim: PA required; PA started via CoverMyMeds. KEY [id number] . Please see clinical question(s) below that I am not finding the answer to in their chart and advise.   *patient sent message to office saying that the Trelegy sample has not been working-pending response from office

## 2024-06-01 NOTE — Progress Notes (Signed)
   NURSE VISIT- PREGNANCY CONFIRMATION   SUBJECTIVE:  Amy [name] is a 28 y.o. G9P1001 female at [redacted]w[redacted]d by uncertain LMP of Patient's last menstrual period was 04/17/2024. Here for pregnancy confirmation.  Home pregnancy test: positive x 2  She reports cramping.  She is not taking prenatal vitamins.    OBJECTIVE:  BP 128/82 (BP Location: Right Arm, Patient Position: Sitting, Cuff Size: Large)   Pulse 89   Ht 5\' 4"  (1.626 m)   Wt 231 lb (104.8 kg)   LMP 04/17/2024   BMI 39.65 kg/m   Appears well, in no apparent distress  Results for orders placed or performed in visit on 06/01/24 (from the past 24 hours)  POCT urine pregnancy   Collection Time: 06/01/24  1:49 PM  Result Value Ref Range   Preg Test, Ur Positive (A) Negative    ASSESSMENT: Positive pregnancy test, [redacted]w[redacted]d by LMP    PLAN: Schedule for dating ultrasound in 2 weeks Prenatal vitamins: plans to begin OTC ASAP   Nausea medicines: not currently needed   OB packet given: Yes  Alphonso Aschoff  06/01/2024 1:53 PM

## 2024-06-03 ENCOUNTER — Telehealth: Admitting: Physician Assistant

## 2024-06-03 DIAGNOSIS — Z3A01 Less than 8 weeks gestation of pregnancy: Secondary | ICD-10-CM

## 2024-06-03 DIAGNOSIS — O219 Vomiting of pregnancy, unspecified: Secondary | ICD-10-CM

## 2024-06-03 MED ORDER — DOXYLAMINE-PYRIDOXINE 10-10 MG PO TBEC
DELAYED_RELEASE_TABLET | ORAL | 0 refills | Status: DC
Start: 2024-06-03 — End: 2024-06-17

## 2024-06-03 NOTE — Progress Notes (Signed)
 E-Visit for Nausea and Vomiting   We are sorry that you are not feeling well. Here is how we plan to help!  Based on what you have shared with me it looks like you have a Virus that is irritating your GI tract.  Vomiting is the forceful emptying of a portion of the stomach's content through the mouth.  Although nausea and vomiting can make you feel miserable, it's important to remember that these are not diseases, but rather symptoms of an underlying illness.  When we treat short term symptoms, we always caution that any symptoms that persist should be fully evaluated in a medical office.  I have prescribed a medication that will help alleviate your symptoms and allow you to stay hydrated:  Diclegis 10-10 mg tablet Take 1 tablet daily at bedtime. If not controlling symptoms Take 2 tablets at bedtime on Day 2. If still not controlling nausea Take 1 tablet in the morning and 2 tablets at bedtime on Day 3.   This is safest option for pregnancy and your symptoms may be associated with morning sickness.   HOME CARE: Drink clear liquids.  This is very important! Dehydration (the lack of fluid) can lead to a serious complication.  Start off with 1 tablespoon every 5 minutes for 8 hours. You may begin eating bland foods after 8 hours without vomiting.  Start with saltine crackers, white bread, rice, mashed potatoes, applesauce. After 48 hours on a bland diet, you may resume a normal diet. Try to go to sleep.  Sleep often empties the stomach and relieves the need to vomit.  GET HELP RIGHT AWAY IF:  Your symptoms do not improve or worsen within 2 days after treatment. You have a fever for over 3 days. You cannot keep down fluids after trying the medication.  MAKE SURE YOU:  Understand these instructions. Will watch your condition. Will get help right away if you are not doing well or get worse.    Thank you for choosing an e-visit.  Your e-visit answers were reviewed by a board certified  advanced clinical practitioner to complete your personal care plan. Depending upon the condition, your plan could have included both over the counter or prescription medications.  Please review your pharmacy choice. Make sure the pharmacy is open so you can pick up prescription now. If there is a problem, you may contact your provider through [employer] and have the prescription routed to another pharmacy.  Your safety is important to us . If you have drug allergies check your prescription carefully.   For the next 24 hours you can use MyChart to ask questions about today's visit, request a non-urgent call back, or ask for a work or school excuse. You will get an email in the next two days asking about your experience. I hope that your e-visit has been valuable and will speed your recovery.    I have spent 5 minutes in review of e-visit questionnaire, review and updating patient chart, medical decision making and response to patient.   Angelia Kelp, PA-C

## 2024-06-05 ENCOUNTER — Ambulatory Visit: Payer: Self-pay | Admitting: Allergy & Immunology

## 2024-06-08 ENCOUNTER — Encounter: Payer: Self-pay | Admitting: Allergy & Immunology

## 2024-06-08 ENCOUNTER — Emergency Department (HOSPITAL_COMMUNITY)
Admission: EM | Admit: 2024-06-08 | Discharge: 2024-06-08 | Disposition: A | Discharge status: Home / Self Care | Attending: Emergency Medicine | Admitting: Emergency Medicine

## 2024-06-08 ENCOUNTER — Emergency Department (HOSPITAL_COMMUNITY)

## 2024-06-08 ENCOUNTER — Other Ambulatory Visit: Payer: Self-pay

## 2024-06-08 DIAGNOSIS — J4541 Moderate persistent asthma with (acute) exacerbation: Secondary | ICD-10-CM | POA: Diagnosis not present

## 2024-06-08 DIAGNOSIS — Z87891 Personal history of nicotine dependence: Secondary | ICD-10-CM | POA: Insufficient documentation

## 2024-06-08 DIAGNOSIS — J45901 Unspecified asthma with (acute) exacerbation: Secondary | ICD-10-CM

## 2024-06-08 DIAGNOSIS — R0602 Shortness of breath: Secondary | ICD-10-CM | POA: Diagnosis present

## 2024-06-08 LAB — CBC
HCT: 41 % (ref 36.0–46.0)
Hemoglobin: 14.1 g/dL (ref 12.0–15.0)
MCH: 28.1 pg (ref 26.0–34.0)
MCHC: 34.4 g/dL (ref 30.0–36.0)
MCV: 81.8 fL (ref 80.0–100.0)
Platelets: 183 10*3/uL (ref 150–400)
RBC: 5.01 MIL/uL (ref 3.87–5.11)
RDW: 13.5 % (ref 11.5–15.5)
WBC: 14 10*3/uL — ABNORMAL HIGH (ref 4.0–10.5)
nRBC: 0 % (ref 0.0–0.2)

## 2024-06-08 LAB — BASIC METABOLIC PANEL WITH GFR
Anion gap: 11 (ref 5–15)
BUN: 19 mg/dL (ref 6–20)
CO2: 21 mmol/L — ABNORMAL LOW (ref 22–32)
Calcium: 8.9 mg/dL (ref 8.9–10.3)
Chloride: 105 mmol/L (ref 98–111)
Creatinine, Ser: 1.01 mg/dL — ABNORMAL HIGH (ref 0.44–1.00)
GFR, Estimated: >60 mL/min (ref >60)
Glucose, Bld: 106 mg/dL — ABNORMAL HIGH (ref 70–99)
Potassium: 3.8 mmol/L (ref 3.5–5.1)
Sodium: 137 mmol/L (ref 135–145)

## 2024-06-08 LAB — PROTIME-INR
INR: 1 (ref 0.8–1.2)
Prothrombin Time: 13.1 s (ref 11.4–15.2)

## 2024-06-08 MED ORDER — METHYLPREDNISOLONE SODIUM SUCC 125 MG IJ SOLR
125.0000 mg | Freq: Once | INTRAMUSCULAR | Status: DC
  Filled 2024-06-08: qty 2

## 2024-06-08 MED ORDER — ALBUTEROL SULFATE HFA 108 (90 BASE) MCG/ACT IN AERS
2.0000 | INHALATION_SPRAY | Freq: Once | RESPIRATORY_TRACT | Status: AC
  Administered 2024-06-08: 2 via RESPIRATORY_TRACT
  Filled 2024-06-08: qty 6.7

## 2024-06-08 MED ORDER — IPRATROPIUM-ALBUTEROL 0.5-2.5 (3) MG/3ML IN SOLN
3.0000 mL | Freq: Once | RESPIRATORY_TRACT | Status: AC
  Administered 2024-06-08: 3 mL via RESPIRATORY_TRACT
  Filled 2024-06-08: qty 3

## 2024-06-08 MED ORDER — ALBUTEROL SULFATE HFA 108 (90 BASE) MCG/ACT IN AERS: 2.0000 | INHALATION_SPRAY | RESPIRATORY_TRACT | 1 refills | Status: DC | PRN

## 2024-06-08 NOTE — ED Provider Notes (Signed)
 [facility] Emergency Department Provider Note MRN:  [id number]  Arrival date & time: 06/08/24     Chief Complaint   Shortness of Breath   History of Present Illness   Amy [name] is a 28 y.o. year-old female with a history of asthma presenting to the ED with chief complaint of shortness of breath.  Shortness of breath that she attributes to her asthma flaring up recently for the past day or so.  Not responding to home breathing treatments.  Increased cough, denies fever.  No chest pain.  Review of Systems  A thorough review of systems was obtained and all systems are negative except as noted in the HPI and PMH.   Patient's Health History    Past Medical History:  Diagnosis Date   Asthma    Eczema    Family history of adverse reaction to anesthesia    Migraines    Narcolepsy    PONV (postoperative nausea and vomiting)    Pregnancy 12/27/2019   PTSD (post-traumatic stress disorder)    Seasonal allergies     Past Surgical History:  Procedure Laterality Date   CHOLECYSTECTOMY N/A 06/20/2020   Procedure: LAPAROSCOPIC CHOLECYSTECTOMY;  Surgeon: Alanda Allegra, MD;  Location: [facility];  Service: General;  Laterality: N/A;   TONSILLECTOMY     WISDOM TOOTH EXTRACTION      Family History  Problem Relation Age of Onset   Heart disease Maternal Grandmother    Heart disease Maternal Grandfather    Diabetes Maternal Grandfather    Asthma Father    Eczema Father    Diabetes Father    Cancer Father        basal cell cancer   Asthma Mother    Diabetes Mother    Hypertension Mother    Irritable bowel syndrome Mother    Crohn's disease Maternal Aunt    Cancer Maternal Uncle        brain    Social History   Socioeconomic History   Marital status: Married    Spouse name: Not on file   Number of children: Not on file   Years of education: Not on file   Highest education level: Not on file  Occupational History   Not on file  Tobacco  Use   Smoking status: Former    Types: Cigarettes   Smokeless tobacco: Never   Tobacco comments:    Stopped 3 years ago   Vaping Use   Vaping status: Never Used  Substance and Sexual Activity   Alcohol use: Not Currently    Comment: occ   Drug use: Not Currently    Types: Marijuana    Comment: Used marijuana in the past   Sexual activity: Yes    Birth control/protection: None  Other Topics Concern   Not on file  Social History Narrative   Not on file   Social Drivers of Health   Financial Resource Strain: Medium Risk (10/11/2020)   Overall Financial Resource Strain (CARDIA)    Difficulty of Paying Living Expenses: Somewhat hard  Food Insecurity: No Food Insecurity (12/20/2023)   Hunger Vital Sign    Worried About Running Out of Food in the Last Year: Never true    Ran Out of Food in the Last Year: Never true  Transportation Needs: No Transportation Needs (12/20/2023)   PRAPARE - Administrator, Civil Service (Medical): No    Lack of Transportation (Non-Medical): No  Physical Activity: Insufficiently Active (10/11/2020)  Exercise Vital Sign    Days of Exercise per Week: 3 days    Minutes of Exercise per Session: 30 min  Stress: No Stress Concern Present (10/11/2020)   [employer] of Occupational Health - Occupational Stress Questionnaire    Feeling of Stress : Only a little  Social Connections: Moderately Isolated (10/11/2020)   Social Connection and Isolation Panel    Frequency of Communication with Friends and Family: More than three times a week    Frequency of Social Gatherings with Friends and Family: More than three times a week    Attends Religious Services: More than 4 times per year    Active Member of [employer] or Organizations: No    Attends Banker Meetings: Never    Marital Status: Never married  Intimate Partner Violence: Not At Risk (12/20/2023)   Humiliation, Afraid, Rape, and Kick questionnaire    Fear of Current or  Ex-Partner: No    Emotionally Abused: No    Physically Abused: No    Sexually Abused: No     Physical Exam   Vitals:   06/08/24 0242 06/08/24 0302  BP: 137/87 116/80  Pulse: (!) 112 91  Resp: (!) 27 (!) 22  Temp: 99.5 F (37.5 C)   SpO2: 94% 97%    CONSTITUTIONAL: Well-appearing, seems anxious NEURO/PSYCH:  Alert and oriented x 3, no focal deficits EYES:  eyes equal and reactive ENT/NECK:  no LAD, no JVD CARDIO: Tachycardic rate, well-perfused, normal S1 and S2 PULM: Scattered wheezes GI/GU:  non-distended, non-tender MSK/SPINE:  No gross deformities, no edema SKIN:  no rash, atraumatic   *Additional and/or pertinent findings included in MDM below  Diagnostic and Interventional Summary    EKG Interpretation Date/Time:  Monday June 08 2024 03:01:40 EDT Ventricular Rate:  93 PR Interval:  157 QRS Duration:  86 QT Interval:  375 QTC Calculation: 467 R Axis:   70  Text Interpretation: Sinus rhythm Confirmed by Gwenetta Lennert [phone]) on 06/08/2024 3:21:45 AM       Labs Reviewed  CBC - Abnormal; Notable for the following components:      Result Value   WBC 14.0 (*)    All other components within normal limits  BASIC METABOLIC PANEL WITH GFR - Abnormal; Notable for the following components:   CO2 21 (*)    Glucose, Bld 106 (*)    Creatinine, Ser 1.01 (*)    All other components within normal limits  PROTIME-INR    No orders to display    Medications  albuterol  (VENTOLIN  HFA) 108 (90 Base) MCG/ACT inhaler 2 puff (has no administration in time range)  ipratropium-albuterol  (DUONEB) 0.5-2.5 (3) MG/3ML nebulizer solution 3 mL (3 mLs Nebulization Given 06/08/24 0301)     Procedures  /  Critical Care Procedures  ED Course and Medical Decision Making  Initial Impression and Ddx Favoring asthma exacerbation, PE considered but felt to be less likely.  Patient seems anxious as well, some wheezing on exam.  Providing symptomatic management breathing treatment will  reassess.  Past medical/surgical history that increases complexity of ED encounter: Asthma, currently pregnant  Interpretation of Diagnostics I personally reviewed the EKG and my interpretation is as follows: Sinus rhythm  No significant blood count or electrolyte disturbance.  Mild leukocytosis.  X-ray declined by patient.  Patient Reassessment and Ultimate Disposition/Management     Patient with significant improvement after only 1 DuoNeb.  Suspect large component of anxiety with regard to patient's initial work of breathing being so  increased with only faint wheezing.  Initially had considered giving her steroids in the setting of severe exacerbation however with this swift recovery she likely does not require them and she is concerned about the effects to the fetus and so steroids withheld.  Patient doing much better, no longer tachycardic, breathing comfortably.  Appropriate for discharge.  Patient management required discussion with the following services or consulting groups:  None  Complexity of Problems Addressed Acute illness or injury that poses threat of life of bodily function  Additional Data Reviewed and Analyzed Further history obtained from: Prior labs/imaging results  Additional Factors Impacting ED Encounter Risk Prescriptions and Consideration of hospitalization  Merrick Abe. Harless Lien, MD [facility] Emergency Medicine [facility] [email or web address]  Final Clinical Impressions(s) / ED Diagnoses     ICD-10-CM   1. Moderate asthma with exacerbation, unspecified whether persistent  J45.901       ED Discharge Orders          Ordered    albuterol  (VENTOLIN  HFA) 108 (90 Base) MCG/ACT inhaler  Every 4 hours PRN        06/08/24 0402             Discharge Instructions Discussed with and Provided to Patient:     Discharge Instructions      You were evaluated in the Emergency Department and after careful evaluation, we did not find any  emergent condition requiring admission or further testing in the hospital.  Your exam/testing today is overall reassuring.  Symptoms likely due to flare of your asthma.  Importantly follow-up with your primary care doctor to discuss long-term management.  Use the albuterol  inhaler as needed.  Please return to the Emergency Department if you experience any worsening of your condition.   Thank you for allowing us  to be a part of your care.       Edson Graces, MD 06/08/24 [id number]

## 2024-06-08 NOTE — Telephone Encounter (Signed)
 Per Dr. Idolina Maker note to pt- discontinue Trelegy

## 2024-06-08 NOTE — Telephone Encounter (Signed)
 Ok to double book. Please change to OV on my schedule. Please have her return to the ED for any worsening symptoms. Thank you

## 2024-06-08 NOTE — Discharge Instructions (Signed)
 You were evaluated in the Emergency Department and after careful evaluation, we did not find any emergent condition requiring admission or further testing in the hospital.  Your exam/testing today is overall reassuring.  Symptoms likely due to flare of your asthma.  Importantly follow-up with your primary care doctor to discuss long-term management.  Use the albuterol  inhaler as needed.  Please return to the Emergency Department if you experience any worsening of your condition.   Thank you for allowing us  to be a part of your care.

## 2024-06-08 NOTE — ED Triage Notes (Signed)
 Pt POV from home c/o SOB that started yesterday. Reports hx of asthma, has been doing breathing treatments every hour with no relief. States she has not used rescue inhaler because she was unable to refill it d/t insurance problems.    Pt states she is currently pregnant but is unsure how far along. LMP 04/17/24

## 2024-06-09 NOTE — Telephone Encounter (Signed)
 I called the patient and she has been scheduled.

## 2024-06-12 ENCOUNTER — Ambulatory Visit (INDEPENDENT_AMBULATORY_CARE_PROVIDER_SITE_OTHER): Admitting: Family Medicine

## 2024-06-12 ENCOUNTER — Encounter: Payer: Self-pay | Admitting: Family Medicine

## 2024-06-12 VITALS — BP 120/70 | HR 102 | Temp 98.0°F | Resp 22 | Wt 230.1 lb

## 2024-06-12 DIAGNOSIS — B999 Unspecified infectious disease: Secondary | ICD-10-CM | POA: Insufficient documentation

## 2024-06-12 DIAGNOSIS — J310 Chronic rhinitis: Secondary | ICD-10-CM | POA: Insufficient documentation

## 2024-06-12 DIAGNOSIS — J4551 Severe persistent asthma with (acute) exacerbation: Secondary | ICD-10-CM

## 2024-06-12 HISTORY — DX: Chronic rhinitis: J31.0

## 2024-06-12 MED ORDER — BUDESONIDE 32 MCG/ACT NA SUSP: 1.0000 | Freq: Every day | NASAL | 5 refills | Status: DC | PRN

## 2024-06-12 MED ORDER — FORMOTEROL FUMARATE 20 MCG/2ML IN NEBU: 20.0000 ug | INHALATION_SOLUTION | Freq: Two times a day (BID) | RESPIRATORY_TRACT | 5 refills | Status: DC

## 2024-06-12 MED ORDER — PREDNISONE 10 MG PO TABS
ORAL_TABLET | ORAL | 0 refills | Status: DC
Start: 2024-06-12 — End: 2024-07-16

## 2024-06-12 MED ORDER — BUDESONIDE 0.5 MG/2ML IN SUSP: 0.5000 mg | Freq: Two times a day (BID) | RESPIRATORY_TRACT | 5 refills | Status: AC

## 2024-06-12 NOTE — Patient Instructions (Addendum)
 Asthma Continue albuterol  2 puffs once every 4 hours if needed for cough or wheeze You may use albuterol  2 puffs 5 to 15 minutes before activity to decrease cough or wheeze Begin budesonide  0.5 mg via nebulizer 2-4 times a day Begin Perforomist  by nebulizer twice daily If your symptoms are not improved with the treatment plan as listed above, begin the prednisone  taper  Chronic rhinitis: Thank you yeah sounds like goodness Consider saline nasal rinses as needed for nasal symptoms. Use this before any medicated nasal sprays for best result Begin Rhinocort  1 spray in each nostril once a day if needed for a stuffy nose A lab order has been entered to help us  evaluate your environmental allergens.  Will call you when the result becomes available  Recurrent infections Lab orders have been placed to help us  evaluate your immune system.  We will call you when the results become available. Call the clinic if this treatment plan is not working well for you.  Follow up in 1 week or sooner if needed.

## 2024-06-12 NOTE — Progress Notes (Signed)
 Swelling  [address] [place] [address] Dept: [phone]  FOLLOW UP NOTE  Patient ID: Amy [name], female    DOB: 1996/01/05  Age: 28 y.o. MRN: [id number] Date of Office Visit: 06/12/2024  Assessment  Chief Complaint: Asthma  HPI Amy [name] is a 28 year old female who presents to the clinic for a follow-up visit.  She was last seen in this clinic on 05/27/2024 by Dr. Idolina [name] as a new patient for evaluation of asthma and chronic rhinitis.  At that time, she was started on Trelegy 200-1 puff once a day and Xolair consent was signed.  In the interim, she visited the emergency department at any [facility] on 06/08/2024 for evaluation of asthma with acute exacerbation for which she received nebulized medications with relief of symptoms.  Patient refused x-ray during this ED visit.  Patient is [redacted] weeks pregnant at today's visit.  At the visit in the clinic today she reports her asthma has been poorly controlled with symptoms including shortness of breath which is worse with activity, wheeze with activity, and frequent dry cough.  She continues Trelegy 200-1 puff once a day and is using albuterol  frequently.  She reports a history of asthma beginning around age 77 and worsening over the last 5 years.  At her last visit, we were considering beginning Xolair for asthma control.  Allergic rhinitis is reported as poorly controlled with symptoms including clear rhinorrhea, nasal congestion, sneezing, and postnasal drainage.  She is not currently taking an antihistamine, using a nasal steroid spray, or using nasal saline rinses.  She does report previous episodes of epistaxis while using nasal steroid spray, however, she reports poor application technique as well.  She has not had environmental allergy testing at this time.  She is on the schedule for environmental allergy skin testing for next week.  We will change this appointment to an office visit to  reevaluate her breathing at that time.  We agreed to evaluate environmental allergies via lab work at today's visit.  She reports 5-6 sinus infections requiring antibiotic or steroid each year.  She reports that she is up-to-date on childhood and adult vaccinations.  She has not previously seen an ENT specialist.  She continues to follow-up with her OB/GYN team at [facility].  Her current medications are listed in the chart.  Drug Allergies:  Allergies  Allergen Reactions   Benzyl Alcohol     Burns skin   Cough Syrup [Guaifenesin ] Nausea And Vomiting   Haemophilus Influenzae Vaccines     Hospitalized for 14 days asthma attack.  Allergic to ALL VACCINES   Other Nausea And Vomiting    Any coated medications   Petrolatum    Iodine Rash    Physical Exam: BP 120/70   Pulse (!) 102   Temp 98 F (36.7 C)   Resp (!) 22   Wt 230 lb 2 oz (104.4 kg)   LMP 04/17/2024   SpO2 96%   BMI 39.50 kg/m    Physical Exam Vitals reviewed.  Constitutional:      Appearance: Normal appearance.  HENT:     Head: Normocephalic and atraumatic.     Right Ear: Tympanic membrane normal.     Left Ear: Tympanic membrane normal.     Nose:     Comments: Bilateral nares slightly erythematous with thin clear nasal drainage noted.  Pharynx normal.  Ears normal.  Eyes normal.  Mouth/Throat:     Pharynx: Oropharynx is clear.   Eyes:     Conjunctiva/sclera: Conjunctivae normal.    Cardiovascular:     Rate and Rhythm: Regular rhythm. Tachycardia present.     Heart sounds: Normal heart sounds. No murmur heard.    Comments: Slightly tachycardic Pulmonary:     Effort: Pulmonary effort is normal.     Comments: Bilateral expiratory wheeze  Musculoskeletal:        General: Normal range of motion.     Cervical back: Normal range of motion and neck supple.   Skin:    General: Skin is warm and dry.   Neurological:     Mental Status: She is alert and  oriented to person, place, and time.   Psychiatric:        Mood and Affect: Mood normal.        Behavior: Behavior normal.        Thought Content: Thought content normal.        Judgment: Judgment normal.     Diagnostics: Spirometry deferred due to frequent dry cough  Assessment and Plan: 1. Severe persistent asthma with acute exacerbation   2. Chronic rhinitis   3. Recurrent infections     Meds ordered this encounter  Medications   budesonide  (PULMICORT ) 0.5 MG/2ML nebulizer solution    Sig: Take 2 mLs (0.5 mg total) by nebulization 2 (two) times daily.    Dispense:  100 mL    Refill:  5   formoterol  (PERFOROMIST ) 20 MCG/2ML nebulizer solution    Sig: Take 2 mLs (20 mcg total) by nebulization 2 (two) times daily.    Dispense:  100 mL    Refill:  5   budesonide  (RHINOCORT  AQUA) 32 MCG/ACT nasal spray    Sig: Place 1 spray into both nostrils daily as needed.    Dispense:  8.43 mL    Refill:  5   predniSONE  (DELTASONE ) 10 MG tablet    Sig: Begin prednisone  10 mg tablets. Take 2 tablets twice a day for 3 days, then take 2 tablets once a day for 1 day, then take 1 tablet on the 5th day, then stop    Dispense:  15 tablet    Refill:  0    Patient Instructions  Asthma Continue albuterol  2 puffs once every 4 hours if needed for cough or wheeze You may use albuterol  2 puffs 5 to 15 minutes before activity to decrease cough or wheeze Begin budesonide  0.5 mg via nebulizer 2-4 times a day Begin Perforomist  by nebulizer twice daily If your symptoms are not improved with the treatment plan as listed above, begin the prednisone  taper  Chronic rhinitis: Thank you yeah sounds like goodness Consider saline nasal rinses as needed for nasal symptoms. Use this before any medicated nasal sprays for best result Begin Rhinocort  1 spray in each nostril once a day if needed for a stuffy nose A lab order has been entered to help us  evaluate your environmental allergens.  Will call you when the  result becomes available  Recurrent infections Lab orders have been placed to help us  evaluate your immune system.  We will call you when the results become available. Call the clinic if this treatment plan is not working well for you.  Follow up in 1 week or sooner if needed.  Return in about 1 week (around 06/19/2024), or if symptoms worsen or fail to improve.    Thank you for the opportunity to care for this  patient.  Please do not hesitate to contact me with questions.  Marinus Sic, FNP [facility] 

## 2024-06-17 ENCOUNTER — Ambulatory Visit: Admitting: Family Medicine

## 2024-06-17 ENCOUNTER — Encounter: Payer: Self-pay | Admitting: Family Medicine

## 2024-06-17 VITALS — BP 130/79 | HR 88 | Resp 16 | Ht 64.0 in | Wt 230.0 lb

## 2024-06-17 DIAGNOSIS — E559 Vitamin D deficiency, unspecified: Secondary | ICD-10-CM

## 2024-06-17 DIAGNOSIS — E038 Other specified hypothyroidism: Secondary | ICD-10-CM

## 2024-06-17 DIAGNOSIS — Z00121 Encounter for routine child health examination with abnormal findings: Secondary | ICD-10-CM | POA: Insufficient documentation

## 2024-06-17 DIAGNOSIS — Z0001 Encounter for general adult medical examination with abnormal findings: Secondary | ICD-10-CM

## 2024-06-17 DIAGNOSIS — E782 Mixed hyperlipidemia: Secondary | ICD-10-CM | POA: Diagnosis not present

## 2024-06-17 DIAGNOSIS — R7303 Prediabetes: Secondary | ICD-10-CM | POA: Diagnosis not present

## 2024-06-17 NOTE — Assessment & Plan Note (Signed)
 A comprehensive physical examination was completed, and necessary labs were ordered. The patient received counseling on exercise and nutrition. BMI was assessed and discussed Advise for heart health, focus on: Eat more fruits and vegetables: Aim for a variety of colors. Choose whole grains: Brown rice, oats, and whole-wheat bread. Limit unhealthy fats: Avoid trans fats; use olive or avocado oil instead. Include lean proteins: Opt for fish, chicken, beans, and legumes. Reduce sodium: Limit processed foods and add less salt. Stay hydrated: Drink plenty of water. Exercise regularly: Aim for at least 30 minutes of moderate exercise, like walking or cycling, 5 days a week.

## 2024-06-17 NOTE — Progress Notes (Deleted)
   Established Patient Office Visit   Subjective  Patient ID: Simren Popson Matchinis Rittenour, female    DOB: July 05, 1996  Age: 28 y.o. MRN: [id number]  Chief Complaint  Patient presents with   Follow-up    She  has a past medical history of Asthma, Eczema, Family history of adverse reaction to anesthesia, Migraines, Narcolepsy, PONV (postoperative nausea and vomiting), Pregnancy (12/27/2019), PTSD (post-traumatic stress disorder), and Seasonal allergies.  HPI  ROS    Objective:     BP 130/79   Pulse 88   Resp 16   Ht 5' 4 (1.626 m)   Wt 230 lb (104.3 kg)   LMP 04/17/2024   SpO2 96%   BMI 39.48 kg/m  {Vitals History (Optional):23777}  Physical Exam   No results found for any visits on 06/17/24.  The ASCVD Risk score (Arnett DK, et al., 2019) failed to calculate for the following reasons:   The 2019 ASCVD risk score is only valid for ages 58 to 78    Assessment & Plan:  There are no diagnoses linked to this encounter.  No follow-ups on file.   Hilario Kidd Wilhelmena Falter, FNP

## 2024-06-17 NOTE — Patient Instructions (Signed)

## 2024-06-17 NOTE — Progress Notes (Signed)
 Complete physical exam  Patient: Amy [name]   DOB: July 09, 1996   28 y.o. Female  MRN: [id number]  Subjective:    Chief Complaint  Patient presents with   Follow-up    Amy [name] is a 28 y.o. female who presents today for a complete physical exam. She reports consuming a general diet. Swimming 4 times per week She generally feels tired recent positive pregnancy test She reports sleeping fair about 6 hours per night. She does have additional problems to discuss today.    Most recent fall risk assessment:    06/17/2024   11:20 AM  Fall Risk   Falls in the past year? 0  Number falls in past yr: 0  Injury with Fall? 0  Follow up Falls evaluation completed     Most recent depression screenings:    06/17/2024   11:20 AM 04/15/2024    2:43 PM  PHQ 2/9 Scores  PHQ - 2 Score 0 0  PHQ- 9 Score  0    Vision:Not within last year  and Dental: No current dental problems and Receives regular dental care  Patient Care Team: Del Wilhelmena Falter, Hilario, FNP as PCP - General (Family Medicine) Chipps, Damien NOVAK, RN as Registered Nurse   Outpatient Medications Prior to Visit  Medication Sig   albuterol  (VENTOLIN  HFA) 108 (90 Base) MCG/ACT inhaler Inhale 2 puffs into the lungs every 4 (four) hours as needed for wheezing or shortness of breath.   budesonide  (PULMICORT ) 0.5 MG/2ML nebulizer solution Take 2 mLs (0.5 mg total) by nebulization 2 (two) times daily.   budesonide  (RHINOCORT  AQUA) 32 MCG/ACT nasal spray Place 1 spray into both nostrils daily as needed.   formoterol  (PERFOROMIST ) 20 MCG/2ML nebulizer solution Take 2 mLs (20 mcg total) by nebulization 2 (two) times daily.   ipratropium-albuterol  (DUONEB) 0.5-2.5 (3) MG/3ML SOLN Take 3 mLs by nebulization every 6 (six) hours.   montelukast  (SINGULAIR ) 10 MG tablet Take 1 tablet (10 mg total) by mouth at bedtime.   cetirizine  (ZYRTEC ) 10 MG tablet Take 1 tablet (10 mg total) by mouth daily.   guaiFENesin   (MUCINEX  PO) Take by mouth.   predniSONE  (DELTASONE ) 10 MG tablet Begin prednisone  10 mg tablets. Take 2 tablets twice a day for 3 days, then take 2 tablets once a day for 1 day, then take 1 tablet on the 5th day, then stop (Patient not taking: Reported on 06/17/2024)   [DISCONTINUED] Doxylamine -Pyridoxine  (DICLEGIS ) 10-10 MG TBEC Take 1 tablet daily at bedtime. If not controlling symptoms Take 2 tablets at bedtime on Day 2. If still not controlling nausea Take 1 tablet in the morning and 2 tablets at bedtime on Day 3.   No facility-administered medications prior to visit.    Review of Systems  Constitutional:  Negative for chills and fever.  Eyes:  Negative for blurred vision.  Respiratory:  Negative for shortness of breath.   Cardiovascular:  Negative for chest pain.  Gastrointestinal:  Negative for abdominal pain.  Genitourinary:  Negative for dysuria.  Musculoskeletal:  Negative for myalgias.  Neurological:  Negative for dizziness and headaches.       Objective:    BP 130/79   Pulse 88   Resp 16   Ht 5' 4 (1.626 m)   Wt 230 lb (104.3 kg)   LMP 04/17/2024   SpO2 96%   BMI 39.48 kg/m  BP Readings from Last 3 Encounters:  06/17/24 130/79  06/12/24 120/70  06/08/24 116/80  Physical Exam Vitals reviewed.  Constitutional:      General: She is not in acute distress.    Appearance: Normal appearance. She is not ill-appearing, toxic-appearing or diaphoretic.  HENT:     Head: Normocephalic.   Eyes:     General:        Right eye: No discharge.        Left eye: No discharge.     Conjunctiva/sclera: Conjunctivae normal.    Cardiovascular:     Rate and Rhythm: Normal rate.     Pulses: Normal pulses.     Heart sounds: Normal heart sounds.  Pulmonary:     Effort: Pulmonary effort is normal. No respiratory distress.     Breath sounds: Wheezing present.  Abdominal:     General: Bowel sounds are normal.     Palpations: Abdomen is soft.     Tenderness: There is no  abdominal tenderness. There is no right CVA tenderness, left CVA tenderness or guarding.   Musculoskeletal:        General: Normal range of motion.     Cervical back: Normal range of motion.   Skin:    General: Skin is warm and dry.     Capillary Refill: Capillary refill takes less than 2 seconds.   Neurological:     Mental Status: She is alert.     Coordination: Coordination normal.     Gait: Gait normal.   Psychiatric:        Mood and Affect: Mood normal.        Behavior: Behavior normal.      No results found for any visits on 06/17/24.    Assessment & Plan:    Routine Health Maintenance and Physical Exam  Immunization History  Administered Date(s) Administered   Rho (D) Immune Globulin  11/15/2020   Tdap 01/31/2023    Health Maintenance  Topic Date Due   Pneumococcal Vaccine 29-87 Years old (1 of 2 - PCV) Never done   Hepatitis B Vaccines (1 of 3 - 19+ 3-dose series) Never done   HPV VACCINES (1 - Risk 3-dose SCDM series) Never done   COVID-19 Vaccine (1 - 2024-25 season) Never done   INFLUENZA VACCINE  07/24/2024   Cervical Cancer Screening (Pap smear)  04/16/2027   DTaP/Tdap/Td (2 - Td or Tdap) 01/31/2033   Hepatitis C Screening  Completed   HIV Screening  Completed   Meningococcal B Vaccine  Aged Out    Discussed health benefits of physical activity, and encouraged her to engage in regular exercise appropriate for her age and condition.  Prediabetes -     Hemoglobin A1c  Vitamin D  deficiency -     VITAMIN D  25 Hydroxy (Vit-D Deficiency, Fractures)  Mixed hyperlipidemia -     CMP14+EGFR -     CBC with Differential/Platelet -     Lipid panel  TSH (thyroid -stimulating hormone deficiency) -     TSH + free T4  Encounter for child physical exam with abnormal findings Assessment & Plan: A comprehensive physical examination was completed, and necessary labs were ordered. The patient received counseling on exercise and nutrition. BMI was assessed and  discussed Advise for heart health, focus on: Eat more fruits and vegetables: Aim for a variety of colors. Choose whole grains: Brown rice, oats, and whole-wheat bread. Limit unhealthy fats: Avoid trans fats; use olive or avocado oil instead. Include lean proteins: Opt for fish, chicken, beans, and legumes. Reduce sodium: Limit processed foods and add less salt. Stay  hydrated: Drink plenty of water. Exercise regularly: Aim for at least 30 minutes of moderate exercise, like walking or cycling, 5 days a week.       Return if symptoms worsen or fail to improve.     Hilario Kidd Wilhelmena Falter, FNP

## 2024-06-18 ENCOUNTER — Other Ambulatory Visit: Payer: Self-pay | Admitting: Obstetrics & Gynecology

## 2024-06-18 DIAGNOSIS — O3680X0 Pregnancy with inconclusive fetal viability, not applicable or unspecified: Secondary | ICD-10-CM

## 2024-06-18 LAB — ALLERGENS, ZONE 2

## 2024-06-18 LAB — DIPHTHERIA / TETANUS ANTIBODY PANEL

## 2024-06-18 LAB — STREP PNEUMONIAE 23 SEROTYPES IGG

## 2024-06-19 ENCOUNTER — Encounter: Payer: Self-pay | Admitting: Family Medicine

## 2024-06-19 ENCOUNTER — Ambulatory Visit (INDEPENDENT_AMBULATORY_CARE_PROVIDER_SITE_OTHER)

## 2024-06-19 ENCOUNTER — Ambulatory Visit: Admitting: Allergy & Immunology

## 2024-06-19 DIAGNOSIS — Z3A09 9 weeks gestation of pregnancy: Secondary | ICD-10-CM | POA: Diagnosis not present

## 2024-06-19 DIAGNOSIS — O3680X0 Pregnancy with inconclusive fetal viability, not applicable or unspecified: Secondary | ICD-10-CM

## 2024-06-19 NOTE — Progress Notes (Signed)
 US  7+4 wks,single IUP with yolk sac,CRL 12.78 mm,normal left ovary,simple right ovarian cyst 6.7 x 5.9 x 6.3 cm

## 2024-06-23 HISTORY — PX: OTHER SURGICAL HISTORY: SHX169

## 2024-06-24 ENCOUNTER — Ambulatory Visit: Payer: Self-pay | Admitting: Family Medicine

## 2024-06-24 LAB — ALLERGENS, ZONE 2
Amer Sycamore IgE Qn: 0.15 kU/L — AB
Bermuda Grass IgE: 1.77 kU/L — AB
Cat Dander IgE: >100 kU/L — AB
Cedar, Mountain IgE: 0.11 kU/L — AB
Cockroach, American IgE: 0.29 kU/L — AB
Common Silver Birch IgE: <0.1 kU/L
D Pteronyssinus IgE: 2.4 kU/L — AB
Elm, American IgE: 0.13 kU/L — AB
Johnson Grass IgE: 3.15 kU/L — AB
Maple/Box Elder IgE: 0.25 kU/L — AB
Mucor Racemosus IgE: <0.1 kU/L
Nettle IgE: 0.19 kU/L — AB
Oak, White IgE: <0.1 kU/L
Pigweed, Rough IgE: <0.1 kU/L
Plantain, English IgE: <0.1 kU/L
Sheep Sorrel IgE Qn: <0.1 kU/L
Stemphylium Herbarum IgE: <0.1 kU/L
Sweet gum IgE RAST Ql: <0.1 kU/L
Timothy Grass IgE: 8.19 kU/L — AB
White Mulberry IgE: <0.1 kU/L

## 2024-06-24 LAB — STREP PNEUMONIAE 23 SEROTYPES IGG
Pneumo Ab Type 1*: <0.1 ug/mL — AB (ref >1.3)
Pneumo Ab Type 14*: <0.1 ug/mL — AB (ref >1.3)
Pneumo Ab Type 17 (17F)*: 2.6 ug/mL (ref >1.3)
Pneumo Ab Type 2*: 0.7 ug/mL — AB (ref >1.3)
Pneumo Ab Type 20*: 0.2 ug/mL — AB (ref >1.3)
Pneumo Ab Type 22 (22F)*: <0.1 ug/mL — AB (ref >1.3)
Pneumo Ab Type 23 (23F)*: 0.2 ug/mL — AB (ref >1.3)
Pneumo Ab Type 26 (6B)*: 0.4 ug/mL — AB (ref >1.3)
Pneumo Ab Type 3*: <0.1 ug/mL — AB (ref >1.3)
Pneumo Ab Type 34 (10A)*: 0.8 ug/mL — AB (ref >1.3)
Pneumo Ab Type 4*: <0.1 ug/mL — AB (ref >1.3)
Pneumo Ab Type 43 (11A)*: <0.1 ug/mL — AB (ref >1.3)
Pneumo Ab Type 5*: <0.1 ug/mL — AB (ref >1.3)
Pneumo Ab Type 51 (7F)*: 1.7 ug/mL (ref >1.3)
Pneumo Ab Type 70 (33F)*: 0.3 ug/mL — AB (ref >1.3)

## 2024-06-24 LAB — IGG, IGA, IGM
IgA/Immunoglobulin A, Serum: 101 mg/dL (ref 87–352)
IgG (Immunoglobin G), Serum: 901 mg/dL (ref 586–1602)
IgM (Immunoglobulin M), Srm: 132 mg/dL (ref 26–217)

## 2024-06-24 LAB — DIPHTHERIA / TETANUS ANTIBODY PANEL: Diphtheria Ab: 0.51 [IU]/mL (ref <0.10)

## 2024-06-24 NOTE — Progress Notes (Signed)
 Can you please let this patient know that her environmental allergy testing was really elevated to dog and cat. 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 Moderately elevated to dust mites, tree pollen and slightly elevated to ragweed pollen, and weed pollen. Please send out avoidance measures.   Also please let her know that her immunoglobulin levels were normal.  Immunoglobulins are proteins that find to neutralize bacteria and viruses.  We also looked at specific immunoglobulins to routine vaccinations.  Your testing indicated protection against diphtheria and tetanus, however, indicated only 3 out of 23 protective pneumococcal serotypes.  This is a bacteria that causes sinus infections, ear infections, and pneumonia. We also looked at complement activity.  Complement as a protein made by your liver which helps your immune system to work more efficiently.  This activity was normal.   Would suggest a PPSV23 vaccine to boost pneumococcal titers at this time.  You can get this vaccine from your primary care provider or the health department.  Let us  know when you get the vaccine and we will recheck the titer 4 to 6 weeks after you get the vaccine.

## 2024-06-29 NOTE — Telephone Encounter (Signed)
 Called and spoke to patient and she expressed that she was driving. I informed her that I would call her back and mail out her lab results to her. Patient verbalized understanding.

## 2024-07-07 ENCOUNTER — Other Ambulatory Visit: Payer: Self-pay

## 2024-07-07 ENCOUNTER — Ambulatory Visit

## 2024-07-07 ENCOUNTER — Encounter (HOSPITAL_COMMUNITY): Payer: Self-pay

## 2024-07-07 ENCOUNTER — Emergency Department (HOSPITAL_COMMUNITY)
Admission: EM | Admit: 2024-07-07 | Discharge: 2024-07-08 | Disposition: A | Discharge status: Home / Self Care | Attending: Emergency Medicine | Admitting: Emergency Medicine

## 2024-07-07 DIAGNOSIS — N83201 Unspecified ovarian cyst, right side: Secondary | ICD-10-CM

## 2024-07-07 DIAGNOSIS — D72829 Elevated white blood cell count, unspecified: Secondary | ICD-10-CM | POA: Diagnosis not present

## 2024-07-07 DIAGNOSIS — J45909 Unspecified asthma, uncomplicated: Secondary | ICD-10-CM | POA: Insufficient documentation

## 2024-07-07 DIAGNOSIS — Z3A10 10 weeks gestation of pregnancy: Secondary | ICD-10-CM | POA: Insufficient documentation

## 2024-07-07 DIAGNOSIS — R1031 Right lower quadrant pain: Secondary | ICD-10-CM | POA: Insufficient documentation

## 2024-07-07 DIAGNOSIS — O26891 Other specified pregnancy related conditions, first trimester: Secondary | ICD-10-CM | POA: Diagnosis present

## 2024-07-07 DIAGNOSIS — Z7951 Long term (current) use of inhaled steroids: Secondary | ICD-10-CM | POA: Insufficient documentation

## 2024-07-07 LAB — CBC WITH DIFFERENTIAL/PLATELET
Abs Immature Granulocytes: 0.08 K/uL — ABNORMAL HIGH (ref 0.00–0.07)
Basophils Absolute: 0.1 K/uL (ref 0.0–0.1)
Basophils Relative: 1 %
Eosinophils Absolute: 0.5 K/uL (ref 0.0–0.5)
Eosinophils Relative: 3 %
HCT: 42.4 % (ref 36.0–46.0)
Hemoglobin: 14.4 g/dL (ref 12.0–15.0)
Immature Granulocytes: 1 %
Lymphocytes Relative: 16 %
Lymphs Abs: 2.3 K/uL (ref 0.7–4.0)
MCH: 28.6 pg (ref 26.0–34.0)
MCHC: 34 g/dL (ref 30.0–36.0)
MCV: 84.1 fL (ref 80.0–100.0)
Monocytes Absolute: 0.7 K/uL (ref 0.1–1.0)
Monocytes Relative: 5 %
Neutro Abs: 10.9 K/uL — ABNORMAL HIGH (ref 1.7–7.7)
Neutrophils Relative %: 74 %
Platelets: 173 K/uL (ref 150–400)
RBC: 5.04 MIL/uL (ref 3.87–5.11)
RDW: 13.2 % (ref 11.5–15.5)
WBC: 14.4 K/uL — ABNORMAL HIGH (ref 4.0–10.5)
nRBC: 0 % (ref 0.0–0.2)

## 2024-07-07 MED ORDER — ONDANSETRON HCL 4 MG/2ML IJ SOLN
4.0000 mg | Freq: Once | INTRAMUSCULAR | Status: AC
  Administered 2024-07-07: 4 mg via INTRAVENOUS
  Filled 2024-07-07: qty 2

## 2024-07-07 MED ORDER — MORPHINE SULFATE (PF) 4 MG/ML IV SOLN
4.0000 mg | Freq: Once | INTRAVENOUS | Status: AC
  Administered 2024-07-07: 4 mg via INTRAVENOUS
  Filled 2024-07-07: qty 1

## 2024-07-07 NOTE — ED Provider Notes (Signed)
 [place] [facility]MENT AT [facility] Provider Note   CSN: [id number] Arrival date & time: 07/07/24  2052     Patient presents with: Groin Pain   Amy [name] is a 28 y.o. female.  {Add pertinent medical, surgical, social history, OB history to HPI:32947} HPI     This is a 28 year old female currently 10 weeks 1 day pregnant who presents with right lower quadrant and groin pain.  Reports acute onset of pain earlier this morning.  Thought that maybe she stepped wrong and pulled a muscle in her groin.  Pain has been constant.  It does seem worse with movement but is not improved with rest.  She took some Tylenol  with minimal relief.  Has not had any vaginal bleeding or vaginal discharge.  Chart reviewed.  First trimester ultrasound showed an IUP.  However she also had a 7 cm right ovarian cyst.  Prior to Admission medications   Medication Sig Start Date End Date Taking? Authorizing Provider  albuterol  (VENTOLIN  HFA) 108 (90 Base) MCG/ACT inhaler Inhale 2 puffs into the lungs every 4 (four) hours as needed for wheezing or shortness of breath. 06/08/24   Theadore Ozell HERO, MD  budesonide  (PULMICORT ) 0.5 MG/2ML nebulizer solution Take 2 mLs (0.5 mg total) by nebulization 2 (two) times daily. 06/12/24   Cari Arlean HERO, FNP  budesonide  (RHINOCORT  AQUA) 32 MCG/ACT nasal spray Place 1 spray into both nostrils daily as needed. 06/12/24   Cari Arlean HERO, FNP  cetirizine  (ZYRTEC ) 10 MG tablet Take 1 tablet (10 mg total) by mouth daily. 11/19/21   Arloa Suzen RAMAN, NP  formoterol  (PERFOROMIST ) 20 MCG/2ML nebulizer solution Take 2 mLs (20 mcg total) by nebulization 2 (two) times daily. 06/12/24   Cari Arlean HERO, FNP  guaiFENesin  (MUCINEX  PO) Take by mouth.    [provider]  ipratropium-albuterol  (DUONEB) 0.5-2.5 (3) MG/3ML SOLN Take 3 mLs by nebulization every 6 (six) hours. 03/27/24   Del Orbe Polanco, Iliana, FNP  montelukast  (SINGULAIR ) 10 MG tablet Take 1 tablet  (10 mg total) by mouth at bedtime. 03/27/24   Del Orbe Polanco, Iliana, FNP  predniSONE  (DELTASONE ) 10 MG tablet Begin prednisone  10 mg tablets. Take 2 tablets twice a day for 3 days, then take 2 tablets once a day for 1 day, then take 1 tablet on the 5th day, then stop Patient not taking: Reported on 06/17/2024 06/12/24   Cari Arlean HERO, FNP    Allergies: Benzyl alcohol, Cough syrup [guaifenesin ], Haemophilus influenzae vaccines, Other, Petrolatum, and Iodine    Review of Systems  Constitutional:  Negative for fever.  Respiratory:  Negative for shortness of breath.   Cardiovascular:  Negative for chest pain.  Gastrointestinal:  Positive for abdominal pain. Negative for nausea and vomiting.  Genitourinary:  Negative for vaginal bleeding, vaginal discharge and vaginal pain.  All other systems reviewed and are negative.   Updated Vital Signs BP (!) 124/55 (BP Location: Right Arm)   Pulse 88   Temp 97.9 F (36.6 C) (Oral)   Resp 20   LMP 04/17/2024   SpO2 98%   Physical Exam Vitals and nursing note reviewed.  Constitutional:      Appearance: She is well-developed. She is obese. She is not ill-appearing.  HENT:     Head: Normocephalic and atraumatic.  Eyes:     Pupils: Pupils are equal, round, and reactive to light.  Cardiovascular:     Rate and Rhythm: Normal rate and regular rhythm.  Heart sounds: Normal heart sounds.  Pulmonary:     Effort: Pulmonary effort is normal. No respiratory distress.     Breath sounds: No wheezing.  Abdominal:     General: Bowel sounds are normal.     Palpations: Abdomen is soft.     Tenderness: There is abdominal tenderness. There is guarding. There is no rebound.     Comments: Tenderness palpation right lower quadrant with voluntary guarding  Musculoskeletal:     Cervical back: Neck supple.  Skin:    General: Skin is warm and dry.  Neurological:     Mental Status: She is alert and oriented to person, place, and time.  Psychiatric:        Mood  and Affect: Mood normal.     (all labs ordered are listed, but only abnormal results are displayed) Labs Reviewed  CBC WITH DIFFERENTIAL/PLATELET  COMPREHENSIVE METABOLIC PANEL WITH GFR    EKG: None  Radiology: No results found.  {Document cardiac monitor, telemetry assessment procedure when appropriate:32947} Procedures   Medications Ordered in the ED  morphine  (PF) 4 MG/ML injection 4 mg (has no administration in time range)  ondansetron  (ZOFRAN ) injection 4 mg (has no administration in time range)      {Click here for ABCD2, HEART and other calculators REFRESH Note before signing:1}                              Medical Decision Making Amount and/or Complexity of Data Reviewed Labs: ordered. Radiology: ordered.  Risk Prescription drug management.   ***  {Document critical care time when appropriate  Document review of labs and clinical decision tools ie CHADS2VASC2, etc  Document your independent review of radiology images and any outside records  Document your discussion with family members, caretakers and with consultants  Document social determinants of health affecting pt's care  Document your decision making why or why not admission, treatments were needed:32947:::1}   Final diagnoses:  None    ED Discharge Orders     None

## 2024-07-07 NOTE — ED Triage Notes (Signed)
 Pt POV c/o right groin pain that started after waking up this morning. Pt states that she is having pain down her right leg and that it feels cold. Pt is currently [redacted] weeks pregnant.

## 2024-07-07 NOTE — ED Notes (Signed)
 Pt ambulatory to restroom

## 2024-07-08 ENCOUNTER — Emergency Department (HOSPITAL_COMMUNITY)

## 2024-07-08 DIAGNOSIS — Z3A10 10 weeks gestation of pregnancy: Secondary | ICD-10-CM | POA: Diagnosis not present

## 2024-07-08 DIAGNOSIS — J45909 Unspecified asthma, uncomplicated: Secondary | ICD-10-CM | POA: Diagnosis not present

## 2024-07-08 DIAGNOSIS — D72829 Elevated white blood cell count, unspecified: Secondary | ICD-10-CM | POA: Diagnosis not present

## 2024-07-08 DIAGNOSIS — O26891 Other specified pregnancy related conditions, first trimester: Secondary | ICD-10-CM | POA: Diagnosis present

## 2024-07-08 DIAGNOSIS — Z7951 Long term (current) use of inhaled steroids: Secondary | ICD-10-CM | POA: Diagnosis not present

## 2024-07-08 DIAGNOSIS — R1031 Right lower quadrant pain: Secondary | ICD-10-CM | POA: Diagnosis not present

## 2024-07-08 LAB — URINALYSIS, ROUTINE W REFLEX MICROSCOPIC
Bacteria, UA: NONE SEEN
Bilirubin Urine: NEGATIVE
Glucose, UA: NEGATIVE mg/dL
Hgb urine dipstick: NEGATIVE
Ketones, ur: NEGATIVE mg/dL
Nitrite: NEGATIVE
Protein, ur: 30 mg/dL — AB
Specific Gravity, Urine: 1.034 — ABNORMAL HIGH (ref 1.005–1.030)
pH: 5 (ref 5.0–8.0)

## 2024-07-08 LAB — COMPREHENSIVE METABOLIC PANEL WITH GFR
ALT: 43 U/L (ref 0–44)
AST: 27 U/L (ref 15–41)
Albumin: 3.6 g/dL (ref 3.5–5.0)
Alkaline Phosphatase: 98 U/L (ref 38–126)
Anion gap: 13 (ref 5–15)
BUN: 13 mg/dL (ref 6–20)
CO2: 21 mmol/L — ABNORMAL LOW (ref 22–32)
Calcium: 8.7 mg/dL — ABNORMAL LOW (ref 8.9–10.3)
Chloride: 103 mmol/L (ref 98–111)
Creatinine, Ser: 0.66 mg/dL (ref 0.44–1.00)
GFR, Estimated: >60 mL/min (ref >60)
Glucose, Bld: 96 mg/dL (ref 70–99)
Potassium: 3.7 mmol/L (ref 3.5–5.1)
Sodium: 137 mmol/L (ref 135–145)
Total Bilirubin: 0.5 mg/dL (ref 0.0–1.2)
Total Protein: 6.8 g/dL (ref 6.5–8.1)

## 2024-07-08 MED ORDER — LIDOCAINE 5 % EX PTCH: 1.0000 | MEDICATED_PATCH | CUTANEOUS | 0 refills | Status: DC

## 2024-07-08 NOTE — ED Provider Notes (Signed)
 MRI is negative.  Will have patient follow up with their PMD and OB   Lazarus Sudbury, MD 07/08/24 [id number]

## 2024-07-08 NOTE — ED Provider Notes (Signed)
 Transferred for MRI  NCAT PERRL RRR CTAB NABS  MRI pending   Kendall Justo, MD 07/08/24 9650

## 2024-07-08 NOTE — ED Notes (Signed)
 Patient stated that her mid right thigh is feeling hard and she is nausea

## 2024-07-08 NOTE — ED Notes (Signed)
 Patient transported to MRI

## 2024-07-08 NOTE — ED Notes (Signed)
 Patient has no right pedal pulse at this time.

## 2024-07-08 NOTE — ED Notes (Signed)
 Patient Advocate observed patient lying in bed with eyes closed.    Amy [name] 07/08/24 2259

## 2024-07-08 NOTE — ED Triage Notes (Signed)
 Patient stated that she woke up this morning with right leg swelling, right leg discoloration, and right leg pain.

## 2024-07-09 ENCOUNTER — Ambulatory Visit: Admitting: Adult Health

## 2024-07-09 ENCOUNTER — Ambulatory Visit

## 2024-07-09 ENCOUNTER — Other Ambulatory Visit (HOSPITAL_COMMUNITY): Payer: Self-pay | Admitting: Emergency Medicine

## 2024-07-09 DIAGNOSIS — R1031 Right lower quadrant pain: Secondary | ICD-10-CM

## 2024-07-09 NOTE — Progress Notes (Signed)
    Division of Pharmacy Services  Patient Name: Amy [name]  Age: 28 y.o.   [facility] [address]  Allergies: Benzyl alcohol, Guaifenesin , Haemophilus influenzae vaccines, Petrolatum, Iodinated contrast media, and Iodine  Admission History Admission Medication List was Completed  See below.   Medication Documentation Review Audit     Reviewed by Geraldene Rea, CPhT (Pharmacy Technician) on 07/09/24 at 0119  Med List Status: Pharm Tech complete         Reviewed by Geraldene Rea, CPhT (Pharmacy Technician) on 07/09/24 at 0119  Med List Status: Pharm Tech complete         Reviewed by Rosina Almarie Charon, RN (Registered Nurse) on 07/08/24 at 1859  Med List Status: <None>           Audit from Redirected Encounters   **Prior to Admission medications have not yet been reviewed for this encounter**    Prior to Admission Medications     Reviewed by Geraldene Rea, CPhT on 07/09/24 at 0119    Medication Sig Last Dose Informant Taking? Status  albuterol  HFA (PROVENTIL  HFA;VENTOLIN  HFA;PROAIR  HFA) 90 mcg/actuation inhaler Inhale 2 puffs every 4 (four) hours as needed for wheezing or shortness of breath. Past Month Self, Other Yes Active  budesonide  (PULMICORT ) 0.5 mg/2 mL nebulizer solution Inhale 0.5 mg daily as needed. Past Month Self, Other Yes Active  Diclegis  10-10 mg TbEC per DR tablet TAKE 1 TAB AT BEDTIME, MAY TAKE 2 TABS AT BEDTIME ON DAY 2 IF NEED,MAY TAKE 1 TAB IN A.M. & 2 TABS AT BED ON DAY 3 AND BEYOND  Patient not taking: Reported on 07/09/2024   Not Taking Self, Other No Active  fluconazole  (DIFLUCAN ) 150 mg tablet Take 1 tablet (150 mg total) by mouth once for 1 dose. Take second tablet 72 hours later if no improvement in symptoms.  Patient not taking: Reported on 07/09/2024   Unknown Self, Other No Active  ipratropium-albuteroL  (DUO-NEB) 0.5-2.5 mg/3 mL nebulizer solution Inhale 3 mL every 6 (six) hours. More than a month Self, Other No Active   lidocaine  (Lidoderm ) 5 % patch Apply 1 patch topically daily. 07/08/2024 Self Yes Active  montelukast  (SINGULAIR ) 10 mg tablet Take 10 mg by mouth nightly.  Patient not taking: Reported on 07/09/2024   Not Taking Self, Other No Active  NON FORMULARY Take 2 each by mouth daily. Past Month Self Yes Active  nystatin  (MYCOSTATIN ) 100,000 unit/gram ointment apply to the affected area(s) twice daily  Patient not taking: Reported on 07/09/2024   Unknown Self, Other No Active           Audit from Redirected Encounters   **Prior to Admission medications have not yet been reviewed for this encounter**     Patients' primary pharmacy/pharmacies [facility] level in the medication list: Confident  All medications and allergies were verified by patient who is a good historian and last fills/doses were verified by Dr. Annemarie. Patient indicates majority of medications have been stopped until approval from OBGYN due to pregnancy.   Removed: Fluconazole  - Therapy complete  Nystatin  - Non compliance  DPS enrolled for delivery. Please call [phone] with questions for patients bedded in [facility] and [phone] for all other locations.  Electronically signed by: Geraldene Rea, CPhT 07/09/2024 1:18 AM  Electronically signed by: Brionna Tatum, CPhT 07/09/2024 1:19 AM

## 2024-07-13 DIAGNOSIS — Z3480 Encounter for supervision of other normal pregnancy, unspecified trimester: Secondary | ICD-10-CM | POA: Insufficient documentation

## 2024-07-16 ENCOUNTER — Encounter: Payer: Self-pay | Admitting: Advanced Practice Midwife

## 2024-07-16 ENCOUNTER — Other Ambulatory Visit (HOSPITAL_COMMUNITY)
Admission: RE | Admit: 2024-07-16 | Discharge: 2024-07-16 | Disposition: A | Discharge status: Home / Self Care | Source: Ambulatory Visit | Attending: Advanced Practice Midwife | Admitting: Advanced Practice Midwife

## 2024-07-16 ENCOUNTER — Encounter: Admitting: *Deleted

## 2024-07-16 ENCOUNTER — Ambulatory Visit (INDEPENDENT_AMBULATORY_CARE_PROVIDER_SITE_OTHER): Admitting: Advanced Practice Midwife

## 2024-07-16 VITALS — BP 118/74 | HR 99 | Wt 215.0 lb

## 2024-07-16 DIAGNOSIS — Z131 Encounter for screening for diabetes mellitus: Secondary | ICD-10-CM

## 2024-07-16 DIAGNOSIS — O2230 Deep phlebothrombosis in pregnancy, unspecified trimester: Secondary | ICD-10-CM | POA: Insufficient documentation

## 2024-07-16 DIAGNOSIS — Z6839 Body mass index (BMI) 39.0-39.9, adult: Secondary | ICD-10-CM | POA: Diagnosis not present

## 2024-07-16 DIAGNOSIS — Z3481 Encounter for supervision of other normal pregnancy, first trimester: Secondary | ICD-10-CM

## 2024-07-16 DIAGNOSIS — J4520 Mild intermittent asthma, uncomplicated: Secondary | ICD-10-CM

## 2024-07-16 DIAGNOSIS — Z3A11 11 weeks gestation of pregnancy: Secondary | ICD-10-CM | POA: Insufficient documentation

## 2024-07-16 DIAGNOSIS — Z3480 Encounter for supervision of other normal pregnancy, unspecified trimester: Secondary | ICD-10-CM

## 2024-07-16 DIAGNOSIS — Z363 Encounter for antenatal screening for malformations: Secondary | ICD-10-CM

## 2024-07-16 MED ORDER — DICLEGIS 10-10 MG PO TBEC: DELAYED_RELEASE_TABLET | ORAL | 6 refills | Status: AC

## 2024-07-16 MED ORDER — MONTELUKAST SODIUM 10 MG PO TABS: 10.0000 mg | ORAL_TABLET | Freq: Every day | ORAL | 3 refills | Status: AC

## 2024-07-16 MED ORDER — ONDANSETRON 4 MG PO TBDP: 4.0000 mg | ORAL_TABLET | Freq: Four times a day (QID) | ORAL | 2 refills | Status: DC | PRN

## 2024-07-16 NOTE — Patient Instructions (Addendum)
 [facility] offers online women's holistic mental health counseling and therapy provided by licensed mental health counselors and therapists.   You can refer yourself using the link below: (if it isn't clickable from your mychart account, copy and paste it in a new browser).  If you have ANY problems, please let me know and I will help troubleshoot.   [email or web address]  OR  [email or web address]    Safe Medications in Pregnancy   Acne: Benzoyl Peroxide Salicylic Acid  Backache/Headache: Tylenol : 2 regular strength every 4 hours OR              2 Extra strength every 6 hours  Colds/Coughs/Allergies: Benadryl  (alcohol free) 25 mg every 6 hours as needed Breath right strips Claritin  Cepacol throat lozenges Chloraseptic throat spray Cold-Eeze- up to three times per day Cough drops, alcohol free Flonase  (by prescription only) Guaifenesin  Mucinex  Robitussin DM (plain only, alcohol free) Saline nasal spray/drops Sudafed (pseudoephedrine) & Actifed ** use only after [redacted] weeks gestation and if you do not have high blood pressure Tylenol  Vicks Vaporub Zinc lozenges Zyrtec    Constipation: Colace Ducolax suppositories Fleet enema Glycerin  suppositories Metamucil Milk of magnesia Miralax Senokot Smooth move tea  Diarrhea: Kaopectate Imodium A-D  *NO pepto Bismol  Hemorrhoids: Anusol Anusol HC Preparation H Tucks  Indigestion: Tums Maalox Mylanta Zantac  Pepcid  Insomnia: Benadryl  (alcohol free) 25mg  every 6 hours as needed Tylenol  PM Unisom , no Gelcaps  Leg Cramps: Tums MagGel  Nausea/Vomiting:  Bonine Dramamine Emetrol Ginger extract Sea bands Meclizine  Nausea medication to take during pregnancy:  Unisom  (doxylamine  succinate 25 mg tablets) Take one tablet daily at bedtime. If symptoms are not adequately controlled, the dose can be increased to a maximum recommended dose  of two tablets daily (1/2 tablet in the morning, 1/2 tablet mid-afternoon and one at bedtime). Vitamin B6 100mg  tablets. Take one tablet twice a day (up to 200 mg per day).  Skin Rashes: Aveeno products Benadryl  cream or 25mg  every 6 hours as needed Calamine Lotion 1% cortisone cream  Yeast infection: Gyne-lotrimin 7 Monistat 7   **If taking multiple medications, please check labels to avoid duplicating the same active ingredients **take medication as directed on the label ** Do not exceed 4000 mg of tylenol  in 24 hours **Do not take medications that contain aspirin or ibuprofen    ([phone] is the phone number for Pregnancy Classes or hospital tours at [facility].   You will be referred to  [email or web address]   for more information on childbirth classes   At this site you may register for classes. You may sign up for a waiting list if classes are full. Please SIGN UP FOR THIS!.   When the waiting list becomes long, sometimes new classes can be added.  [facility] Call to Register: [phone] or [phone]   or   Register Online: [email or web address] THESE CLASSES FILL UP VERY QUICKLY, SO SIGN UP AS SOON AS YOU CAN!!! Please visit [facility] pregnancy website at [email or web address]  Childbirth Classes  Option 1: Birth & Baby Series Series of 3 weekly classes, on the same day of the week (can choose Mon-Thurs) from 6-9pm Helps you and your support person prepare for childbirth Reviews newborn care, labor & birth, cesarean birth, pain management, and comfort techniques Cost: $60 per couple for insured or self-pay, $30 per couple for Medicaid  Option 2: Weekend Birth & Baby This class is a weekend version of our Birth & Baby series.  It is designed  for parents who have a difficult time fitting several weeks of classes into their schedule.    Covers the care of your newborn and the basics of labor and childbirth Friday 6:30pm-8:30pm Saturday 9am-4pm, includes lunch for you and your partner  Cost: $75 per couple for insured or self-pay, $30 per couple for Medicaid  Option 3: Natural Childbirth This series of 5 weekly classes is for expectant parents who want to learn and practice natural methods of coping with the process of labor and childbirth.  Can choose Mon or Tues, 7-9pm.   Covers relaxation, breathing, massage, visualization, role of the partner, and helpful positioning Participants learn how to be confident in their body's ability to give birth. Class empowers and helps parents make informed decisions about care. Includes discussion that will help new parents transition into the immediate postpartum period.  Cost: $75 per couple for insured or self-pay, $30 per couple for Medicaid  Option 4: Online Birth & Baby This online class offers you the freedom to complete a Birth & Baby series in the comfort of your own home.  The flexibility of this option allows you to review sections at your own pace, at times convenient to you and your support people.  It includes additional video information, animations, quizzes and extended activities. Get organized with helpful eClass tools, checklists, and trackers.  Cost: $60 for 60 days of online access                                                                            Other Available Classes  Baby & Me Enjoy this time to discuss newborn & infant parenting topics and family adjustment issues with other new mothers in a relaxed environment. Each week brings a new speaker or baby-centered activity. We encourage mothers and their babies (birth to crawling) to join us . You are welcome to visit this group even if you haven't delivered yet! It's wonderful to make new friends early and watch other moms interact with their babies. No registration or fee.  Big Brother/Big Sister Let your  children share in the joy of a new brother or sister in this special class designed just for them. Discussion includes how families care for babies: swaddling, holding, diapering, safety, as well as how they can be helpful in their new role. This class is designed for children ages 2 to 22, but any age is welcome. Please register each child individually. $5 Breastfeeding Support Group This group is a mother-to-mother support circle where moms have the opportunity to share their breastfeeding experiences. A Breastfeeding Support nurse is present for questions and concerns. An infant scale is available for weight checks. No fee or registration.  Breastfeeding Your Baby Breastfeeding is a special time for mother and child. This class will help you feel ready to begin this important relationship. Your partner is encouraged to attend with you. Learn what to expect and feel more confident in the first days of breastfeeding your newborn. This class also addresses the most common fears and challenges of breastfeeding during the first few weeks, months, and beyond. $30 per couple Caring for Baby This class is for expectant and adoptive parents who want to learn and practice the most  up-to-date newborn care for their babies. Focus is on birth through first six weeks of life. Topics include feeding, bathing, diapering, crying, umbilical cord care, circumcision care and safe sleep. Parents learn how to recognize symptoms of illness and when to call the pediatrician. Register only the mom-to-be and your partner can plan to come with you. (*Note: This class is included in the Birth & Baby series and the Weekend Birth & Baby classes.) $10 per couple Comfort Techniques & Tour This 2-hour interactive class is designed for those who either do not wish to take the Birth & Baby series or for those who prefer our online childbirth class, but don't want to miss the opportunity to learn and practice hands-on techniques. These  skills can help relieve some of the discomfort of labor and encourage your baby to rotate toward the best position for birth. You and your partner will be able to try a variety of labor positions with birth balls and rebozos as well as practice breathing, relaxation, and visual techniques. $20 per couple [employer] This course offers Dads-to-be the tools and knowledge needed to feel confident on their journey to becoming new fathers. Experienced dads, who have been trained as coaches, teach dads-to-be how to hold, comfort, diapers, swaddle and play with their infant while being able to support the new mom as well. $25 Grandparent Love Expecting a grandbaby? Learn about the latest infant care and safety recommendations and ways to support your own child as he or she transitions into the parenting role. $10 per person Infant and Child CPR Parents, grandparents, babysitters, and friends learn Cardio-Pulmonary Resuscitation skills for infants and children. You will also learn how to treat both conscious and unconscious choking infants and children. Register each participant individually. (Note: This Family & Friends program does not offer certification.) $20 per person Marvelous Multiples Expecting twins, triplets, or more? This free 2-hour class covers the differences in labor, birth, parenting, and breastfeeding issues that face multiples' parents.  Maternity Care Center Virtual Tour  Online virtual tour of the new [place] [facility] at [facility] Talk This free mom-led group offers support and connection to mothers as they journey through the adjustments and struggles of that sometimes overwhelming first year after the birth of a child. A member of our staff will be present to share resources and additional support if needed, as you care for yourself and baby. You are welcome to visit this group before you deliver! It's wonderful to meet new friends early and watch other  moms interact with their babies.  Waterbirth Class Interested in a waterbirth? This free informational class will help you discover whether waterbirth is the right fit for you and is required if you are planning a waterbirth. Education about waterbirth itself, supplies you may need, and what you may need from your support team is included in this class. Partners are encouraged to come.

## 2024-07-16 NOTE — Progress Notes (Signed)
 INITIAL OBSTETRICAL VISIT Patient name: Amy [name] MRN [id number]  Date of birth: [birth year] Chief Complaint:   Initial Prenatal Visit (Lovenox  injection makes her sick/)  History of Present Illness:   Amy [name] is a 28 y.o. G86P1001  female at [redacted]w[redacted]d by US  at 7 weeks with an Estimated Date of Delivery: 02/01/25 being seen today for her initial obstetrical visit.   Her obstetrical history is significant for Term SVD in 2022 w/o problems  This pregnancy she has a 6cm right ovarian cyst; .  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    and underwent ieofemoral thrombectomy/open femoral vein cutdown.  On lovenox  100mg  q 12 hr   Today she reports has had nausea since starting the lovenox  in the hospital  Otherwise, doing well postoperatively.  States she has a lot of trouble giving herself injections, taking about 20 minutes each time to talk herself into giving herself the injection.  Her mom has carpel tunnel and has decreased sensation in her hands, so doesn't feel comfortable giving her the injections.   Has anxiety and PTSD;  offered a therapy referral and declined,  She has a service cat who provides her comfort.   Has asthma, taken off singulair  by PCP w/+ UPT.  Feels like she needs to be back on it, as is using inhaler but feels constantly wheezy.   .     07/16/2024   11:40 AM 06/17/2024   11:20 AM 04/15/2024    2:43 PM 03/27/2024   10:59 AM 10/23/2023    1:55 PM  Depression screen PHQ 2/9  Decreased Interest 1 0 0 2 0  Down, Depressed, Hopeless 0 0 0 0 0  PHQ - 2 Score 1 0 0 2 0  Altered sleeping 1  0 3   Tired, decreased energy 1  0 3   Change in appetite 1  0 3   Feeling bad or failure about yourself  0  0 0   Trouble concentrating 0  0 2   Moving slowly or fidgety/restless 0  0 0   Suicidal thoughts 0  0 0   PHQ-9  Score 4  0 13   Difficult doing work/chores    Very difficult     Patient's last menstrual period was 04/17/2024. Last pap  Diagnosis  Date Value Ref Range Status  04/15/2024   Final   - Negative for intraepithelial lesion or malignancy (NILM)   Review of Systems:   Pertinent items are noted in HPI Denies cramping/contractions, leakage of fluid, vaginal bleeding, abnormal vaginal discharge w/ itching/odor/irritation, headaches, visual changes, shortness of breath, chest pain, abdominal pain, severe nausea/vomiting, or problems with urination or bowel movements unless otherwise stated above.  Pertinent History Reviewed:  Reviewed past medical,surgical, social, obstetrical and family history.  Reviewed problem list, medications and allergies. OB History  Gravida Para Term Preterm AB Living  2 1 1   1   SAB IAB Ectopic Multiple Live Births      1    # Outcome Date GA Lbr Len/2nd Weight Sex Type Anes PTL Lv  2 Current           1 Term 01/11/21 [redacted]w[redacted]d  7 lb 14 oz (3.572 kg) M Vag-Spont   LIV   Physical Assessment:   Vitals:   07/16/24 1137  BP: 118/74  Pulse: 99  Weight: 215 lb (97.5 kg)  Body mass index is 36.9 kg/m.       Physical Examination:  General appearance - well appearing, and in no distress  Mental status - alert, oriented to person, place, and time  Psych:  She has a normal mood and affect  Skin - warm and dry, normal color, no suspicious lesions noted  Chest - effort normal  Heart - normal rate and regular rhythm  Abdomen - soft, nontender  Extremities:  No swelling or varicosities noted.  RIght surgical site healing well.    TODAY'S bedside US :  active fetus w/FHR ~ 160  No results found for this or any previous visit (from the past 24 hours).   Indications for ASA therapy (per uptodate) One of the following: Previous pregnancy with preeclampsia, especially early onset and with an adverse outcome No Multifetal gestation No Chronic hypertension No Type 1  or 2 diabetes mellitus No Chronic kidney disease No Autoimmune disease (antiphospholipid syndrome, systemic lupus erythematosus) No  Two or more of the following: Nulliparity No Obesity (body mass index >30 kg/m2) Yes Family history of preeclampsia in mother or sister No Age >=35 years No Sociodemographic characteristics (African American race, low socioeconomic level) No Personal risk factors (eg, previous pregnancy with low birth weight or small for gestational age infant, previous adverse pregnancy outcome [eg, stillbirth], interval >10 years between pregnancies) No       07/16/2024   11:40 AM 06/17/2024   11:20 AM 04/15/2024    2:43 PM 03/27/2024   10:59 AM 10/23/2023    1:55 PM  Depression screen PHQ 2/9  Decreased Interest 1 0 0 2 0  Down, Depressed, Hopeless 0 0 0 0 0  PHQ - 2 Score 1 0 0 2 0  Altered sleeping 1  0 3   Tired, decreased energy 1  0 3   Change in appetite 1  0 3   Feeling bad or failure about yourself  0  0 0   Trouble concentrating 0  0 2   Moving slowly or fidgety/restless 0  0 0   Suicidal thoughts 0  0 0   PHQ-9 Score 4  0 13   Difficult doing work/chores    Very difficult         07/16/2024   11:40 AM 04/15/2024    2:44 PM 03/27/2024   10:59 AM 09/25/2023    2:01 PM  GAD 7 : Generalized Anxiety Score  Nervous, Anxious, on Edge 1 0 2 3  Control/stop worrying 1 0 3 1  Worry too much - different things 1 0 3 3  Trouble relaxing 1 0 3 3  Restless 1 0 2 2  Easily annoyed or irritable 2 0 3 3  Afraid - awful might happen 1 0 0 0  Total GAD 7 Score 8 0 16 15  Anxiety Difficulty   Very difficult Very difficult      Assessment & Plan:  1) High-Risk Pregnancy G2P1001 at [redacted]w[redacted]d with an Estimated Date of Delivery: 02/01/25   2) Initial OB visit    1. Supervision of other high risk pregnancy, antepartum (Primary)  - [facility] AMB BABYSCRIPTS SCHEDULE OPTIMIZATION - CBC/D/Plt+RPR+Rh+ABO+RubIgG... - Hemoglobin A1c - PANORAMA PRENATAL TEST - Urine  Culture - Cervicovaginal ancillary only  2. [redacted] weeks gestation of pregnancy    3. Diabetes mellitus screening  - Hemoglobin A1c  4. BMI 39.0-39.9,adult  EFW 32 weeks  5. Mild intermittent asthma without complication  - montelukast  (  SINGULAIR ) 10 MG tablet; Take 1 tablet (10 mg total) by mouth at bedtime.  Dispense: 30 tablet; Refill: 3  6. Antenatal screening for malformation using ultrasonics  - US  OB Comp + 14 Wk; Future  7. DVT (deep vein thrombosis) in pregnancy On lovenox  100mg  q 12 hours.  Pt and her mother would like a more natural anticoagulant. C/O nausea w/Lovenox .  Discussed that there is no acceptable alternative for Lovenox  other than Heparin  (discussed extra bloodwork involved w/heparin  therapy).  Rx diclegis  and zofran  to help combat the nausea.  However, pt states that she will finish the lovenox  that she has at home, then plans to stop it and use Tumeric instead due to her preference for a more natural treatment and her fear of needles. Discussed that there are no studies that support using Tumeric for DVT prevention in pregnancy.  Also discussed that a DVT can cause both fetal and maternal death.  Pt and her mom verbalized and understanding, and agreed to let her provider know when she plans to stop the Lovenox        Meds:  Meds ordered this encounter  Medications   Doxylamine -Pyridoxine  (DICLEGIS ) 10-10 MG TBEC    Sig: 2 po q hs and may also take one in am and one in afternoon prn nausea    Dispense:  60 tablet    Refill:  6    Supervising Provider:   JAYNE VONN DEL [[id number]]   ondansetron  (ZOFRAN -ODT) 4 MG disintegrating tablet    Sig: Take 1 tablet (4 mg total) by mouth every 6 (six) hours as needed for nausea.    Dispense:  30 tablet    Refill:  2    Supervising Provider:   JAYNE VONN H [[id number]]   montelukast  (SINGULAIR ) 10 MG tablet    Sig: Take 1 tablet (10 mg total) by mouth at bedtime.    Dispense:  30 tablet    Refill:  3    Supervising  Provider:   JAYNE VONN H [[id number]]    Initial labs obtained Continue prenatal vitamins Reviewed n/v relief measures and warning s/s to report Reviewed recommended weight gain based on pre-gravid BMI Encouraged well-balanced diet Genetic & carrier screening discussed: requests Panorama and AFP, declines NT/IT and Horizon  Ultrasound discussed; fetal survey: requested CCNC completed> form faxed if has or is planning to apply for medicaid The nature of [facility] [employer] with multiple MDs and other Advanced Practice Providers was explained to patient; also emphasized that fellows, residents, and students are part of our team. Doesn't have a  home bp cuff. Rx faxed. Check bp weekly, let us  know if >140/90.        Cathlean Cresenzo-Dishmon 7:12 PM

## 2024-07-17 ENCOUNTER — Encounter: Payer: Self-pay | Admitting: Advanced Practice Midwife

## 2024-07-17 DIAGNOSIS — O26899 Other specified pregnancy related conditions, unspecified trimester: Secondary | ICD-10-CM | POA: Insufficient documentation

## 2024-07-17 LAB — CERVICOVAGINAL ANCILLARY ONLY
Chlamydia: NEGATIVE
Comment: NEGATIVE
Comment: NORMAL
Neisseria Gonorrhea: NEGATIVE

## 2024-07-18 LAB — CBC/D/PLT+RPR+RH+ABO+RUBIGG...
Antibody Screen: NEGATIVE
Basophils Absolute: 0 x10E3/uL (ref 0.0–0.2)
Basos: 0 %
EOS (ABSOLUTE): 0.4 x10E3/uL (ref 0.0–0.4)
Eos: 3 %
HCV Ab: NONREACTIVE
HIV Screen 4th Generation wRfx: NONREACTIVE
Hematocrit: 33.9 % — ABNORMAL LOW (ref 34.0–46.6)
Hemoglobin: 10.9 g/dL — ABNORMAL LOW (ref 11.1–15.9)
Hepatitis B Surface Ag: NEGATIVE
Immature Grans (Abs): 0.1 x10E3/uL (ref 0.0–0.1)
Immature Granulocytes: 1 %
Lymphocytes Absolute: 2.1 x10E3/uL (ref 0.7–3.1)
Lymphs: 19 %
MCH: 27.9 pg (ref 26.6–33.0)
MCHC: 32.2 g/dL (ref 31.5–35.7)
MCV: 87 fL (ref 79–97)
Monocytes Absolute: 0.4 x10E3/uL (ref 0.1–0.9)
Monocytes: 4 %
Neutrophils Absolute: 8 x10E3/uL — ABNORMAL HIGH (ref 1.4–7.0)
Neutrophils: 73 %
Platelets: 180 x10E3/uL (ref 150–450)
RBC: 3.9 x10E6/uL (ref 3.77–5.28)
RDW: 13.5 % (ref 11.7–15.4)
RPR Ser Ql: REACTIVE — AB
Rh Factor: NEGATIVE
Rubella Antibodies, IGG: 3.94 {index} (ref >0.99)
WBC: 11 x10E3/uL — ABNORMAL HIGH (ref 3.4–10.8)

## 2024-07-18 LAB — RPR, QUANT+TP ABS (REFLEX)
Rapid Plasma Reagin, Quant: 1:1 {titer} — ABNORMAL HIGH
T Pallidum Abs: NONREACTIVE

## 2024-07-18 LAB — HCV INTERPRETATION

## 2024-07-18 LAB — HEMOGLOBIN A1C
Est. average glucose Bld gHb Est-mCnc: 114 mg/dL
Hgb A1c MFr Bld: 5.6 % (ref 4.8–5.6)

## 2024-07-18 LAB — URINE CULTURE

## 2024-07-23 ENCOUNTER — Ambulatory Visit: Payer: Self-pay | Admitting: Advanced Practice Midwife

## 2024-07-23 LAB — PANORAMA PRENATAL TEST FULL PANEL:PANORAMA TEST PLUS 5 ADDITIONAL MICRODELETIONS
FETAL FRACTION: 1.2
REPORT SUMMARY: INCREASED — AB
TRIPLOIDY 13 18 RESULT TEXT: INCREASED — AB

## 2024-07-24 ENCOUNTER — Encounter: Payer: Self-pay | Admitting: Family Medicine

## 2024-07-24 ENCOUNTER — Other Ambulatory Visit: Payer: Self-pay

## 2024-07-24 MED ORDER — ALBUTEROL SULFATE HFA 108 (90 BASE) MCG/ACT IN AERS: 2.0000 | INHALATION_SPRAY | RESPIRATORY_TRACT | 1 refills | Status: AC | PRN

## 2024-07-24 NOTE — Telephone Encounter (Signed)
 Refill sent to pharmacy.

## 2024-08-13 ENCOUNTER — Ambulatory Visit (INDEPENDENT_AMBULATORY_CARE_PROVIDER_SITE_OTHER): Admitting: Advanced Practice Midwife

## 2024-08-13 ENCOUNTER — Encounter: Payer: Self-pay | Admitting: Advanced Practice Midwife

## 2024-08-13 VITALS — BP 118/74 | HR 94 | Wt 213.0 lb

## 2024-08-13 DIAGNOSIS — Z3A15 15 weeks gestation of pregnancy: Secondary | ICD-10-CM | POA: Diagnosis not present

## 2024-08-13 DIAGNOSIS — O0992 Supervision of high risk pregnancy, unspecified, second trimester: Secondary | ICD-10-CM | POA: Diagnosis not present

## 2024-08-13 MED ORDER — OMEPRAZOLE MAGNESIUM 20 MG PO TBEC: 20.0000 mg | DELAYED_RELEASE_TABLET | Freq: Two times a day (BID) | ORAL | 6 refills | Status: DC

## 2024-08-13 NOTE — Patient Instructions (Addendum)
 Pinched Nerve in the Wrist (Carpal Tunnel Syndrome): What to Know  Pinched nerve in the wrist (carpal tunnel syndrome, or CTS) is a nerve problem that causes pain, numbness, and weakness in the wrist, hand, and fingers. The carpal tunnel is a narrow space that is on the palm side of your wrist. Repeated wrist motions or certain diseases may cause swelling in the tunnel. This swelling can pinch the main nerve in the wrist (the median nerve). What are the causes? CTS may be caused by: Moving your hand and wrist over and over again while doing a task. Hurting the wrist. Arthritis. A pocket of fluid (cyst) or a growth (tumor) in the carpal tunnel. Fluid buildup when you are pregnant. Use of tools that vibrate. Pregnancy In some cases, the cause of CTS is not known. What increases the risk? You're more likely to have CTS if: You have a job that makes you do these things: Move your hand firmly over and over again. Work with tools that vibrate, such as drills or sanders. You're female. You have diabetes, obesity, thyroid  problems, or kidney failure. What are the signs or symptoms? Symptoms of this condition include: A tingling feeling in your fingers. You may feel this pain in the thumb, index finger, or middle finger. Tingling or loss of feeling in your hand. Pain in your entire arm. This pain may get worse when you bend your wrist and elbow for a long time. Pain in your wrist that goes up your arm to your shoulder. Pain that goes down into your palm or fingers. Weakness in your hands. You may find it hard to grab and hold items. Your symptoms may feel worse during the night. How is this diagnosed? CTS is diagnosed with a medical history and physical exam. Tests and imaging may also be done to: Check the electrical signals sent by your nerves into the muscles. Check how well electrical signals pass through your nerves. Check possible causes of your CTS. These include X-rays, ultrasound,  and MRI. How is this treated? CTS may be treated with: Lifestyle changes. You will be asked to stop or change the activity that caused your problem. Physical therapy. This may include: Exercises that stretch and strengthen the muscles and tendons in the wrist and hand. Nerve gliding or flossing exercises. These help keep nerves moving smoothly through the tissues around them. Occupational therapy. You'll learn how to use your hand again. Medicines for pain and swelling. You may have injections in your wrist. A wrist splint or brace. Surgery. Follow these instructions at home: If you have a splint or brace: Wear the splint or brace as much as possible, but particularly at night.. Check the skin around it every day. Tell your provider if you see problems. Loosen the splint or brace if your fingers tingle, are numb, or turn cold and blue. Keep the splint or brace clean and dry. If the splint or brace isn't waterproof: Do not let it get wet. Cover it when you take a bath or shower. Use a cover that doesn't let any water in. Managing pain, stiffness, and swelling  Use ice or an ice pack as told. If you have a splint or brace that you can take off, remove it only as told. Place a towel between your skin and the ice. Leave the ice on for 20 minutes, 2-3 times a day. If your skin turns red, take off the ice right away to prevent skin damage. The risk of damage is  higher if you can't feel pain, heat, or cold. Move your fingers often to reduce stiffness and swelling. General instructions Take your medicines only as told. Rest your wrist and hand from activity that may cause pain. If your CTS is caused by things you do at work, talk with your employer about making changes. For example, you may need a wrist pad to use while typing. Exercise as told. Follow instructions on how to do nerve gliding or flossing exercises. These help keep nerves in moving smoothly through the tissues around  them. Keep all follow-up visits. This is important. Where to find more information American Academy of Orthopedic Surgeons: orthoinfo.aaos.[employer] of Neurological Disorders and Stroke: BasicFM.no Contact a health care provider if: You have new symptoms. Your pain is not controlled with medicines. Your symptoms get worse. Get help right away if: Your hand or wrist tingles or is numb, and the symptoms become very bad. This information is not intended to replace advice given to you by your health care provider. Make sure you discuss any questions you have with your health care provider. Document Revised: 10/22/2023 Document Reviewed: 08/09/2023 Elsevier Patient Education  2024 Elsevier Inc.    [place] [facility] Doctors [facility]): [address] [place], [phone]           [facility]: [address], [phone]                [facility]): [address], [phone] [facility] Department: [address], [place], [phone]    [facility] Pediatricians/Family Doctors [facility]): [phone], [phone] [facility]: [address] [place], [phone] [facility]: [address], [phone]  [facility] Doctors  [facility]): [phone] [facility]: [address] [phone]   [facility] Doctors [facility]: [address] [phone]  [facility] Doctors  [facility]: [phone], [phone]

## 2024-08-13 NOTE — Progress Notes (Signed)
 HIGH-RISK PREGNANCY VISIT Patient name: Amy [name] MRN [id number]  Date of birth: 1996-08-05 Chief Complaint:   Routine Prenatal Visit (Lose of feeling both fingers on right,left. AFP)  History of Present Illness:   Amy [name] is a 28 y.o. G23P1001 female at [redacted]w[redacted]d with an Estimated Date of Delivery: 02/01/25 being seen today for ongoing management of a high-risk pregnancy complicated by Factor 5 Leiden w/femoral DVT at 11 weeks.    Today she reports some tingling in fingers. Contractions: Not present.  .  Movement: Absent. denies leaking of fluid.  Has episodes where she coughs so hard that she vomits.  Vomits about an hour after each lovenox  injection, but doesn't really feel nauseated during the day.      07/16/2024   11:40 AM 06/17/2024   11:20 AM 04/15/2024    2:43 PM 03/27/2024   10:59 AM 10/23/2023    1:55 PM  Depression screen PHQ 2/9  Decreased Interest 1 0 0 2 0  Down, Depressed, Hopeless 0 0 0 0 0  PHQ - 2 Score 1 0 0 2 0  Altered sleeping 1  0 3   Tired, decreased energy 1  0 3   Change in appetite 1  0 3   Feeling bad or failure about yourself  0  0 0   Trouble concentrating 0  0 2   Moving slowly or fidgety/restless 0  0 0   Suicidal thoughts 0  0 0   PHQ-9 Score 4  0 13   Difficult doing work/chores    Very difficult         07/16/2024   11:40 AM 04/15/2024    2:44 PM 03/27/2024   10:59 AM 09/25/2023    2:01 PM  GAD 7 : Generalized Anxiety Score  Nervous, Anxious, on Edge 1 0 2 3  Control/stop worrying 1 0 3 1  Worry too much - different things 1 0 3 3  Trouble relaxing 1 0 3 3  Restless 1 0 2 2  Easily annoyed or irritable 2 0 3 3  Afraid - awful might happen 1 0 0 0  Total GAD 7 Score 8 0 16 15  Anxiety Difficulty   Very difficult Very difficult     Review of Systems:   Pertinent items are noted in HPI Denies abnormal vaginal discharge w/ itching/odor/irritation, headaches, visual changes, shortness of breath, chest pain,  abdominal pain, severe nausea/vomiting, or problems with urination or bowel movements unless otherwise stated above. Pertinent History Reviewed:  Reviewed past medical,surgical, social, obstetrical and family history.  Reviewed problem list, medications and allergies. Physical Assessment:   Vitals:   08/13/24 1017  BP: 118/74  Pulse: 94  Weight: 96.6 kg  Body mass index is 36.56 kg/m.           Physical Examination:   General appearance: alert, well appearing, and in no distress  Mental status: alert, oriented to person, place, and time  Skin: warm & dry   Extremities: Edema: None    Cardiovascular: normal heart rate noted  Respiratory: normal respiratory effort, no distress  Abdomen: gravid, soft, non-tender  Pelvic: Cervical exam deferred         Chaperone: N/A    Fetal Status: Fetal Heart Rate (bpm): 164   Movement: Absent    Fetal Surveillance Testing today: Doppler     No results found for this or any previous visit (from the past 24 hours).  Assessment &  Plan:  High-risk pregnancy: G2P1001 at [redacted]w[redacted]d with an Estimated Date of Delivery: 02/01/25   1. Supervision of high risk pregnancy, antepartum (Primary)   2. [redacted] weeks gestation of pregnancy  - AFP, Serum, Open Spina Bifida - PANORAMA PRENATAL TEST  3.  Factor V Leiden HETEROzygote w/DVT this pregnancy:  Pt had f/u consultation w/PCP and OB/GYN  at [facility]:  they also recommended against switchng from Lovenox  to tumeric, so pt has agreed to resume lovenox .  Will now try taking zofran  1 hour before each injection  4.  Reflux:    Meds ordered this encounter  Medications   omeprazole  (PRILOSEC  OTC) 20 MG tablet    Sig: Take 1 tablet (20 mg total) by mouth 2 (two) times daily.    Dispense:  60 tablet    Refill:  6    Supervising Provider:   JAYNE VONN DEL [[id number]]    Orders:  Orders Placed This Encounter  Procedures   AFP, Serum, Open Spina Bifida   PANORAMA PRENATAL TEST     Labs/procedures today:  Orders  Placed This Encounter  Procedures   AFP, Serum, Open Spina Bifida    Is patient insulin dependent?:   No    Weight (lbs):   213    Gestational Age (GA), weeks:   15.3    Date on which patient was at this GA:   08/13/2024    GA Calculation Method:   Ultrasound    Number of fetuses:   1   PANORAMA PRENATAL TEST    ==========Department Information========== ID: [id number] Department:[facility] [address] Amy [name] [place] Amy [name] Dept: [phone] Dept Fax: [phone]     Method/type of collection::   Clinic to manage sample collection    Expected due date (MM/DD/YYYY)::   02/20/2025    Is this a twin pregnancy? 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    Enroll this patient in the Automatic Redraw Program?:   Yes    What type of billing?:   [facility]    By placing this electronic order I confirm the testing ordered herein is medically necessary and this patient has been informed of the details of the genetic test(s) ordered, including the risks, benefits, and alternatives, and has consented to testing.:   Yes    Select an order diagnosis: For additional options refer to [email or web address].[email or web address]:   Encounter for supervision of other normal pregnancy in first trimester [[id number]]      Reviewed:  general obstetric precautions including but not limited to vaginal bleeding, contractions, leaking of fluid and fetal movement were reviewed in detail with the patient.  All questions were answered. Does have home bp cuff. Office bp cuff given: not applicable. Check bp weekly, let us   know if consistently >140 and/or >90.  Follow-up:   Future Appointments  Date Time Provider Department Center  09/10/2024  9:15 AM [facility] US  [facility] None  09/10/2024 10:10 AM Booker,  Suzen SAUNDERS, CNM [facility]    Orders Placed This Encounter  Procedures   AFP, Serum, Open Spina Bifida   PANORAMA PRENATAL TEST   Cathlean Ely , DNP, CNM [facility] 08/13/2024 10:12 PM

## 2024-08-15 LAB — AFP, SERUM, OPEN SPINA BIFIDA
AFP MoM: 1.03
AFP Value: 25.1 ng/mL
Gest. Age on Collection Date: 15.3 wk
Maternal Age At EDD: 29 a
OSBR Risk 1 IN: 10000
Test Results:: NEGATIVE
Weight: 213 [lb_av]

## 2024-08-19 LAB — PANORAMA PRENATAL TEST FULL PANEL:PANORAMA TEST PLUS 5 ADDITIONAL MICRODELETIONS: FETAL FRACTION: 2

## 2024-08-20 ENCOUNTER — Encounter: Payer: Self-pay | Admitting: Advanced Practice Midwife

## 2024-09-10 ENCOUNTER — Ambulatory Visit: Admitting: Women's Health

## 2024-09-10 ENCOUNTER — Ambulatory Visit

## 2024-09-10 ENCOUNTER — Encounter: Payer: Self-pay | Admitting: Women's Health

## 2024-09-10 VITALS — BP 97/61 | HR 74 | Wt 210.0 lb

## 2024-09-10 DIAGNOSIS — Z3480 Encounter for supervision of other normal pregnancy, unspecified trimester: Secondary | ICD-10-CM

## 2024-09-10 DIAGNOSIS — Z3A19 19 weeks gestation of pregnancy: Secondary | ICD-10-CM

## 2024-09-10 DIAGNOSIS — O2232 Deep phlebothrombosis in pregnancy, second trimester: Secondary | ICD-10-CM

## 2024-09-10 DIAGNOSIS — Z3482 Encounter for supervision of other normal pregnancy, second trimester: Secondary | ICD-10-CM

## 2024-09-10 DIAGNOSIS — Z363 Encounter for antenatal screening for malformations: Secondary | ICD-10-CM

## 2024-09-10 DIAGNOSIS — O2230 Deep phlebothrombosis in pregnancy, unspecified trimester: Secondary | ICD-10-CM

## 2024-09-10 NOTE — Progress Notes (Signed)
 LOW-RISK PREGNANCY VISIT Patient name: Amy [name] MRN [id number]  Date of birth: 11/30/1996 Chief Complaint:   Routine Prenatal Visit and Pregnancy Ultrasound  History of Present Illness:   Amy [name] is a 28 y.o. G38P1001 female at [redacted]w[redacted]d with an Estimated Date of Delivery: 02/01/25 being seen today for ongoing management of a low-risk pregnancy.   Today she reports vulvar pain and pelvic pain. Concerned she's losing weight. No n/v, is eating- but small amts, decreased appetite. Contractions: Not present.  .  Movement: Present. denies leaking of fluid.     07/16/2024   11:40 AM 06/17/2024   11:20 AM 04/15/2024    2:43 PM 03/27/2024   10:59 AM 10/23/2023    1:55 PM  Depression screen PHQ 2/9  Decreased Interest 1 0 0 2 0  Down, Depressed, Hopeless 0 0 0 0 0  PHQ - 2 Score 1 0 0 2 0  Altered sleeping 1  0 3   Tired, decreased energy 1  0 3   Change in appetite 1  0 3   Feeling bad or failure about yourself  0  0 0   Trouble concentrating 0  0 2   Moving slowly or fidgety/restless 0  0 0   Suicidal thoughts 0  0 0   PHQ-9 Score 4  0 13   Difficult doing work/chores    Very difficult         07/16/2024   11:40 AM 04/15/2024    2:44 PM 03/27/2024   10:59 AM 09/25/2023    2:01 PM  GAD 7 : Generalized Anxiety Score  Nervous, Anxious, on Edge 1 0 2 3  Control/stop worrying 1 0 3 1  Worry too much - different things 1 0 3 3  Trouble relaxing 1 0 3 3  Restless 1 0 2 2  Easily annoyed or irritable 2 0 3 3  Afraid - awful might happen 1 0 0 0  Total GAD 7 Score 8 0 16 15  Anxiety Difficulty   Very difficult Very difficult      Review of Systems:   Pertinent items are noted in HPI Denies abnormal vaginal discharge w/ itching/odor/irritation, headaches, visual changes, shortness of breath, chest pain, abdominal pain, severe nausea/vomiting, or problems with urination or bowel movements unless otherwise stated above. Pertinent History Reviewed:   Reviewed past medical,surgical, social, obstetrical and family history.  Reviewed problem list, medications and allergies. Physical Assessment:   Vitals:   09/10/24 1004  BP: 97/61  Pulse: 74  Weight: 210 lb (95.3 kg)  Body mass index is 36.05 kg/m.        Physical Examination:   General appearance: Well appearing, and in no distress  Mental status: Alert, oriented to person, place, and time  Skin: Warm & dry  Cardiovascular: Normal heart rate noted  Respiratory: Normal respiratory effort, no distress  Abdomen: Soft, gravid, nontender  Pelvic: Cervical exam deferred         Extremities: Edema: None  Fetal Status:     Movement: Present  US  19+3 wks,cephalic,posterior placenta gr 0,normal right ovary,left ovary not visualized,CX 4.2 cm,SVP of fluid 6 cm,FHR 158 bpm,EFW 305 g 57%,anatomy complete,no obvious abnormalities   Chaperone: N/A No results found for this or any previous visit (from the past 24 hours).  Assessment & Plan:  1) Low-risk pregnancy G2P1001 at [redacted]w[redacted]d with an Estimated Date of Delivery: 02/01/25   2) DVT, on Lovenox , doing well  3)  Vulvar & pelvic pain> discussed pelvic congestion and pelvic pain during pregnancy as well as tips/things she can try  4) Weight loss> 3lb since last visit, BMI 36, no n/v, just decreased appetite. Eat small frequent snacks/graze   Meds: No orders of the defined types were placed in this encounter.  Labs/procedures today: U/S  Plan:  Continue routine obstetrical care  Next visit: prefers in person    Reviewed: Preterm labor symptoms and general obstetric precautions including but not limited to vaginal bleeding, contractions, leaking of fluid and fetal movement were reviewed in detail with the patient.  All questions were answered. Does have home bp cuff. Office bp cuff given: not applicable. Check bp weekly, let us  know if consistently >140 and/or >90.  Follow-up: Return in about 4 weeks (around 10/08/2024) for LROB, MD or CNM,  in person.  Future Appointments  Date Time Provider Department Center  10/08/2024 11:30 AM Cresenzo-Dishmon, Cathlean, CNM [facility]    No orders of the defined types were placed in this encounter.  Suzen JONELLE Fetters CNM, [facility] 09/10/2024 10:29 AM

## 2024-09-10 NOTE — Progress Notes (Signed)
 US  19+3 wks,cephalic,posterior placenta gr 0,normal right ovary,left ovary not visualized,CX 4.2 cm,SVP of fluid 6 cm,FHR 158 bpm,EFW 305 g 57%,anatomy complete,no obvious abnormalities

## 2024-09-10 NOTE — Patient Instructions (Signed)
 Lauraine BRAVO, thank you for choosing our office today! We appreciate the opportunity to meet your healthcare needs. You may receive a short survey by mail, e-mail, or through [employer]. If you are happy with your care we would appreciate if you could take just a few minutes to complete the survey questions. We read all of your comments and take your feedback very seriously. Thank you again for choosing our office.  Center for [employer] Team at [facility] at [facility] ([address] [place], [place] [address]) Entrance C, located off of [address] Free 24/7 valet parking  Go to [email or web address] to register for FREE online childbirth classes  Call the office [phone]) or go to [facility] if: You begin to severe cramping Your water breaks.  Sometimes it is a big gush of fluid, sometimes it is just a trickle that keeps getting your panties wet or running down your legs You have vaginal bleeding.  It is normal to have a small amount of spotting if your cervix was checked.   [facility] Pediatricians/Family Doctors [facility]): [address] Luba BROCKS, [phone]           [facility]: [address], [phone]                [facility] Medicine [facility]): [address], [phone] (call to ask if accepting patients) [facility]: [address], [place], [phone]    [facility] Pediatricians/Family Doctors [facility]): [phone] [address], [phone] [facility]: [address] [place], [phone] [facility] [place]: [address], [phone]  [facility] Doctors  [facility]): [phone] [facility]: [address], [phone]   [facility] Doctors [facility]: [address], [phone]  [facility] Doctors  [facility]: [phone], [phone]  Home Blood Pressure Monitoring for Patients   Your provider has recommended that you check your blood pressure (BP) at least once a week at home. If you do not have a blood pressure cuff at home, one will be provided for you. Contact your provider if you have not received your monitor within 1 week.   Helpful Tips for Accurate Home Blood Pressure Checks  Don't smoke, exercise, or drink caffeine  30 minutes before checking your BP Use the restroom before checking your BP (a full bladder can raise your pressure) Relax in a comfortable upright chair Feet on the ground Left arm resting comfortably on a flat surface at the level of your heart Legs uncrossed Back supported Sit quietly and don't talk Place the cuff on your bare arm Adjust snuggly, so that only two fingertips can fit between your skin and the top of the cuff Check 2 readings separated by at least one minute Keep a log of your BP readings For a visual, please reference this diagram: http://ccnc.care/bpdiagram  Provider Name: [facility]     Phone: [phone]  Zone 1: ALL CLEAR  Continue to monitor your symptoms:  BP reading is less than 140 (top number) or less than 90 (bottom number)  No right upper stomach pain No headaches or seeing spots No feeling nauseated or throwing up No swelling in face and hands  Zone 2: CAUTION Call your doctor's office for any of the following:  BP reading is greater than 140 (top number) or greater  than 90 (bottom number)  Stomach pain under your ribs in the middle or right side Headaches or seeing spots Feeling nauseated or throwing up Swelling in face and hands  Zone 3: EMERGENCY  Seek immediate medical care if you have any of the following:  BP reading is greater than160 (top number) or greater than 110 (bottom number) Severe headaches not improving with Tylenol  Serious difficulty catching your breath Any worsening symptoms from  Zone 2     Second Trimester of Pregnancy The second trimester is from week 14 through week 27 (months 4 through 6). The second trimester is often a time when you feel your best. Your body has adjusted to being pregnant, and you begin to feel better physically. Usually, morning sickness has lessened or quit completely, you may have more energy, and you may have an increase in appetite. The second trimester is also a time when the fetus is growing rapidly. At the end of the sixth month, the fetus is about 9 inches long and weighs about 1 pounds. You will likely begin to feel the baby move (quickening) between 16 and 20 weeks of pregnancy. Body changes during your second trimester Your body continues to go through many changes during your second trimester. The changes vary from woman to woman. Your weight will continue to increase. You will notice your lower abdomen bulging out. You may begin to get stretch marks on your hips, abdomen, and breasts. You may develop headaches that can be relieved by medicines. The medicines should be approved by your health care provider. You may urinate more often because the fetus is pressing on your bladder. You may develop or continue to have heartburn as a result of your pregnancy. You may develop constipation because certain hormones are causing the muscles that push waste through your intestines to slow down. You may develop hemorrhoids or swollen, bulging veins (varicose veins). You may have back pain. This is caused by: Weight gain. Pregnancy hormones that are relaxing the joints in your pelvis. A shift in weight and the muscles that support your balance. Your breasts will continue to grow and they will continue to become tender. Your gums may bleed and may be sensitive to brushing and flossing. Dark spots or blotches (chloasma, mask of pregnancy) may develop on your face. This will likely fade after the baby is born. A dark line from your belly button to  the pubic area (linea nigra) may appear. This will likely fade after the baby is born. You may have changes in your hair. These can include thickening of your hair, rapid growth, and changes in texture. Some women also have hair loss during or after pregnancy, or hair that feels dry or thin. Your hair will most likely return to normal after your baby is born.  What to expect at prenatal visits During a routine prenatal visit: You will be weighed to make sure you and the fetus are growing normally. Your blood pressure will be taken. Your abdomen will be measured to track your baby's growth. The fetal heartbeat will be listened to. Any test results from the previous visit will be discussed.  Your health care provider may ask you: How you are feeling. If you are feeling the baby move. If you have had any abnormal symptoms, such as leaking fluid, bleeding, severe headaches, or abdominal cramping. If you are using any tobacco products, including cigarettes, chewing tobacco, and electronic cigarettes. If you have any questions.  Other tests that may be performed  during your second trimester include: Blood tests that check for: Low iron levels (anemia). High blood sugar that affects pregnant women (gestational diabetes) between 5 and 28 weeks. Rh antibodies. This is to check for a protein on red blood cells (Rh factor). Urine tests to check for infections, diabetes, or protein in the urine. An ultrasound to confirm the proper growth and development of the baby. An amniocentesis to check for possible genetic problems. Fetal screens for spina bifida and Down syndrome. HIV (human immunodeficiency virus) testing. Routine prenatal testing includes screening for HIV, unless you choose not to have this test.  Follow these instructions at home: Medicines Follow your health care provider's instructions regarding medicine use. Specific medicines may be either safe or unsafe to take during  pregnancy. Take a prenatal vitamin that contains at least 600 micrograms (mcg) of folic acid. If you develop constipation, try taking a stool softener if your health care provider approves. Eating and drinking Eat a balanced diet that includes fresh fruits and vegetables, whole grains, good sources of protein such as meat, eggs, or tofu, and low-fat dairy. Your health care provider will help you determine the amount of weight gain that is right for you. Avoid raw meat and uncooked cheese. These carry germs that can cause birth defects in the baby. If you have low calcium intake from food, talk to your health care provider about whether you should take a daily calcium supplement. Limit foods that are high in fat and processed sugars, such as fried and sweet foods. To prevent constipation: Drink enough fluid to keep your urine clear or pale yellow. Eat foods that are high in fiber, such as fresh fruits and vegetables, whole grains, and beans. Activity Exercise only as directed by your health care provider. Most women can continue their usual exercise routine during pregnancy. Try to exercise for 30 minutes at least 5 days a week. Stop exercising if you experience uterine contractions. Avoid heavy lifting, wear low heel shoes, and practice good posture. A sexual relationship may be continued unless your health care provider directs you otherwise. Relieving pain and discomfort Wear a good support bra to prevent discomfort from breast tenderness. Take warm sitz baths to soothe any pain or discomfort caused by hemorrhoids. Use hemorrhoid cream if your health care provider approves. Rest with your legs elevated if you have leg cramps or low back pain. If you develop varicose veins, wear support hose. Elevate your feet for 15 minutes, 3-4 times a day. Limit salt in your diet. Prenatal Care Write down your questions. Take them to your prenatal visits. Keep all your prenatal visits as told by your health  care provider. This is important. Safety Wear your seat belt at all times when driving. Make a list of emergency phone numbers, including numbers for family, friends, the hospital, and police and fire departments. General instructions Ask your health care provider for a referral to a local prenatal education class. Begin classes no later than the beginning of month 6 of your pregnancy. Ask for help if you have counseling or nutritional needs during pregnancy. Your health care provider can offer advice or refer you to specialists for help with various needs. Do not use hot tubs, steam rooms, or saunas. Do not douche or use tampons or scented sanitary pads. Do not cross your legs for long periods of time. Avoid cat litter boxes and soil used by cats. These carry germs that can cause birth defects in the baby and possibly loss of  the fetus by miscarriage or stillbirth. Avoid all smoking, herbs, alcohol, and unprescribed drugs. Chemicals in these products can affect the formation and growth of the baby. Do not use any products that contain nicotine or tobacco, such as cigarettes and e-cigarettes. If you need help quitting, ask your health care provider. Visit your dentist if you have not gone yet during your pregnancy. Use a soft toothbrush to brush your teeth and be gentle when you floss. Contact a health care provider if: You have dizziness. You have mild pelvic cramps, pelvic pressure, or nagging pain in the abdominal area. You have persistent nausea, vomiting, or diarrhea. You have a bad smelling vaginal discharge. You have pain when you urinate. Get help right away if: You have a fever. You are leaking fluid from your vagina. You have spotting or bleeding from your vagina. You have severe abdominal cramping or pain. You have rapid weight gain or weight loss. You have shortness of breath with chest pain. You notice sudden or extreme swelling of your face, hands, ankles, feet, or legs. You  have not felt your baby move in over an hour. You have severe headaches that do not go away when you take medicine. You have vision changes. Summary The second trimester is from week 14 through week 27 (months 4 through 6). It is also a time when the fetus is growing rapidly. Your body goes through many changes during pregnancy. The changes vary from woman to woman. Avoid all smoking, herbs, alcohol, and unprescribed drugs. These chemicals affect the formation and growth your baby. Do not use any tobacco products, such as cigarettes, chewing tobacco, and e-cigarettes. If you need help quitting, ask your health care provider. Contact your health care provider if you have any questions. Keep all prenatal visits as told by your health care provider. This is important. This information is not intended to replace advice given to you by your health care provider. Make sure you discuss any questions you have with your health care provider. Document Released: 12/04/2001 Document Revised: 05/17/2016 Document Reviewed: 02/10/2013 Elsevier Interactive Patient Education  2017 [employer].

## 2024-10-08 ENCOUNTER — Ambulatory Visit (INDEPENDENT_AMBULATORY_CARE_PROVIDER_SITE_OTHER): Admitting: Advanced Practice Midwife

## 2024-10-08 ENCOUNTER — Encounter: Payer: Self-pay | Admitting: Advanced Practice Midwife

## 2024-10-08 VITALS — BP 126/85 | HR 65 | Wt 220.0 lb

## 2024-10-08 DIAGNOSIS — O2232 Deep phlebothrombosis in pregnancy, second trimester: Secondary | ICD-10-CM | POA: Diagnosis not present

## 2024-10-08 DIAGNOSIS — O2230 Deep phlebothrombosis in pregnancy, unspecified trimester: Secondary | ICD-10-CM

## 2024-10-08 DIAGNOSIS — Z3480 Encounter for supervision of other normal pregnancy, unspecified trimester: Secondary | ICD-10-CM

## 2024-10-08 DIAGNOSIS — Z3A23 23 weeks gestation of pregnancy: Secondary | ICD-10-CM | POA: Diagnosis not present

## 2024-10-08 DIAGNOSIS — F419 Anxiety disorder, unspecified: Secondary | ICD-10-CM | POA: Diagnosis not present

## 2024-10-08 DIAGNOSIS — E66812 Obesity, class 2: Secondary | ICD-10-CM | POA: Diagnosis not present

## 2024-10-08 DIAGNOSIS — Z6839 Body mass index (BMI) 39.0-39.9, adult: Secondary | ICD-10-CM

## 2024-10-08 NOTE — Patient Instructions (Addendum)
 [facility] Fresh Test  The [facility] Fresh Test is a glucose beverage used for screening for gestational diabetes mellitus (GDM) and diabetes mellitus.     1. Before your test, do not eat or drink anything for 8-10 hours prior to your  appointment (a small amount of water is allowed and you may take any medicines you normally take). Be sure to drink lots of water the day before. 2. When you arrive, your blood will be drawn for a 'fasting' blood sugar level.  Then you will be given a sweetened carbonated beverage to drink. You should  complete drinking this beverage within five minutes. After finishing the  beverage, you will have your blood drawn exactly 1 and 2 hours later. Having  your blood drawn on time is an important part of this test. A total of three blood  samples will be done. 3. The test takes approximately 2  hours. During the test, do not have anything to  eat or drink. Do not smoke, chew gum (not even sugarless gum) or use breath mints.  4. During the test you should remain close by and seated as much as possible and  avoid walking around. You may want to bring a book or something else to  occupy your time.  5. After your test, you may eat and drink as normal. You may want to bring a snack  to eat after the test is finished. Your provider will advise you as to the results of  this test and any follow-up if necessary  If your sugar test is positive for gestational diabetes, you will be given an phone call and further instructions discussed. If you wish to know all of your test results before your next appointment, feel free to call the office, or look up your test results on Mychart.  (The range that the lab uses for normal values of the sugar test are not necessarily the range that is used for pregnant women; if your results are within the normal range, they are definitely normal.  However, if a value is deemed high by the lab, it may not be too high for a pregnant woman.  We will  need to discuss the results if your value(s) fall in the high category).     Tdap Vaccine It is recommended that you get the Tdap vaccine during the third trimester of EACH pregnancy to help protect your baby from getting pertussis (whooping cough) 27-36 weeks is the BEST time to do this so that you can pass the protection on to your baby. During pregnancy is better than after pregnancy, but if you are unable to get it during pregnancy it will be offered at the hospital. You will be offered this vaccine in the office after 27 weeks.  If you do not have health insurance, you can get the vaccine from the [facility] Department (no appointment needed).  Everyone who will be around your baby should also be up-to-date on their vaccines. Adults (who are not pregnant) only need 1 dose of Tdap during adulthood.

## 2024-10-08 NOTE — Progress Notes (Signed)
 HIGH-RISK PREGNANCY VISIT Patient name: Amy [name] MRN [id number]  Date of birth: [birth year] Chief Complaint:   Routine Prenatal Visit  History of Present Illness:   Amy [name] is a 28 y.o. G52P1001 female at [redacted]w[redacted]d with an Estimated Date of Delivery: 02/01/25 being seen today for ongoing management of a high-risk pregnancy complicated by DVT in pregnancy.    Today she reports no complaints. Contractions: Not present.  .  Movement: Present. denies leaking of fluid.      07/16/2024   11:40 AM 06/17/2024   11:20 AM 04/15/2024    2:43 PM 03/27/2024   10:59 AM 10/23/2023    1:55 PM  Depression screen PHQ 2/9  Decreased Interest 1 0 0 2 0  Down, Depressed, Hopeless 0 0 0 0 0  PHQ - 2 Score 1 0 0 2 0  Altered sleeping 1  0 3   Tired, decreased energy 1  0 3   Change in appetite 1  0 3   Feeling bad or failure about yourself  0  0 0   Trouble concentrating 0  0 2   Moving slowly or fidgety/restless 0  0 0   Suicidal thoughts 0  0 0   PHQ-9 Score 4  0 13   Difficult doing work/chores    Very difficult         07/16/2024   11:40 AM 04/15/2024    2:44 PM 03/27/2024   10:59 AM 09/25/2023    2:01 PM  GAD 7 : Generalized Anxiety Score  Nervous, Anxious, on Edge 1 0 2 3  Control/stop worrying 1 0 3 1  Worry too much - different things 1 0 3 3  Trouble relaxing 1 0 3 3  Restless 1 0 2 2  Easily annoyed or irritable 2 0 3 3  Afraid - awful might happen 1 0 0 0  Total GAD 7 Score 8 0 16 15  Anxiety Difficulty   Very difficult Very difficult     Review of Systems:   Pertinent items are noted in HPI Denies abnormal vaginal discharge w/ itching/odor/irritation, headaches, visual changes, shortness of breath, chest pain, abdominal pain, severe nausea/vomiting, or problems with urination or bowel movements unless otherwise stated above. Pertinent History Reviewed:  Reviewed past medical,surgical, social, obstetrical and family history.  Reviewed problem list,  medications and allergies. Physical Assessment:   Vitals:   10/08/24 1125  BP: 126/85  Pulse: 65  Weight: 220 lb (99.8 kg)  Body mass index is 37.76 kg/m.           Physical Examination:   General appearance: alert, well appearing, and in no distress  Mental status: alert, oriented to person, place, and time  Skin: warm & dry   Extremities: Edema: None    Cardiovascular: normal heart rate noted  Respiratory: normal respiratory effort, no distress  Abdomen: gravid, soft, non-tender  Pelvic: Cervical exam deferred         Chaperone: N/A    Fetal Status: Fetal Heart Rate (bpm): 157 Fundal Height: 25 cm Movement: Present    Fetal Surveillance Testing today: doppler     No results found for this or any previous visit (from the past 24 hours).  Assessment & Plan:  High-risk pregnancy: G2P1001 at [redacted]w[redacted]d with an Estimated Date of Delivery: 02/01/25   1. Supervision of other normal pregnancy, antepartum (Primary)   2. [redacted] weeks gestation of pregnancy   3. Class 2 severe obesity  due to excess calories with serious comorbidity and body mass index (BMI) of 39.0 to 39.9 in adult EFW next visit  4. DVT (deep vein thrombosis) in pregnancy Continue lovenox  (hasn't been taking it--thought she was dropped [facility], verified that she is still active in [place])    Meds: No orders of the defined types were placed in this encounter.   Orders:  Orders Placed This Encounter  Procedures   US  OB Follow Up     Labs/procedures today: none   Reviewed: Preterm labor symptoms and general obstetric precautions including but not limited to vaginal bleeding, contractions, leaking of fluid and fetal movement were reviewed in detail with the patient.  All questions were answered. Does have home bp cuff. Office bp cuff given: yes. Check bp weekly, let us  know if consistently >140 and/or >90.  Follow-up: Return in about 4 weeks (around 11/05/2024) for PN2/HROB; 8 weeks EFW/HROB.   No  future appointments.   Orders Placed This Encounter  Procedures   US  OB Follow Up   Cathlean Ely , DNP, CNM [facility] 10/08/2024 11:59 AM

## 2024-10-16 ENCOUNTER — Encounter: Payer: Self-pay | Admitting: Advanced Practice Midwife

## 2024-10-19 ENCOUNTER — Encounter: Payer: Self-pay | Admitting: Advanced Practice Midwife

## 2024-10-20 ENCOUNTER — Encounter: Payer: Self-pay | Admitting: Advanced Practice Midwife

## 2024-10-27 ENCOUNTER — Other Ambulatory Visit: Payer: Self-pay | Admitting: Advanced Practice Midwife

## 2024-10-27 DIAGNOSIS — F419 Anxiety disorder, unspecified: Secondary | ICD-10-CM

## 2024-10-27 DIAGNOSIS — E66812 Obesity, class 2: Secondary | ICD-10-CM

## 2024-10-27 DIAGNOSIS — Z3A27 27 weeks gestation of pregnancy: Secondary | ICD-10-CM

## 2024-10-27 DIAGNOSIS — Z3480 Encounter for supervision of other normal pregnancy, unspecified trimester: Secondary | ICD-10-CM

## 2024-10-27 DIAGNOSIS — O2230 Deep phlebothrombosis in pregnancy, unspecified trimester: Secondary | ICD-10-CM

## 2024-11-02 ENCOUNTER — Ambulatory Visit: Admitting: Radiology

## 2024-11-02 ENCOUNTER — Other Ambulatory Visit

## 2024-11-02 ENCOUNTER — Ambulatory Visit: Admitting: Obstetrics & Gynecology

## 2024-11-02 VITALS — BP 129/74 | HR 82 | Wt 226.0 lb

## 2024-11-02 DIAGNOSIS — O2232 Deep phlebothrombosis in pregnancy, second trimester: Secondary | ICD-10-CM

## 2024-11-02 DIAGNOSIS — Z6841 Body Mass Index (BMI) 40.0 and over, adult: Secondary | ICD-10-CM | POA: Diagnosis not present

## 2024-11-02 DIAGNOSIS — O99212 Obesity complicating pregnancy, second trimester: Secondary | ICD-10-CM

## 2024-11-02 DIAGNOSIS — E66812 Obesity, class 2: Secondary | ICD-10-CM | POA: Diagnosis not present

## 2024-11-02 DIAGNOSIS — Z6839 Body mass index (BMI) 39.0-39.9, adult: Secondary | ICD-10-CM

## 2024-11-02 DIAGNOSIS — Z3A27 27 weeks gestation of pregnancy: Secondary | ICD-10-CM

## 2024-11-02 DIAGNOSIS — O0992 Supervision of high risk pregnancy, unspecified, second trimester: Secondary | ICD-10-CM

## 2024-11-02 DIAGNOSIS — Z6791 Unspecified blood type, Rh negative: Secondary | ICD-10-CM

## 2024-11-02 DIAGNOSIS — O2230 Deep phlebothrombosis in pregnancy, unspecified trimester: Secondary | ICD-10-CM

## 2024-11-02 DIAGNOSIS — O361920 Maternal care for other isoimmunization, second trimester, not applicable or unspecified: Secondary | ICD-10-CM

## 2024-11-02 NOTE — Progress Notes (Signed)
 HIGH-RISK PREGNANCY VISIT Patient name: Amy [name] MRN [id number]  Date of birth: 09-Dec-1996 Chief Complaint:   Routine Prenatal Visit  History of Present Illness:   Amy [name] is a 28 y.o. G85P1001 female at [redacted]w[redacted]d with an Estimated Date of Delivery: 02/01/25 being seen today for ongoing management of a high-risk pregnancy complicated by     ICD-10-CM   1. Supervision of high risk pregnancy in second trimester  O09.92     2. DVT (deep vein thrombosis) in pregnancy  O22.30     3. [redacted] weeks gestation of pregnancy  Z3A.27     4. BMI 40.0-44.9, adult (HCC)  Z68.41      .    Today she reports no complaints. Contractions: Not present. Vag. Bleeding: None.  Movement: Present. denies leaking of fluid.      07/16/2024   11:40 AM 06/17/2024   11:20 AM 04/15/2024    2:43 PM 03/27/2024   10:59 AM 10/23/2023    1:55 PM  Depression screen PHQ 2/9  Decreased Interest 1 0 0 2 0  Down, Depressed, Hopeless 0 0 0 0 0  PHQ - 2 Score 1 0 0 2 0  Altered sleeping 1  0 3   Tired, decreased energy 1  0 3   Change in appetite 1  0 3   Feeling bad or failure about yourself  0  0 0   Trouble concentrating 0  0 2   Moving slowly or fidgety/restless 0  0 0   Suicidal thoughts 0  0 0   PHQ-9 Score 4   0  13    Difficult doing work/chores    Very difficult      Data saved with a previous flowsheet row definition        07/16/2024   11:40 AM 04/15/2024    2:44 PM 03/27/2024   10:59 AM 09/25/2023    2:01 PM  GAD 7 : Generalized Anxiety Score  Nervous, Anxious, on Edge 1 0 2 3  Control/stop worrying 1 0 3 1  Worry too much - different things 1 0 3 3  Trouble relaxing 1 0 3 3  Restless 1 0 2 2  Easily annoyed or irritable 2 0 3 3  Afraid - awful might happen 1 0 0 0  Total GAD 7 Score 8 0 16 15  Anxiety Difficulty   Very difficult Very difficult     Review of Systems:   Pertinent items are noted in HPI Denies abnormal vaginal discharge w/  itching/odor/irritation, headaches, visual changes, shortness of breath, chest pain, abdominal pain, severe nausea/vomiting, or problems with urination or bowel movements unless otherwise stated above. Pertinent History Reviewed:  Reviewed past medical,surgical, social, obstetrical and family history.  Reviewed problem list, medications and allergies. Physical Assessment:   Vitals:   11/02/24 0904  BP: 129/74  Pulse: 82  Weight: 226 lb (102.5 kg)  Body mass index is 38.79 kg/m.           Physical Examination:   General appearance: alert, well appearing, and in no distress  Mental status: alert, oriented to person, place, and time  Skin: warm & dry   Extremities:      Cardiovascular: normal heart rate noted  Respiratory: normal respiratory effort, no distress  Abdomen: gravid, soft, non-tender  Pelvic: Cervical exam deferred         Fetal Status:     Movement: Present    Fetal Surveillance  Testing today: EFW 69%   Chaperone: N/A    No results found for this or any previous visit (from the past 24 hours).  Assessment & Plan:  High-risk pregnancy: G2P1001 at [redacted]w[redacted]d with an Estimated Date of Delivery: 02/01/25      ICD-10-CM   1. Supervision of high risk pregnancy in second trimester  O09.92     2. DVT (deep vein thrombosis) in pregnancy  O22.30     3. [redacted] weeks gestation of pregnancy  Z3A.27     4. BMI 40.0-44.9, adult (HCC)  Z68.41          Meds: No orders of the defined types were placed in this encounter.   Orders: No orders of the defined types were placed in this encounter.    Labs/procedures today: U/S  Treatment Plan:  needs PN2, discussed importance of her lovenox ->declines therapy, discussed need for 2 weeks postpartum will consider, EFW 69% today  Reviewed: Preterm labor symptoms and general obstetric precautions including but not limited to vaginal bleeding, contractions, leaking of fluid and fetal movement were reviewed in detail with the patient.  All  questions were answered. Does have home bp cuff. Office bp cuff given: yes. Check bp weekly, let us  know if consistently >140 and/or >90.  Follow-up: Return for PN2 later this week, OB appt 3 weeks, repeat scan for growth 4 weeks.   Future Appointments  Date Time Provider Department Center  11/06/2024  8:30 AM [facility]  12/02/2024  9:15 AM [facility] - [facility] US  [facility] None  12/02/2024 10:10 AM Loreli Suzen BIRCH, CNM [facility]    No orders of the defined types were placed in this encounter.  Vonn VEAR Inch  Attending Physician for the Center for [facility] 11/02/2024 9:47 AM

## 2024-11-02 NOTE — Progress Notes (Signed)
 US : GA = 27 weeks Single active female fetus, cephalic, FHR = 151 bpm, fundal posterior pl, gr1, SVP = 7.8 cm, EFW 69%, 1126g, CL = 4 cm, closed, nl ov's

## 2024-11-04 ENCOUNTER — Encounter: Admitting: Advanced Practice Midwife

## 2024-11-04 ENCOUNTER — Other Ambulatory Visit

## 2024-11-04 ENCOUNTER — Other Ambulatory Visit: Admitting: Radiology

## 2024-11-04 DIAGNOSIS — Z131 Encounter for screening for diabetes mellitus: Secondary | ICD-10-CM

## 2024-11-04 DIAGNOSIS — Z3A27 27 weeks gestation of pregnancy: Secondary | ICD-10-CM

## 2024-11-04 DIAGNOSIS — Z3480 Encounter for supervision of other normal pregnancy, unspecified trimester: Secondary | ICD-10-CM

## 2024-11-06 ENCOUNTER — Other Ambulatory Visit

## 2024-11-06 ENCOUNTER — Encounter: Admitting: Obstetrics & Gynecology

## 2024-11-06 ENCOUNTER — Other Ambulatory Visit: Payer: Self-pay | Admitting: *Deleted

## 2024-11-06 MED ORDER — ACCU-CHEK SOFTCLIX LANCETS MISC: 12 refills | Status: DC

## 2024-11-06 MED ORDER — ACCU-CHEK GUIDE TEST VI STRP: ORAL_STRIP | 12 refills | Status: DC

## 2024-11-06 MED ORDER — ACCU-CHEK GUIDE ME W/DEVICE KIT: 1.0000 | PACK | 0 refills | Status: DC

## 2024-11-11 ENCOUNTER — Encounter: Payer: Self-pay | Admitting: Advanced Practice Midwife

## 2024-11-22 ENCOUNTER — Ambulatory Visit

## 2024-11-24 ENCOUNTER — Encounter: Payer: Self-pay | Admitting: Obstetrics & Gynecology

## 2024-11-24 ENCOUNTER — Ambulatory Visit: Admitting: Obstetrics & Gynecology

## 2024-11-24 VITALS — BP 138/83 | HR 109 | Wt 233.6 lb

## 2024-11-24 DIAGNOSIS — O2230 Deep phlebothrombosis in pregnancy, unspecified trimester: Secondary | ICD-10-CM

## 2024-11-24 DIAGNOSIS — O26843 Uterine size-date discrepancy, third trimester: Secondary | ICD-10-CM

## 2024-11-24 DIAGNOSIS — Z6791 Unspecified blood type, Rh negative: Secondary | ICD-10-CM

## 2024-11-24 DIAGNOSIS — O0993 Supervision of high risk pregnancy, unspecified, third trimester: Secondary | ICD-10-CM

## 2024-11-24 DIAGNOSIS — Z131 Encounter for screening for diabetes mellitus: Secondary | ICD-10-CM

## 2024-11-24 NOTE — Progress Notes (Signed)
 HIGH-RISK PREGNANCY VISIT Patient name: Amy [name] MRN [id number]  Date of birth: December 23, 1996 Chief Complaint:   Routine Prenatal Visit  History of Present Illness:   Amy [name] is a 28 y.o. G20P1001 female at [redacted]w[redacted]d with an Estimated Date of Delivery: 02/01/25 being seen today for ongoing management of a high-risk pregnancy complicated by:  -DVT in pregnancy- declined Lovenox , agreeable to postpartum treatment -Rh neg - Patient declined Glucola testing she has been checking her sugars at home.  Patient only had 4 total sugars to review.  1 fasting was elevated.  -abnormal panorama due to low FF- repeat not completed.  Normal anatomy scan  Today she reports lost her voice due to recent cold.   Contractions: Not present. Vag. Bleeding: None.  Movement: Present. denies leaking of fluid.      07/16/2024   11:40 AM 06/17/2024   11:20 AM 04/15/2024    2:43 PM 03/27/2024   10:59 AM 10/23/2023    1:55 PM  Depression screen PHQ 2/9  Decreased Interest 1 0 0 2 0  Down, Depressed, Hopeless 0 0 0 0 0  PHQ - 2 Score 1 0 0 2 0  Altered sleeping 1  0 3   Tired, decreased energy 1  0 3   Change in appetite 1  0 3   Feeling bad or failure about yourself  0  0 0   Trouble concentrating 0  0 2   Moving slowly or fidgety/restless 0  0 0   Suicidal thoughts 0  0 0   PHQ-9 Score 4   0  13    Difficult doing work/chores    Very difficult      Data saved with a previous flowsheet row definition     Current Outpatient Medications  Medication Instructions   Accu-Chek Softclix Lancets lancets Check Blood sugar four times a day   albuterol  (VENTOLIN  HFA) 108 (90 Base) MCG/ACT inhaler 2 puffs, Inhalation, Every 4 hours PRN   Blood Glucose Monitoring Suppl (ACCU-CHEK GUIDE ME) w/Device KIT 1 kit, Does not apply, As directed   budesonide  (PULMICORT ) 0.5 mg, Nebulization, 2 times daily   budesonide  (RHINOCORT  AQUA) 32 MCG/ACT nasal spray 1 spray, Each Nare, Daily PRN    Doxylamine -Pyridoxine  (DICLEGIS ) 10-10 MG TBEC 2 po q hs and may also take one in am and one in afternoon prn nausea   formoterol  (PERFOROMIST ) 20 mcg, Nebulization, 2 times daily   glucose blood (ACCU-CHEK GUIDE TEST) test strip Check blood sugar four times a day   guaiFENesin  (MUCINEX  PO) Take by mouth.   ipratropium-albuterol  (DUONEB) 0.5-2.5 (3) MG/3ML SOLN 3 mLs, Nebulization, Every 6 hours   lidocaine  (LIDODERM ) 5 % 1 patch, Transdermal, Every 24 hours, Remove & Discard patch within 12 hours or as directed by MD   montelukast  (SINGULAIR ) 10 mg, Oral, Daily at bedtime   omeprazole  (PRILOSEC  OTC) 20 mg, Oral, 2 times daily   ondansetron  (ZOFRAN -ODT) 4 mg, Oral, Every 6 hours PRN     Review of Systems:   Pertinent items are noted in HPI Denies abnormal vaginal discharge w/ itching/odor/irritation, headaches, visual changes, shortness of breath, chest pain, abdominal pain, severe nausea/vomiting, or problems with urination or bowel movements unless otherwise stated above. Pertinent History Reviewed:  Reviewed past medical,surgical, social, obstetrical and family history.  Reviewed problem list, medications and allergies. Physical Assessment:   Vitals:   11/24/24 1434  BP: 138/83  Pulse: (!) 109  Weight: 233 lb 9.6 oz (  106 kg)  Body mass index is 40.1 kg/m.           Physical Examination:   General appearance: alert, well appearing, and in no distress  Mental status: normal mood, behavior, speech, dress, motor activity, and thought processes  Skin: warm & dry   Extremities:   no edema   Cardiovascular: normal heart rate noted  Respiratory: normal respiratory effort, no distress  Abdomen: gravid, soft, non-tender  Pelvic: Cervical exam deferred         Fetal Status: Fetal Heart Rate (bpm): 150 Fundal Height: 34 cm Movement: Present    Fetal Surveillance Testing today: doppler   Chaperone: N/A    No results found for this or any previous visit (from the past 24 hours).    Assessment & Plan:  High-risk pregnancy: G2P1001 at [redacted]w[redacted]d with an Estimated Date of Delivery: 02/01/25   1) DVT in pregnancy - Declined treatment at this time  2) Unknown GDM status - Patient to continue to monitor sugars - Will also obtain A1c today  3) Rh neg - RhoGAM given  4) Large for dates []  pt scheduled for US  next week and likely will continue growth every 4wks  Meds: No orders of the defined types were placed in this encounter.   Labs/procedures today: doppler, PN2 lab work today, RhoGAM after lab drawn  Treatment Plan:  routine OB care and as outlined above  Reviewed: Preterm labor symptoms and general obstetric precautions including but not limited to vaginal bleeding, contractions, leaking of fluid and fetal movement were reviewed in detail with the patient.  All questions were answered.    Follow-up: Return in about 2 weeks (around 12/08/2024) for next week as scheduled.   Future Appointments  Date Time Provider Department Center  12/02/2024  9:15 AM [facility] - [facility] US  [facility] None  12/02/2024 10:10 AM Loreli Suzen BIRCH, CNM [facility]    Orders Placed This Encounter  Procedures   RHO (D) Immune Globulin    HgB A1c    Delon Prude, DO Attending Obstetrician & Gynecologist, Faculty Practice Center for [employer], [facility]

## 2024-11-25 ENCOUNTER — Ambulatory Visit: Payer: Self-pay | Admitting: Obstetrics & Gynecology

## 2024-11-25 LAB — HEMOGLOBIN A1C
Est. average glucose Bld gHb Est-mCnc: 123 mg/dL
Hgb A1c MFr Bld: 5.9 % — ABNORMAL HIGH (ref 4.8–5.6)

## 2024-11-26 ENCOUNTER — Encounter: Payer: Self-pay | Admitting: Obstetrics & Gynecology

## 2024-11-26 LAB — SPECIMEN STATUS REPORT

## 2024-11-30 ENCOUNTER — Other Ambulatory Visit: Admitting: Radiology

## 2024-11-30 ENCOUNTER — Encounter: Admitting: Adult Health

## 2024-11-30 LAB — SPECIMEN STATUS REPORT

## 2024-11-30 LAB — ANTIBODY SCREEN: Antibody Screen: NEGATIVE

## 2024-12-02 ENCOUNTER — Other Ambulatory Visit: Payer: Self-pay | Admitting: *Deleted

## 2024-12-02 ENCOUNTER — Other Ambulatory Visit

## 2024-12-02 ENCOUNTER — Ambulatory Visit: Admitting: Advanced Practice Midwife

## 2024-12-02 VITALS — BP 131/87 | HR 92 | Wt 233.0 lb

## 2024-12-02 DIAGNOSIS — Z3A31 31 weeks gestation of pregnancy: Secondary | ICD-10-CM

## 2024-12-02 DIAGNOSIS — O24419 Gestational diabetes mellitus in pregnancy, unspecified control: Secondary | ICD-10-CM | POA: Insufficient documentation

## 2024-12-02 DIAGNOSIS — Z3480 Encounter for supervision of other normal pregnancy, unspecified trimester: Secondary | ICD-10-CM

## 2024-12-02 DIAGNOSIS — O26843 Uterine size-date discrepancy, third trimester: Secondary | ICD-10-CM | POA: Diagnosis not present

## 2024-12-02 DIAGNOSIS — O0990 Supervision of high risk pregnancy, unspecified, unspecified trimester: Secondary | ICD-10-CM

## 2024-12-02 DIAGNOSIS — O24410 Gestational diabetes mellitus in pregnancy, diet controlled: Secondary | ICD-10-CM

## 2024-12-02 MED ORDER — METFORMIN HCL 500 MG PO TABS: 500.0000 mg | ORAL_TABLET | Freq: Every day | ORAL | 3 refills | Status: DC

## 2024-12-02 NOTE — Patient Instructions (Signed)
 Amy [name], thank you for choosing our office today! We appreciate the opportunity to meet your healthcare needs. You may receive a short survey by mail, e-mail, or through [employer]. If you are happy with your care we would appreciate if you could take just a few minutes to complete the survey questions. We read all of your comments and take your feedback very seriously. Thank you again for choosing our office.  Center for [employer] Team at [facility]  [facility] at [facility] ([address] [place], [place] [address]) Entrance C, located off of [address] Free 24/7 valet parking   CLASSES: Go to [email or web address] to register for classes (childbirth, breastfeeding, waterbirth, infant CPR, daddy bootcamp, etc.)  Call the office [phone]) or go to [facility] if: You begin to have strong, frequent contractions Your water breaks.  Sometimes it is a big gush of fluid, sometimes it is just a trickle that keeps getting your panties wet or running down your legs You have vaginal bleeding.  It is normal to have a small amount of spotting if your cervix was checked.  You don't feel your baby moving like normal.  If you don't, get you something to eat and drink and lay down and focus on feeling your baby move.   If your baby is still not moving like normal, you should call the office or go to [facility].  Call the office [phone]) or go to [facility] hospital for these signs of pre-eclampsia: Severe headache that does not go away with Tylenol  Visual changes- seeing spots, double, blurred vision Pain under your right breast or upper abdomen that does not go away with Tums or heartburn medicine Nausea and/or vomiting Severe swelling in your hands, feet, and face   Tdap Vaccine It is recommended that you get the Tdap vaccine during the third trimester of EACH pregnancy to help protect your baby from getting pertussis (whooping cough) 27-36 weeks is the BEST time to do  this so that you can pass the protection on to your baby. During pregnancy is better than after pregnancy, but if you are unable to get it during pregnancy it will be offered at the hospital.  You can get this vaccine with us , at the health department, your family doctor, or some local pharmacies Everyone who will be around your baby should also be up-to-date on their vaccines before the baby comes. Adults (who are not pregnant) only need 1 dose of Tdap during adulthood.   [facility] Pediatricians/Family Doctors [facility]): [address] Luba [place]OCKS, [phone]           [facility]: [address], [phone]                [facility] Medicine [facility]): [address], [phone] (call to ask if accepting patients) [facility] Department: [address], [place], [phone]    [facility] Pediatricians/Family Doctors [facility]): [phone] [address], [phone] [facility]: [address] [place], [phone] [facility] [place]: [address], [phone]  [facility] Doctors  [facility]): [phone] [facility]: [address], [phone]   [facility] Doctors [facility]: [address], [phone]  [facility] Family Doctors  [facility]: [phone], [phone]  Home Blood Pressure Monitoring for Patients   Your provider has recommended that you check  your blood pressure (BP) at least once a week at home. If you do not have a blood pressure cuff at home, one will be provided for you. Contact your provider if you have not received your monitor within 1 week.   Helpful Tips for Accurate Home Blood Pressure Checks  Don't smoke, exercise, or drink caffeine  30 minutes before checking your BP Use the restroom before checking your BP (a full bladder can raise your  pressure) Relax in a comfortable upright chair Feet on the ground Left arm resting comfortably on a flat surface at the level of your heart Legs uncrossed Back supported Sit quietly and don't talk Place the cuff on your bare arm Adjust snuggly, so that only two fingertips can fit between your skin and the top of the cuff Check 2 readings separated by at least one minute Keep a log of your BP readings For a visual, please reference this diagram: http://ccnc.care/bpdiagram  Provider Name: [facility]     Phone: [phone]  Zone 1: ALL CLEAR  Continue to monitor your symptoms:  BP reading is less than 140 (top number) or less than 90 (bottom number)  No right upper stomach pain No headaches or seeing spots No feeling nauseated or throwing up No swelling in face and hands  Zone 2: CAUTION Call your doctor's office for any of the following:  BP reading is greater than 140 (top number) or greater than 90 (bottom number)  Stomach pain under your ribs in the middle or right side Headaches or seeing spots Feeling nauseated or throwing up Swelling in face and hands  Zone 3: EMERGENCY  Seek immediate medical care if you have any of the following:  BP reading is greater than160 (top number) or greater than 110 (bottom number) Severe headaches not improving with Tylenol  Serious difficulty catching your breath Any worsening symptoms from Zone 2   Third Trimester of Pregnancy The third trimester is from week 29 through week 42, months 7 through 9. The third trimester is a time when the fetus is growing rapidly. At the end of the ninth month, the fetus is about 20 inches in length and weighs 6-10 pounds.  BODY CHANGES Your body goes through many changes during pregnancy. The changes vary from woman to woman.  Your weight will continue to increase. You can expect to gain 25-35 pounds (11-16 kg) by the end of the pregnancy. You may begin to get stretch marks on your hips, abdomen,  and breasts. You may urinate more often because the fetus is moving lower into your pelvis and pressing on your bladder. You may develop or continue to have heartburn as a result of your pregnancy. You may develop constipation because certain hormones are causing the muscles that push waste through your intestines to slow down. You may develop hemorrhoids or swollen, bulging veins (varicose veins). You may have pelvic pain because of the weight gain and pregnancy hormones relaxing your joints between the bones in your pelvis. Backaches may result from overexertion of the muscles supporting your posture. You may have changes in your hair. These can include thickening of your hair, rapid growth, and changes in texture. Some women also have hair loss during or after pregnancy, or hair that feels dry or thin. Your hair will most likely return to normal after your baby is born. Your breasts will continue to grow and be tender. A yellow discharge may leak from your breasts called colostrum. Your belly button may stick out. You  may feel short of breath because of your expanding uterus. You may notice the fetus dropping, or moving lower in your abdomen. You may have a bloody mucus discharge. This usually occurs a few days to a week before labor begins. Your cervix becomes thin and soft (effaced) near your due date. WHAT TO EXPECT AT YOUR PRENATAL EXAMS  You will have prenatal exams every 2 weeks until week 36. Then, you will have weekly prenatal exams. During a routine prenatal visit: You will be weighed to make sure you and the fetus are growing normally. Your blood pressure is taken. Your abdomen will be measured to track your baby's growth. The fetal heartbeat will be listened to. Any test results from the previous visit will be discussed. You may have a cervical check near your due date to see if you have effaced. At around 36 weeks, your caregiver will check your cervix. At the same time, your  caregiver will also perform a test on the secretions of the vaginal tissue. This test is to determine if a type of bacteria, Group B streptococcus, is present. Your caregiver will explain this further. Your caregiver may ask you: What your birth plan is. How you are feeling. If you are feeling the baby move. If you have had any abnormal symptoms, such as leaking fluid, bleeding, severe headaches, or abdominal cramping. If you have any questions. Other tests or screenings that may be performed during your third trimester include: Blood tests that check for low iron levels (anemia). Fetal testing to check the health, activity level, and growth of the fetus. Testing is done if you have certain medical conditions or if there are problems during the pregnancy. FALSE LABOR You may feel small, irregular contractions that eventually go away. These are called Braxton Hicks contractions, or false labor. Contractions may last for hours, days, or even weeks before true labor sets in. If contractions come at regular intervals, intensify, or become painful, it is best to be seen by your caregiver.  SIGNS OF LABOR  Menstrual-like cramps. Contractions that are 5 minutes apart or less. Contractions that start on the top of the uterus and spread down to the lower abdomen and back. A sense of increased pelvic pressure or back pain. A watery or bloody mucus discharge that comes from the vagina. If you have any of these signs before the 37th week of pregnancy, call your caregiver right away. You need to go to the hospital to get checked immediately. HOME CARE INSTRUCTIONS  Avoid all smoking, herbs, alcohol, and unprescribed drugs. These chemicals affect the formation and growth of the baby. Follow your caregiver's instructions regarding medicine use. There are medicines that are either safe or unsafe to take during pregnancy. Exercise only as directed by your caregiver. Experiencing uterine cramps is a good sign to  stop exercising. Continue to eat regular, healthy meals. Wear a good support bra for breast tenderness. Do not use hot tubs, steam rooms, or saunas. Wear your seat belt at all times when driving. Avoid raw meat, uncooked cheese, cat litter boxes, and soil used by cats. These carry germs that can cause birth defects in the baby. Take your prenatal vitamins. Try taking a stool softener (if your caregiver approves) if you develop constipation. Eat more high-fiber foods, such as fresh vegetables or fruit and whole grains. Drink plenty of fluids to keep your urine clear or pale yellow. Take warm sitz baths to soothe any pain or discomfort caused by hemorrhoids. Use hemorrhoid cream  if your caregiver approves. If you develop varicose veins, wear support hose. Elevate your feet for 15 minutes, 3-4 times a day. Limit salt in your diet. Avoid heavy lifting, wear low heal shoes, and practice good posture. Rest a lot with your legs elevated if you have leg cramps or low back pain. Visit your dentist if you have not gone during your pregnancy. Use a soft toothbrush to brush your teeth and be gentle when you floss. A sexual relationship may be continued unless your caregiver directs you otherwise. Do not travel far distances unless it is absolutely necessary and only with the approval of your caregiver. Take prenatal classes to understand, practice, and ask questions about the labor and delivery. Make a trial run to the hospital. Pack your hospital bag. Prepare the baby's nursery. Continue to go to all your prenatal visits as directed by your caregiver. SEEK MEDICAL CARE IF: You are unsure if you are in labor or if your water has broken. You have dizziness. You have mild pelvic cramps, pelvic pressure, or nagging pain in your abdominal area. You have persistent nausea, vomiting, or diarrhea. You have a bad smelling vaginal discharge. You have pain with urination. SEEK IMMEDIATE MEDICAL CARE IF:  You  have a fever. You are leaking fluid from your vagina. You have spotting or bleeding from your vagina. You have severe abdominal cramping or pain. You have rapid weight loss or gain. You have shortness of breath with chest pain. You notice sudden or extreme swelling of your face, hands, ankles, feet, or legs. You have not felt your baby move in over an hour. You have severe headaches that do not go away with medicine. You have vision changes. Document Released: 12/04/2001 Document Revised: 12/15/2013 Document Reviewed: 02/10/2013 [facility] Patient Information 2015 [place], [place]. This information is not intended to replace advice given to you by your health care provider. Make sure you discuss any questions you have with your health care provider.

## 2024-12-02 NOTE — Progress Notes (Signed)
 HIGH-RISK PREGNANCY VISIT Patient name: Amy [name] MRN [id number]  Date of birth: 15-Sep-1996 Chief Complaint:   Routine Prenatal Visit and Pregnancy Ultrasound  History of Present Illness:   Amy [name] is a 28 y.o. G50P1001 female at [redacted]w[redacted]d with an Estimated Date of Delivery: 02/01/25 being seen today for ongoing management of a high-risk pregnancy complicated by diabetes mellitus A2DM currently on Metformin 500mg  q hs starting today, as determined by FBS values in the low-mid 100s.  Two hr PP values are mostly <120 except for 2 in the 140s.  Today she reports having cramping pretty frequently. Contractions: Not present. Vag. Bleeding: None.  Movement: Present. denies leaking of fluid.      07/16/2024   11:40 AM 06/17/2024   11:20 AM 04/15/2024    2:43 PM 03/27/2024   10:59 AM 10/23/2023    1:55 PM  Depression screen PHQ 2/9  Decreased Interest 1 0 0 2 0  Down, Depressed, Hopeless 0 0 0 0 0  PHQ - 2 Score 1 0 0 2 0  Altered sleeping 1  0 3   Tired, decreased energy 1  0 3   Change in appetite 1  0 3   Feeling bad or failure about yourself  0  0 0   Trouble concentrating 0  0 2   Moving slowly or fidgety/restless 0  0 0   Suicidal thoughts 0  0 0   PHQ-9 Score 4   0  13    Difficult doing work/chores    Very difficult      Data saved with a previous flowsheet row definition        07/16/2024   11:40 AM 04/15/2024    2:44 PM 03/27/2024   10:59 AM 09/25/2023    2:01 PM  GAD 7 : Generalized Anxiety Score  Nervous, Anxious, on Edge 1 0 2 3  Control/stop worrying 1 0 3 1  Worry too much - different things 1 0 3 3  Trouble relaxing 1 0 3 3  Restless 1 0 2 2  Easily annoyed or irritable 2 0 3 3  Afraid - awful might happen 1 0 0 0  Total GAD 7 Score 8 0 16 15  Anxiety Difficulty   Very difficult Very difficult     Review of Systems:   Pertinent items are noted in HPI Denies abnormal vaginal discharge w/ itching/odor/irritation, headaches, visual  changes, shortness of breath, chest pain, abdominal pain, severe nausea/vomiting, or problems with urination or bowel movements unless otherwise stated above. Pertinent History Reviewed:  Reviewed past medical,surgical, social, obstetrical and family history.  Reviewed problem list, medications and allergies. Physical Assessment:   Vitals:   12/02/24 1010  BP: 131/87  Pulse: 92  Weight: 233 lb (105.7 kg)  Body mass index is 39.99 kg/m.           Physical Examination:   General appearance: alert, well appearing, and in no distress  Mental status: alert, oriented to person, place, and time  Skin: warm & dry   Extremities:      Cardiovascular: normal heart rate noted  Respiratory: normal respiratory effort, no distress  Abdomen: gravid, soft, non-tender  Pelvic: Cervical exam deferred         Fetal Status: Fetal Heart Rate (bpm): 146 u/s   Movement: Present    Fetal Surveillance Testing today: US  31+2 wks,cephalic,posterior placenta gr 1,AFI 16 cm,FHR 146 bpm,FHR 152 bpm,EFW 2064 g 86%  No results found for this or any previous visit (from the past 24 hours).  Assessment & Plan:  High-risk pregnancy: G2P1001 at [redacted]w[redacted]d with an Estimated Date of Delivery: 02/01/25   1) A2GDM (by fasting blood sugar values listed above); rx Metformin 500mg  q hs; starting weekly testing next week with EFW q 4wks and IOL @ 39wks; requested appt w dietician  2) Hx DVT in preg, declines treatment this preg  Meds:  Meds ordered this encounter  Medications   metFORMIN (GLUCOPHAGE) 500 MG tablet    Sig: Take 1 tablet (500 mg total) by mouth at bedtime.    Dispense:  30 tablet    Refill:  3    Supervising Provider:   MARILYNN NEST [[id number]]    Labs/procedures today: U/S  Treatment Plan: Growth u/s q4wks    2x/wk testing @ 32wks or weekly BPP    Deliver @ 39-39.6wks:______   Reviewed: Preterm labor symptoms and general obstetric precautions including but not limited to vaginal bleeding,  contractions, leaking of fluid and fetal movement were reviewed in detail with the patient.  All questions were answered. Does have home bp cuff. Office bp cuff given: not applicable. Check bp daily, let us  know if consistently >140 and/or >90.  Follow-up: Return for start weekly BPPs next week with EFW q 4 weeks; weekly HROB appts.   Future Appointments  Date Time Provider Department Center  12/10/2024 10:45 AM [facility] US  [facility] None  12/10/2024 11:30 AM Cresenzo-Dishmon, Cathlean, CNM [facility]    Orders Placed This Encounter  Procedures   US  Fetal BPP W/O Non Stress   Amy [name] CNM 12/02/2024 12:10 PM

## 2024-12-02 NOTE — Progress Notes (Signed)
 US  31+2 wks,cephalic,posterior placenta gr 1,AFI 16 cm,FHR 146 bpm,FHR 152 bpm,EFW 2064 g 86%

## 2024-12-10 ENCOUNTER — Other Ambulatory Visit

## 2024-12-10 ENCOUNTER — Ambulatory Visit: Admitting: Advanced Practice Midwife

## 2024-12-10 VITALS — BP 129/86 | HR 111 | Wt 233.0 lb

## 2024-12-10 DIAGNOSIS — O24419 Gestational diabetes mellitus in pregnancy, unspecified control: Secondary | ICD-10-CM

## 2024-12-10 DIAGNOSIS — O0990 Supervision of high risk pregnancy, unspecified, unspecified trimester: Secondary | ICD-10-CM

## 2024-12-10 DIAGNOSIS — Z3A32 32 weeks gestation of pregnancy: Secondary | ICD-10-CM

## 2024-12-10 DIAGNOSIS — Z91141 Patient's other noncompliance with medication regimen due to financial hardship: Secondary | ICD-10-CM

## 2024-12-10 DIAGNOSIS — E669 Obesity, unspecified: Secondary | ICD-10-CM | POA: Diagnosis not present

## 2024-12-10 DIAGNOSIS — O2230 Deep phlebothrombosis in pregnancy, unspecified trimester: Secondary | ICD-10-CM

## 2024-12-10 DIAGNOSIS — O24415 Gestational diabetes mellitus in pregnancy, controlled by oral hypoglycemic drugs: Secondary | ICD-10-CM

## 2024-12-10 DIAGNOSIS — O99213 Obesity complicating pregnancy, third trimester: Secondary | ICD-10-CM

## 2024-12-10 DIAGNOSIS — E66812 Obesity, class 2: Secondary | ICD-10-CM | POA: Diagnosis not present

## 2024-12-10 DIAGNOSIS — O2233 Deep phlebothrombosis in pregnancy, third trimester: Secondary | ICD-10-CM

## 2024-12-10 DIAGNOSIS — O09813 Supervision of pregnancy resulting from assisted reproductive technology, third trimester: Secondary | ICD-10-CM

## 2024-12-10 DIAGNOSIS — Z6839 Body mass index (BMI) 39.0-39.9, adult: Secondary | ICD-10-CM

## 2024-12-10 NOTE — Progress Notes (Signed)
 US  32+3 wks,cephalic,BPP 8/8,FHR 152 bpm,posterior placenta gr 1,AFI 16 cm

## 2024-12-10 NOTE — Progress Notes (Signed)
 HIGH-RISK PREGNANCY VISIT Patient name: Amy [name] MRN [id number]  Date of birth: [birth year] Chief Complaint:   Routine Prenatal Visit  History of Present Illness:   Amy [name] is a 28 y.o. G65P1001 female at [redacted]w[redacted]d with an Estimated Date of Delivery: 02/01/25 being seen today for ongoing management of a high-risk pregnancy complicated by diabetes mellitus A2DM currently on Metformin  500mg  q hs, IVF pregnancy, obesity (BMI >=35 and <40), and DVT in early pregnancy.  Declines lovenox  treatment.  FBS ~ 100-110 and  mostly 2hr pp <120.  Has not been able to afford the metformin  (covered by insurance but doesn't have the extra money for the copay).   Today she reports no complaints. Contractions: Not present. Vag. Bleeding: None.  Movement: Present. denies leaking of fluid.      07/16/2024   11:40 AM 06/17/2024   11:20 AM 04/15/2024    2:43 PM 03/27/2024   10:59 AM 10/23/2023    1:55 PM  Depression screen PHQ 2/9  Decreased Interest 1 0 0 2 0  Down, Depressed, Hopeless 0 0 0 0 0  PHQ - 2 Score 1 0 0 2 0  Altered sleeping 1  0 3   Tired, decreased energy 1  0 3   Change in appetite 1  0 3   Feeling bad or failure about yourself  0  0 0   Trouble concentrating 0  0 2   Moving slowly or fidgety/restless 0  0 0   Suicidal thoughts 0  0 0   PHQ-9 Score 4   0  13    Difficult doing work/chores    Very difficult      Data saved with a previous flowsheet row definition        07/16/2024   11:40 AM 04/15/2024    2:44 PM 03/27/2024   10:59 AM 09/25/2023    2:01 PM  GAD 7 : Generalized Anxiety Score  Nervous, Anxious, on Edge 1 0 2 3  Control/stop worrying 1 0 3 1  Worry too much - different things 1 0 3 3  Trouble relaxing 1 0 3 3  Restless 1 0 2 2  Easily annoyed or irritable 2 0 3 3  Afraid - awful might happen 1 0 0 0  Total GAD 7 Score 8 0 16 15  Anxiety Difficulty   Very difficult Very difficult     Review of Systems:   Pertinent items are noted in  HPI Denies abnormal vaginal discharge w/ itching/odor/irritation, headaches, visual changes, shortness of breath, chest pain, abdominal pain, severe nausea/vomiting, or problems with urination or bowel movements unless otherwise stated above. Pertinent History Reviewed:  Reviewed past medical,surgical, social, obstetrical and family history.  Reviewed problem list, medications and allergies. Physical Assessment:   Vitals:   12/10/24 1123  BP: 129/86  Pulse: (!) 111  Weight: 233 lb (105.7 kg)  Body mass index is 39.99 kg/m.           Physical Examination:   General appearance: alert, well appearing, and in no distress  Mental status: alert, oriented to person, place, and time  Skin: warm & dry   Extremities:      Cardiovascular: normal heart rate noted  Respiratory: normal respiratory effort, no distress  Abdomen: gravid, soft, non-tender  Pelvic: Cervical exam deferred         Chaperone: N/A    Fetal Status:     Movement: Present    Fetal  Surveillance Testing today: US  32+3 wks,cephalic,BPP 8/8,FHR 152 bpm,posterior placenta gr 1,AFI 16 cm      No results found for this or any previous visit (from the past 24 hours).  Assessment & Plan:  High-risk pregnancy: G2P1001 at [redacted]w[redacted]d with an Estimated Date of Delivery: 02/01/25   1. Supervision of high risk pregnancy, antepartum (Primary)   2. Class 2 severe obesity due to excess calories with serious comorbidity and body mass index (BMI) of 39.0 to 39.9 in adult Testing per A2DM  3. GDM, class A2 Poorly controlled FBS, unable to start meds.  Try a boiled egg at bedtime  4. DVT (deep vein thrombosis) in pregnancy Declines treatment d/t side effects    Meds: No orders of the defined types were placed in this encounter.   Orders: No orders of the defined types were placed in this encounter.    Labs/procedures today: U/S   Reviewed: Term labor symptoms and general obstetric precautions including but not limited to  vaginal bleeding, contractions, leaking of fluid and fetal movement were reviewed in detail with the patient.  All questions were answered. Does have home bp cuff. Office bp cuff given: not applicable. Check bp weekly, let us  know if consistently >140 and/or >90.  Follow-up: No follow-ups on file.   Future Appointments  Date Time Provider Department Center  12/15/2024  9:30 AM [facility] NURSE [facility]  12/15/2024  9:50 AM Delores Nidia CROME, FNP [facility]    No orders of the defined types were placed in this encounter.  Cathlean Ely , DNP, CNM [facility] 12/10/2024 11:31 AM

## 2024-12-15 ENCOUNTER — Encounter: Payer: Self-pay | Admitting: Obstetrics and Gynecology

## 2024-12-15 ENCOUNTER — Ambulatory Visit (INDEPENDENT_AMBULATORY_CARE_PROVIDER_SITE_OTHER): Admitting: Obstetrics and Gynecology

## 2024-12-15 ENCOUNTER — Other Ambulatory Visit

## 2024-12-15 VITALS — BP 123/75 | HR 93 | Wt 237.0 lb

## 2024-12-15 DIAGNOSIS — O0990 Supervision of high risk pregnancy, unspecified, unspecified trimester: Secondary | ICD-10-CM

## 2024-12-15 DIAGNOSIS — O09893 Supervision of other high risk pregnancies, third trimester: Secondary | ICD-10-CM | POA: Diagnosis not present

## 2024-12-15 DIAGNOSIS — O2233 Deep phlebothrombosis in pregnancy, third trimester: Secondary | ICD-10-CM | POA: Diagnosis not present

## 2024-12-15 DIAGNOSIS — Z6791 Unspecified blood type, Rh negative: Secondary | ICD-10-CM

## 2024-12-15 DIAGNOSIS — O2230 Deep phlebothrombosis in pregnancy, unspecified trimester: Secondary | ICD-10-CM

## 2024-12-15 DIAGNOSIS — Z3A33 33 weeks gestation of pregnancy: Secondary | ICD-10-CM

## 2024-12-15 DIAGNOSIS — O24419 Gestational diabetes mellitus in pregnancy, unspecified control: Secondary | ICD-10-CM

## 2024-12-15 DIAGNOSIS — O24415 Gestational diabetes mellitus in pregnancy, controlled by oral hypoglycemic drugs: Secondary | ICD-10-CM

## 2024-12-15 DIAGNOSIS — Z3A34 34 weeks gestation of pregnancy: Secondary | ICD-10-CM

## 2024-12-15 DIAGNOSIS — O26899 Other specified pregnancy related conditions, unspecified trimester: Secondary | ICD-10-CM

## 2024-12-15 LAB — POCT URINALYSIS DIPSTICK OB
Blood, UA: NEGATIVE
Glucose, UA: NEGATIVE
Ketones, UA: NEGATIVE
Leukocytes, UA: NEGATIVE
Nitrite, UA: NEGATIVE
POC,PROTEIN,UA: NEGATIVE

## 2024-12-15 MED ORDER — METFORMIN HCL 500 MG PO TABS: 1000.0000 mg | ORAL_TABLET | Freq: Every day | ORAL | 3 refills | Status: DC

## 2024-12-15 NOTE — Progress Notes (Signed)
" ° °  PRENATAL VISIT NOTE  Subjective:  Amy [name] is a 28 y.o. G2P1001 at [redacted]w[redacted]d being seen today for ongoing prenatal care.  She is currently monitored for the following issues for this high-risk pregnancy and has Asthma; Eczema; History of laparoscopic cholecystectomy; Right ovarian cyst; PTSD (post-traumatic stress disorder); Anxiety; Stress incontinence; Class 2 severe obesity with serious comorbidity in adult; Chronic rhinitis; Supervision of high risk pregnancy, antepartum; DVT (deep vein thrombosis) in pregnancy; Rh negative state in antepartum period; and GDM, class A2 on their problem list.  Patient reports lower pelvic pressure 2-3 days .  Contractions: Irritability. Vag. Bleeding: None.  Movement: Present. Denies leaking of fluid.   The following portions of the patient's history were reviewed and updated as appropriate: allergies, current medications, past family history, past medical history, past social history, past surgical history and problem list.   Objective:   Vitals:   12/15/24 0933 12/15/24 0947  BP: (!) 142/82 123/75  Pulse: 93 93  Weight: 237 lb (107.5 kg)     Fetal Status:  Fetal Heart Rate (bpm): 150s   Movement: Present    General: Alert, oriented and cooperative. Patient is in no acute distress.  Skin: Skin is warm and dry. No rash noted.   Cardiovascular: Normal heart rate noted  Respiratory: Normal respiratory effort, no problems with respiration noted  Abdomen: Soft, gravid, appropriate for gestational age.  Pain/Pressure: Present     Pelvic: Cervical exam performed in the presence of a chaperone Dilation: Closed Effacement (%): Thick    Extremities: Normal range of motion.  Edema: None  Mental Status: Normal mood and affect. Normal behavior. Normal judgment and thought content.  Fetal surveillance: NST FHR 150s, Variability moderate, Accelerations: present, Decelerations: absent   Assessment and Plan:  Pregnancy: G2P1001 at [redacted]w[redacted]d 1.  Supervision of high risk pregnancy, antepartum (Primary) BP and FHR normal Doing well, feeling regular movement    2. [redacted] weeks gestation of pregnancy PTL precautions given   3. GDM, class A2 NST reactive  Fastings still elevated,increase metformin  1000mg  bedtime  BPP next week   4. Rh negative state in antepartum period Rhogam 12/2  5. DVT (deep vein thrombosis) in pregnancy Declines tx    Preterm labor symptoms and general obstetric precautions including but not limited to vaginal bleeding, contractions, leaking of fluid and fetal movement were reviewed in detail with the patient. Please refer to After Visit Summary for other counseling recommendations.   Future Appointments  Date Time Provider Department Center  12/21/2024 10:45 AM [facility] - FT [facility] 2 [facility] None  12/21/2024 11:50 AM Kizzie Suzen SAUNDERS, CNM [facility]      Nidia Daring, FNP "

## 2024-12-21 ENCOUNTER — Encounter: Admitting: Women's Health

## 2024-12-21 ENCOUNTER — Other Ambulatory Visit: Admitting: Radiology

## 2024-12-24 NOTE — L&D Delivery Note (Signed)
 OB/GYN Faculty Practice Delivery Note  Amy [name] is a 29 y.o. G2P1001 s/p SVD at [redacted]w[redacted]d. She was admitted for IOL GDMA2.   ROM: 8h 54m with clear fluid GBS Status: Positive/-- (01/12 1630) adequately treated   Labor Progress: Initial SVE: 3.5/50/-2. She then progressed to complete.   Delivery Date/Time: 01/18/25 at 1804 Delivery: Called to room and patient was complete and pushing. Head delivered LOA. No nuchal cord present. Shoulder and body delivered in usual fashion. Infant with spontaneous cry, placed on mother's abdomen, dried and stimulated. Cord clamped x 2 after 1-minute delay, and cut by FOB. Placenta delivered spontaneously with gentle cord traction. Fundus firm with massage and Pitocin . Labia, perineum, vagina, and cervix inspected inspected with 2nd degree perineal laceration noted.  Baby Weight: pending  Placenta: Sent to L&D Complications: None Lacerations: 2nd degree perineal laceration repaired with 3-0 vicryl EBL: 150 mL Analgesia: Epidural   Infant:  APGAR (1 MIN): 8  APGAR (5 MINS): 9

## 2024-12-28 ENCOUNTER — Ambulatory Visit (INDEPENDENT_AMBULATORY_CARE_PROVIDER_SITE_OTHER): Admitting: Obstetrics & Gynecology

## 2024-12-28 ENCOUNTER — Ambulatory Visit

## 2024-12-28 ENCOUNTER — Other Ambulatory Visit

## 2024-12-28 VITALS — BP 134/87 | HR 84 | Wt 236.0 lb

## 2024-12-28 DIAGNOSIS — Z3A34 34 weeks gestation of pregnancy: Secondary | ICD-10-CM

## 2024-12-28 DIAGNOSIS — O24415 Gestational diabetes mellitus in pregnancy, controlled by oral hypoglycemic drugs: Secondary | ICD-10-CM

## 2024-12-28 DIAGNOSIS — O2233 Deep phlebothrombosis in pregnancy, third trimester: Secondary | ICD-10-CM

## 2024-12-28 DIAGNOSIS — Z3A35 35 weeks gestation of pregnancy: Secondary | ICD-10-CM | POA: Diagnosis not present

## 2024-12-28 DIAGNOSIS — O09893 Supervision of other high risk pregnancies, third trimester: Secondary | ICD-10-CM

## 2024-12-28 DIAGNOSIS — O24419 Gestational diabetes mellitus in pregnancy, unspecified control: Secondary | ICD-10-CM

## 2024-12-28 DIAGNOSIS — O2230 Deep phlebothrombosis in pregnancy, unspecified trimester: Secondary | ICD-10-CM

## 2024-12-28 DIAGNOSIS — O0990 Supervision of high risk pregnancy, unspecified, unspecified trimester: Secondary | ICD-10-CM

## 2024-12-28 MED ORDER — GLYBURIDE 2.5 MG PO TABS: 2.5000 mg | ORAL_TABLET | Freq: Every day | ORAL | 3 refills | Status: DC

## 2024-12-28 MED ORDER — CYCLOBENZAPRINE HCL 10 MG PO TABS: 10.0000 mg | ORAL_TABLET | Freq: Three times a day (TID) | ORAL | 1 refills | Status: AC | PRN

## 2024-12-28 NOTE — Progress Notes (Signed)
 "   HIGH-RISK PREGNANCY VISIT Patient name: Amy [name] MRN [id number]  Date of birth: 09-12-1996 Chief Complaint:   Routine Prenatal Visit  History of Present Illness:   Amy [name] is a 29 y.o. G60P1001 female at [redacted]w[redacted]d with an Estimated Date of Delivery: 02/01/25 being seen today for ongoing management of a high-risk pregnancy complicated by     ICD-10-CM   1. Supervision of high risk pregnancy, antepartum  O09.90     2. GDM, class A2: metformin  1000 mg qhs + glyburide  2.5 mg qhs  O24.419     3. DVT (deep vein thrombosis) in pregnancy: declines lovenox  therapy, will use postpartum  O22.30      .    Today she reports no complaints. Contractions: Not present. Vag. Bleeding: None.  Movement: Present. denies leaking of fluid.      07/16/2024   11:40 AM 06/17/2024   11:20 AM 04/15/2024    2:43 PM 03/27/2024   10:59 AM 10/23/2023    1:55 PM  Depression screen PHQ 2/9  Decreased Interest 1 0 0 2 0  Down, Depressed, Hopeless 0 0 0 0 0  PHQ - 2 Score 1 0 0 2 0  Altered sleeping 1  0 3   Tired, decreased energy 1  0 3   Change in appetite 1  0 3   Feeling bad or failure about yourself  0  0 0   Trouble concentrating 0  0 2   Moving slowly or fidgety/restless 0  0 0   Suicidal thoughts 0  0 0   PHQ-9 Score 4   0  13    Difficult doing work/chores    Very difficult      Data saved with a previous flowsheet row definition        07/16/2024   11:40 AM 04/15/2024    2:44 PM 03/27/2024   10:59 AM 09/25/2023    2:01 PM  GAD 7 : Generalized Anxiety Score  Nervous, Anxious, on Edge 1 0 2 3  Control/stop worrying 1 0 3 1  Worry too much - different things 1 0 3 3  Trouble relaxing 1 0 3 3  Restless 1 0 2 2  Easily annoyed or irritable 2 0 3 3  Afraid - awful might happen 1 0 0 0  Total GAD 7 Score 8 0 16 15  Anxiety Difficulty   Very difficult Very difficult     Review of Systems:   Pertinent items are noted in HPI Denies abnormal vaginal discharge w/  itching/odor/irritation, headaches, visual changes, shortness of breath, chest pain, abdominal pain, severe nausea/vomiting, or problems with urination or bowel movements unless otherwise stated above. Pertinent History Reviewed:  Reviewed past medical,surgical, social, obstetrical and family history.  Reviewed problem list, medications and allergies. Physical Assessment:   Vitals:   12/28/24 1426  BP: 134/87  Pulse: 84  Weight: 236 lb (107 kg)  Body mass index is 40.51 kg/m.           Physical Examination:   General appearance: alert, well appearing, and in no distress  Mental status: alert, oriented to person, place, and time  Skin: warm & dry   Extremities:      Cardiovascular: normal heart rate noted  Respiratory: normal respiratory effort, no distress  Abdomen: gravid, soft, non-tender  Pelvic: Cervical exam deferred         Fetal Status:     Movement: Present    Fetal  Surveillance Testing today: reactive NST  Amy [name] is at [redacted]w[redacted]d Estimated Date of Delivery: 02/01/25  NST being performed due to A2DM  Today the NST is Reactive  Fetal Monitoring:  Baseline: 140s bpm, Variability: Good {> 6 bpm), Accelerations: Reactive, and Decelerations: Absent   reactive  The accelerations are >15 bpm and more than 2 in 20 minutes  Final diagnosis:  Reactive NST  Vonn VEAR Inch, MD     Chaperone:     No results found for this or any previous visit (from the past 24 hours).  Assessment & Plan:  High-risk pregnancy: G2P1001 at [redacted]w[redacted]d with an Estimated Date of Delivery: 02/01/25      ICD-10-CM   1. Supervision of high risk pregnancy, antepartum  O09.90     2. GDM, class A2: metformin  1000 mg qhs + glyburide  2.5 mg qhs  O24.419     3. DVT (deep vein thrombosis) in pregnancy: declines lovenox  therapy, will use postpartum  O22.30          Meds:  Meds ordered this encounter  Medications   glyBURIDE  (DIABETA ) 2.5 MG tablet    Sig: Take 1 tablet (2.5 mg total)  by mouth at bedtime.    Dispense:  30 tablet    Refill:  3   cyclobenzaprine  (FLEXERIL ) 10 MG tablet    Sig: Take 1 tablet (10 mg total) by mouth every 8 (eight) hours as needed for muscle spasms.    Dispense:  30 tablet    Refill:  1    Orders: No orders of the defined types were placed in this encounter.    Labs/procedures today: NST  Treatment Plan:  keep scheduled    Follow-up: No follow-ups on file.   Future Appointments  Date Time Provider Department Center  12/31/2024 10:00 AM Kristine Shuck D, RD [facility] None  01/04/2025  2:15 PM [facility] IM[facility] 2 [facility] None  01/04/2025  3:10 PM Inch Vonn VEAR, MD [facility]  01/11/2025  1:30 PM [facility] IMG 2 [facility] None  01/11/2025  2:30 PM Inch Vonn VEAR, MD [facility]  01/18/2025  8:30 AM [facility] US  [facility] None  01/18/2025  9:30 AM Marilynn Nest, DO [facility]  01/25/2025  2:15 PM [facility] IMG 2 [facility] None  01/25/2025  3:10 PM Inch Vonn VEAR, MD [facility]  02/01/2025 10:45 AM [facility] 2 [facility] None  02/01/2025 11:30 AM Inch Vonn VEAR, MD [facility]    No orders of the defined types were placed in this encounter.  Vonn VEAR Inch  Attending Physician for the Center for [facility] 12/28/2024 3:42 PM  "

## 2024-12-31 ENCOUNTER — Encounter: Payer: Self-pay | Admitting: Nutrition

## 2024-12-31 ENCOUNTER — Encounter: Attending: Advanced Practice Midwife | Admitting: Nutrition

## 2024-12-31 DIAGNOSIS — Z3A00 Weeks of gestation of pregnancy not specified: Secondary | ICD-10-CM | POA: Diagnosis not present

## 2024-12-31 DIAGNOSIS — O24410 Gestational diabetes mellitus in pregnancy, diet controlled: Secondary | ICD-10-CM | POA: Insufficient documentation

## 2024-12-31 NOTE — Patient Instructions (Signed)
 Eat three meals and 2 small snacks as discussed today Eat 30-45 grams of carbs with meals and 15 grams of carbs for snacks. Increase healthy choices of protein. Avoid processed foods including turkey bacon. Don't skip meals Test blood sugar before breakfast and 2 hours after each meal and record on sheet. Bring sheet to all visits.

## 2024-12-31 NOTE — Progress Notes (Signed)
 Start 10:00     End 11:00   Patient was seen on 12-31-24 for Gestational Diabetes self-management at [facility].. The following learning objectives were met by the patient during this course:  States the definition of Gestational Diabetes States why dietary management is important in controlling blood glucose Describes the effects each nutrient has on blood glucose levels Demonstrates ability to create a balanced meal plan Demonstrates carbohydrate counting  States when to check blood glucose levels Demonstrates proper blood glucose monitoring techniques States the effect of stress and exercise on blood glucose levels States the importance of limiting caffeine  and abstaining from alcohol and smoking  Currently on Metformin  and Glyburide .  Blood glucose readings FBS: 99-117 mg/dl,  2 hrs post prandanil randomly; 75, 95, 107, 115, 104,  144,   Goals Eat three meals and 2 small snacks as discussed today Eat 30-45 grams of carbs with meals and 15 grams of carbs for snacks. Increase healthy choices of protein. Avoid processed foods including turkey bacon. Don't skip meals Test blood sugar before breakfast and 2 hours after each meal and record on sheet. Bring sheet to all visits. Patient instructed to monitor glucose levels: FBS: 60 - <95 2 hour: <120  *Patient received handouts: Nutrition Diabetes and Pregnancy Carbohydrate Counting List  Her eating habits are irregular. She notes she isn't hungry and doesn't eat 3 meals per day. Is under a lot of stress during this pregnancy. Sees a therapist. She hasn't been testing her blood sugars 4 times per day. Her fasting blood sugars are elevated. May need to consider a low dose of a long acting insulin.

## 2025-01-01 ENCOUNTER — Ambulatory Visit: Admitting: *Deleted

## 2025-01-01 VITALS — BP 122/85 | HR 98

## 2025-01-01 DIAGNOSIS — O24419 Gestational diabetes mellitus in pregnancy, unspecified control: Secondary | ICD-10-CM

## 2025-01-01 DIAGNOSIS — Z3A35 35 weeks gestation of pregnancy: Secondary | ICD-10-CM

## 2025-01-01 DIAGNOSIS — O0993 Supervision of high risk pregnancy, unspecified, third trimester: Secondary | ICD-10-CM

## 2025-01-01 NOTE — Progress Notes (Signed)
" ° °  NURSE VISIT- NST  SUBJECTIVE:  Amy [name] is a 29 y.o. G2P1001 female at [redacted]w[redacted]d, here for a NST for pregnancy complicated by Diabetes: A2DM.  She reports active fetal movement, contractions: none, vaginal bleeding: none, membranes: intact.   OBJECTIVE:  BP 122/85   Pulse 98   LMP 04/17/2024   Appears well, no apparent distress  No results found for this or any previous visit (from the past 24 hours).  NST: FHR baseline 150 bpm, Variability: moderate, Accelerations:present, Decelerations:  Absent= Cat 1/reactive Toco: none   ASSESSMENT: G2P1001 at [redacted]w[redacted]d with Diabetes: A2DM NST reactive  PLAN: EFM strip reviewed by Dr. Ozan   Recommendations: keep next appointment as scheduled    Amy [name]  01/01/2025 10:29 AM  "

## 2025-01-04 ENCOUNTER — Other Ambulatory Visit (HOSPITAL_COMMUNITY)
Admission: RE | Admit: 2025-01-04 | Discharge: 2025-01-04 | Disposition: A | Discharge status: Home / Self Care | Source: Ambulatory Visit | Attending: Obstetrics & Gynecology | Admitting: Obstetrics & Gynecology

## 2025-01-04 ENCOUNTER — Ambulatory Visit: Admitting: Radiology

## 2025-01-04 ENCOUNTER — Encounter: Payer: Self-pay | Admitting: Obstetrics & Gynecology

## 2025-01-04 ENCOUNTER — Ambulatory Visit: Admitting: Obstetrics & Gynecology

## 2025-01-04 VITALS — BP 139/73 | HR 92 | Wt 235.0 lb

## 2025-01-04 DIAGNOSIS — Z3A37 37 weeks gestation of pregnancy: Secondary | ICD-10-CM

## 2025-01-04 DIAGNOSIS — O09893 Supervision of other high risk pregnancies, third trimester: Secondary | ICD-10-CM | POA: Diagnosis not present

## 2025-01-04 DIAGNOSIS — Z3A36 36 weeks gestation of pregnancy: Secondary | ICD-10-CM

## 2025-01-04 DIAGNOSIS — O2233 Deep phlebothrombosis in pregnancy, third trimester: Secondary | ICD-10-CM

## 2025-01-04 DIAGNOSIS — Z6791 Unspecified blood type, Rh negative: Secondary | ICD-10-CM | POA: Diagnosis not present

## 2025-01-04 DIAGNOSIS — O0993 Supervision of high risk pregnancy, unspecified, third trimester: Secondary | ICD-10-CM | POA: Insufficient documentation

## 2025-01-04 DIAGNOSIS — O24419 Gestational diabetes mellitus in pregnancy, unspecified control: Secondary | ICD-10-CM

## 2025-01-04 DIAGNOSIS — O24415 Gestational diabetes mellitus in pregnancy, controlled by oral hypoglycemic drugs: Secondary | ICD-10-CM | POA: Diagnosis not present

## 2025-01-04 DIAGNOSIS — O26843 Uterine size-date discrepancy, third trimester: Secondary | ICD-10-CM

## 2025-01-04 DIAGNOSIS — Z3A34 34 weeks gestation of pregnancy: Secondary | ICD-10-CM

## 2025-01-04 DIAGNOSIS — O2230 Deep phlebothrombosis in pregnancy, unspecified trimester: Secondary | ICD-10-CM

## 2025-01-04 DIAGNOSIS — O0990 Supervision of high risk pregnancy, unspecified, unspecified trimester: Secondary | ICD-10-CM

## 2025-01-04 NOTE — Progress Notes (Signed)
 US : GA = 36 weeks Single active female fetus, cephalic, FHR = 151 bpm, AFI = 24 cm, MVP = 6.5 cm, BPP = 8/8, EFW 3235g, 87%, AC 94%

## 2025-01-04 NOTE — Progress Notes (Unsigned)
 "   HIGH-RISK PREGNANCY VISIT Patient name: Amy [name] MRN [id number]  Date of birth: 02/25/1996 Chief Complaint:   Routine Prenatal Visit  History of Present Illness:   Amy [name] is a 29 y.o. G35P1001 female at [redacted]w[redacted]d with an Estimated Date of Delivery: 02/01/25 being seen today for ongoing management of a high-risk pregnancy complicated by     ICD-10-CM   1. Supervision of high risk pregnancy in third trimester  O09.93     2. GDM, class A2: 1000 mg qhs with good fastings, EFW 87% with AC 94%  O24.419     3. DVT (deep vein thrombosis) in pregnancy: declines lovenox  therapy, will use postpartum  O22.30      .    Today she reports no complaints. Contractions: Irritability. Vag. Bleeding: None.  Movement: Present. denies leaking of fluid.      07/16/2024   11:40 AM 06/17/2024   11:20 AM 04/15/2024    2:43 PM 03/27/2024   10:59 AM 10/23/2023    1:55 PM  Depression screen PHQ 2/9  Decreased Interest 1 0 0 2 0  Down, Depressed, Hopeless 0 0 0 0 0  PHQ - 2 Score 1 0 0 2 0  Altered sleeping 1  0 3   Tired, decreased energy 1  0 3   Change in appetite 1  0 3   Feeling bad or failure about yourself  0  0 0   Trouble concentrating 0  0 2   Moving slowly or fidgety/restless 0  0 0   Suicidal thoughts 0  0 0   PHQ-9 Score 4   0  13    Difficult doing work/chores    Very difficult      Data saved with a previous flowsheet row definition        07/16/2024   11:40 AM 04/15/2024    2:44 PM 03/27/2024   10:59 AM 09/25/2023    2:01 PM  GAD 7 : Generalized Anxiety Score  Nervous, Anxious, on Edge 1 0 2 3  Control/stop worrying 1 0 3 1  Worry too much - different things 1 0 3 3  Trouble relaxing 1 0 3 3  Restless 1 0 2 2  Easily annoyed or irritable 2 0 3 3  Afraid - awful might happen 1 0 0 0  Total GAD 7 Score 8 0 16 15  Anxiety Difficulty   Very difficult Very difficult     Review of Systems:   Pertinent items are noted in HPI Denies abnormal  vaginal discharge w/ itching/odor/irritation, headaches, visual changes, shortness of breath, chest pain, abdominal pain, severe nausea/vomiting, or problems with urination or bowel movements unless otherwise stated above. Pertinent History Reviewed:  Reviewed past medical,surgical, social, obstetrical and family history.  Reviewed problem list, medications and allergies. Physical Assessment:   Vitals:   01/04/25 1458  BP: 139/73  Pulse: 92  Weight: 235 lb (106.6 kg)  Body mass index is 40.34 kg/m.           Physical Examination:   General appearance: alert, well appearing, and in no distress  Mental status: alert, oriented to person, place, and time  Skin: warm & dry   Extremities:      Cardiovascular: normal heart rate noted  Respiratory: normal respiratory effort, no distress  Abdomen: gravid, soft, non-tender  Pelvic: Cervical exam performed 2/th/-3        Fetal Status:     Movement: Present  Fetal Surveillance Testing today: BPP 8/8   Chaperone: Alan Fischer    No results found for this or any previous visit (from the past 24 hours).  Assessment & Plan:  High-risk pregnancy: G2P1001 at [redacted]w[redacted]d with an Estimated Date of Delivery: 02/01/25      ICD-10-CM   1. Supervision of high risk pregnancy in third trimester  O09.93     2. GDM, class A2: 1000 mg qhs with good fastings, EFW 87% with AC 94%  O24.419     3. DVT (deep vein thrombosis) in pregnancy: declines lovenox  therapy, will use postpartum  O22.30          Meds: No orders of the defined types were placed in this encounter.   Orders: No orders of the defined types were placed in this encounter.    Labs/procedures today: U/S  Treatment Plan:  continue current surveillance plan IOL 38 weeks with AC94%    Follow-up: Return for keep scheduled.   Future Appointments  Date Time Provider Department Center  01/07/2025  1:00 PM Kristine Ronal BIRCH, RD [facility] None  01/11/2025  1:30 PM [facility] 2 [facility]  None  01/11/2025  2:30 PM Jayne Vonn DEL, MD [facility]  01/18/2025  8:30 AM [facility] US  [facility] None  01/18/2025  9:30 AM Marilynn Nest, DO [facility]  01/25/2025  2:15 PM [facility] 2 [facility] None  01/25/2025  3:10 PM Jayne Vonn DEL, MD [facility]  02/01/2025 10:45 AM [facility] 2 [facility] None  02/01/2025 11:30 AM Jayne Vonn DEL, MD [facility]    No orders of the defined types were placed in this encounter.  Vonn DEL Jayne  Attending Physician for the Center for [facility] 01/04/2025 3:49 PM  "

## 2025-01-06 LAB — CERVICOVAGINAL ANCILLARY ONLY
Chlamydia: NEGATIVE
Comment: NEGATIVE
Comment: NORMAL
Neisseria Gonorrhea: NEGATIVE

## 2025-01-07 ENCOUNTER — Encounter: Admitting: Nutrition

## 2025-01-07 VITALS — Ht 63.0 in | Wt 236.0 lb

## 2025-01-07 DIAGNOSIS — O24410 Gestational diabetes mellitus in pregnancy, diet controlled: Secondary | ICD-10-CM | POA: Diagnosis not present

## 2025-01-07 DIAGNOSIS — O24419 Gestational diabetes mellitus in pregnancy, unspecified control: Secondary | ICD-10-CM

## 2025-01-07 LAB — CULTURE, BETA STREP (GROUP B ONLY): Strep Gp B Culture: POSITIVE — AB

## 2025-01-07 NOTE — Patient Instructions (Signed)
 Goals Eat three meals and 2 small snacks as discussed today Eat 30-45 grams of carbs with meals and 15 grams of carbs for snacks. Increase healthy choices of protein. Avoid processed foods including turkey bacon. Don't skip meals Test blood sugar before breakfast and 2 hours after each meal and record on sheet. Bring sheet to all visits. Patient instructed to monitor glucose levels: FBS: 60 - <95 2 hour: <120

## 2025-01-07 NOTE — Progress Notes (Signed)
 Begin: 1300  pm  End 1330 pm  Patient was seen on 12-31-24 for  Follow up of Gestational Diabetes s  FBS's 90-94 mg/dl  2 hours aftermeals 857 mg/dl, 862, 850,   She is only eating 1-2 times per day. Not checking blood sugars consistently 2 hours after meals. Says she isn't hungry. Has been checking BS late at night -more than 2 hours after meals and they are > 120 mg/dl occassionally. She reports eating fruit, vegetables and cheese.  She is not eating 6  small meals per day.  Goals Eat three meals and 2 small snacks as discussed today Eat 30-45 grams of carbs with meals and 15 grams of carbs for snacks. Increase healthy choices of protein. Avoid processed foods including turkey bacon. Don't skip meals Test blood sugar before breakfast and 2 hours after each meal and record on sheet. Bring sheet to all visits. Patient instructed to monitor glucose levels: FBS: 60 - <95 2 hour: <120  Advised to follow up on BSs with OBGYN and check numbers postpartum as she is high risk for Type 2 DM .

## 2025-01-11 ENCOUNTER — Ambulatory Visit: Admitting: Radiology

## 2025-01-11 ENCOUNTER — Ambulatory Visit: Admitting: Obstetrics & Gynecology

## 2025-01-11 ENCOUNTER — Encounter: Payer: Self-pay | Admitting: Obstetrics & Gynecology

## 2025-01-11 VITALS — BP 130/88 | HR 92 | Wt 238.6 lb

## 2025-01-11 DIAGNOSIS — O24419 Gestational diabetes mellitus in pregnancy, unspecified control: Secondary | ICD-10-CM

## 2025-01-11 DIAGNOSIS — O09893 Supervision of other high risk pregnancies, third trimester: Secondary | ICD-10-CM

## 2025-01-11 DIAGNOSIS — Z6791 Unspecified blood type, Rh negative: Secondary | ICD-10-CM | POA: Diagnosis not present

## 2025-01-11 DIAGNOSIS — Z3A34 34 weeks gestation of pregnancy: Secondary | ICD-10-CM

## 2025-01-11 DIAGNOSIS — O2233 Deep phlebothrombosis in pregnancy, third trimester: Secondary | ICD-10-CM

## 2025-01-11 DIAGNOSIS — O2230 Deep phlebothrombosis in pregnancy, unspecified trimester: Secondary | ICD-10-CM

## 2025-01-11 DIAGNOSIS — O24415 Gestational diabetes mellitus in pregnancy, controlled by oral hypoglycemic drugs: Secondary | ICD-10-CM | POA: Diagnosis not present

## 2025-01-11 DIAGNOSIS — O0993 Supervision of high risk pregnancy, unspecified, third trimester: Secondary | ICD-10-CM

## 2025-01-11 DIAGNOSIS — O0990 Supervision of high risk pregnancy, unspecified, unspecified trimester: Secondary | ICD-10-CM

## 2025-01-11 DIAGNOSIS — Z3A37 37 weeks gestation of pregnancy: Secondary | ICD-10-CM

## 2025-01-11 NOTE — Progress Notes (Signed)
 "   HIGH-RISK PREGNANCY VISIT Patient name: Amy [name] MRN [id number]  Date of birth: [birth year] Chief Complaint:   Routine Prenatal Visit  History of Present Illness:   Amy [name] is a 29 y.o. G66P1001 female at [redacted]w[redacted]d with an Estimated Date of Delivery: 02/01/25 being seen today for ongoing management of a high-risk pregnancy complicated by     ICD-10-CM   1. Supervision of high risk pregnancy in third trimester  O09.93     2. GDM, class A2: 1000 mg qhs with good fastings, EFW 87% with AC 94%  O24.419     3. DVT (deep vein thrombosis) in pregnancy: declines lovenox  therapy, will use postpartum  O22.30     4. [redacted] weeks gestation of pregnancy  Z3A.37      .    Today she reports no complaints. Contractions: Irritability. Vag. Bleeding: None.  Movement: Present. denies leaking of fluid.      07/16/2024   11:40 AM 06/17/2024   11:20 AM 04/15/2024    2:43 PM 03/27/2024   10:59 AM 10/23/2023    1:55 PM  Depression screen PHQ 2/9  Decreased Interest 1 0 0 2 0  Down, Depressed, Hopeless 0 0 0 0 0  PHQ - 2 Score 1 0 0 2 0  Altered sleeping 1  0 3   Tired, decreased energy 1  0 3   Change in appetite 1  0 3   Feeling bad or failure about yourself  0  0 0   Trouble concentrating 0  0 2   Moving slowly or fidgety/restless 0  0 0   Suicidal thoughts 0  0 0   PHQ-9 Score 4   0  13    Difficult doing work/chores    Very difficult      Data saved with a previous flowsheet row definition        07/16/2024   11:40 AM 04/15/2024    2:44 PM 03/27/2024   10:59 AM 09/25/2023    2:01 PM  GAD 7 : Generalized Anxiety Score  Nervous, Anxious, on Edge 1  0  2  3   Control/stop worrying 1  0  3  1   Worry too much - different things 1  0  3  3   Trouble relaxing 1  0  3  3   Restless 1  0  2  2   Easily annoyed or irritable 2  0  3  3   Afraid - awful might happen 1  0  0  0   Total GAD 7 Score 8 0 16 15  Anxiety Difficulty   Very difficult Very difficult      Data saved with a previous flowsheet row definition     Review of Systems:   Pertinent items are noted in HPI Denies abnormal vaginal discharge w/ itching/odor/irritation, headaches, visual changes, shortness of breath, chest pain, abdominal pain, severe nausea/vomiting, or problems with urination or bowel movements unless otherwise stated above. Pertinent History Reviewed:  Reviewed past medical,surgical, social, obstetrical and family history.  Reviewed problem list, medications and allergies. Physical Assessment:   Vitals:   01/11/25 1441  BP: 130/88  Pulse: 92  Weight: 238 lb 9.6 oz (108.2 kg)  Body mass index is 42.27 kg/m.           Physical Examination:   General appearance: alert, well appearing, and in no distress  Mental status: alert, oriented to person, place,  and time  Skin: warm & dry   Extremities: Edema: None    Cardiovascular: normal heart rate noted  Respiratory: normal respiratory effort, no distress  Abdomen: gravid, soft, non-tender  Pelvic: 3-4/50/-3         Fetal Status:     Movement: Present    Fetal Surveillance Testing today: BPP 8/8   Chaperone: Latisha Cresenzo    No results found for this or any previous visit (from the past 24 hours).  Assessment & Plan:  High-risk pregnancy: G2P1001 at [redacted]w[redacted]d with an Estimated Date of Delivery: 02/01/25      ICD-10-CM   1. Supervision of high risk pregnancy in third trimester  O09.93     2. GDM, class A2: 1000 mg qhs with good fastings, EFW 87% with AC 94%  O24.419     3. DVT (deep vein thrombosis) in pregnancy: declines lovenox  therapy, will use postpartum  O22.30     4. [redacted] weeks gestation of pregnancy  Z3A.37          Meds: No orders of the defined types were placed in this encounter.   Orders: No orders of the defined types were placed in this encounter.    Labs/procedures today: U/S  Treatment Plan:  IOL 38 w-->A2DM requiring 2 meds AC 94% EFW 87%    Follow-up: Return for IOL 1  week.   Future Appointments  Date Time Provider Department Center  01/18/2025  6:30 AM [facility] SCHED ROOM [facility] None  01/18/2025  8:30 AM [facility] US  [facility] None  01/18/2025  9:30 AM Ozan, Jennifer, DO [facility]  01/25/2025  2:15 PM [facility] IMG 2 [facility] None  01/25/2025  3:10 PM Jayne Vonn DEL, MD [facility]  02/01/2025 10:45 AM [facility] IMG 2 [facility] None  02/01/2025 11:30 AM Jayne Vonn DEL, MD [facility]    No orders of the defined types were placed in this encounter.  Vonn DEL Jayne  Attending Physician for the Center for [facility] 01/11/2025 3:36 PM  "

## 2025-01-11 NOTE — Progress Notes (Signed)
 US : GA = 37 weeks Single active female fetus, cephalic, FHR=165 bpm, fundal posterior pl, gr1, AFI = 16.2cm, MVP = 4.9cm, BPP = 8/8

## 2025-01-12 ENCOUNTER — Encounter (HOSPITAL_COMMUNITY): Payer: Self-pay | Admitting: *Deleted

## 2025-01-12 ENCOUNTER — Telehealth (HOSPITAL_COMMUNITY): Payer: Self-pay | Admitting: *Deleted

## 2025-01-12 NOTE — Telephone Encounter (Signed)
 Preadmission screen

## 2025-01-17 ENCOUNTER — Encounter: Payer: Self-pay | Admitting: Obstetrics and Gynecology

## 2025-01-17 ENCOUNTER — Other Ambulatory Visit: Payer: Self-pay | Admitting: Obstetrics and Gynecology

## 2025-01-17 ENCOUNTER — Inpatient Hospital Stay (HOSPITAL_COMMUNITY)
Admission: RE | Admit: 2025-01-17 | Discharge: 2025-01-20 | DRG: 806 | Disposition: A | Discharge status: Home / Self Care | Source: Ambulatory Visit | Attending: Obstetrics & Gynecology | Admitting: Obstetrics & Gynecology

## 2025-01-17 DIAGNOSIS — O9902 Anemia complicating childbirth: Secondary | ICD-10-CM | POA: Diagnosis present

## 2025-01-17 DIAGNOSIS — O9912 Other diseases of the blood and blood-forming organs and certain disorders involving the immune mechanism complicating childbirth: Secondary | ICD-10-CM | POA: Diagnosis present

## 2025-01-17 DIAGNOSIS — Z6841 Body Mass Index (BMI) 40.0 and over, adult: Secondary | ICD-10-CM | POA: Insufficient documentation

## 2025-01-17 DIAGNOSIS — Z86718 Personal history of other venous thrombosis and embolism: Secondary | ICD-10-CM

## 2025-01-17 DIAGNOSIS — D696 Thrombocytopenia, unspecified: Secondary | ICD-10-CM | POA: Diagnosis present

## 2025-01-17 DIAGNOSIS — Z3A37 37 weeks gestation of pregnancy: Secondary | ICD-10-CM

## 2025-01-17 DIAGNOSIS — K219 Gastro-esophageal reflux disease without esophagitis: Secondary | ICD-10-CM | POA: Diagnosis present

## 2025-01-17 DIAGNOSIS — F419 Anxiety disorder, unspecified: Secondary | ICD-10-CM | POA: Diagnosis present

## 2025-01-17 DIAGNOSIS — O24425 Gestational diabetes mellitus in childbirth, controlled by oral hypoglycemic drugs: Principal | ICD-10-CM | POA: Diagnosis present

## 2025-01-17 DIAGNOSIS — Z87891 Personal history of nicotine dependence: Secondary | ICD-10-CM | POA: Diagnosis not present

## 2025-01-17 DIAGNOSIS — E66813 Obesity, class 3: Secondary | ICD-10-CM | POA: Diagnosis present

## 2025-01-17 DIAGNOSIS — O99824 Streptococcus B carrier state complicating childbirth: Secondary | ICD-10-CM | POA: Diagnosis present

## 2025-01-17 DIAGNOSIS — O26899 Other specified pregnancy related conditions, unspecified trimester: Secondary | ICD-10-CM

## 2025-01-17 DIAGNOSIS — Z833 Family history of diabetes mellitus: Secondary | ICD-10-CM | POA: Diagnosis not present

## 2025-01-17 DIAGNOSIS — O26893 Other specified pregnancy related conditions, third trimester: Secondary | ICD-10-CM | POA: Diagnosis present

## 2025-01-17 DIAGNOSIS — Z8249 Family history of ischemic heart disease and other diseases of the circulatory system: Secondary | ICD-10-CM | POA: Diagnosis not present

## 2025-01-17 DIAGNOSIS — Z79899 Other long term (current) drug therapy: Secondary | ICD-10-CM

## 2025-01-17 DIAGNOSIS — O1404 Mild to moderate pre-eclampsia, complicating childbirth: Secondary | ICD-10-CM | POA: Diagnosis present

## 2025-01-17 DIAGNOSIS — O1400 Mild to moderate pre-eclampsia, unspecified trimester: Secondary | ICD-10-CM | POA: Insufficient documentation

## 2025-01-17 DIAGNOSIS — O99214 Obesity complicating childbirth: Secondary | ICD-10-CM | POA: Diagnosis present

## 2025-01-17 DIAGNOSIS — O99210 Obesity complicating pregnancy, unspecified trimester: Secondary | ICD-10-CM | POA: Diagnosis present

## 2025-01-17 DIAGNOSIS — Z6791 Unspecified blood type, Rh negative: Secondary | ICD-10-CM

## 2025-01-17 DIAGNOSIS — Z7951 Long term (current) use of inhaled steroids: Secondary | ICD-10-CM | POA: Diagnosis not present

## 2025-01-17 DIAGNOSIS — O0990 Supervision of high risk pregnancy, unspecified, unspecified trimester: Secondary | ICD-10-CM

## 2025-01-17 DIAGNOSIS — O9962 Diseases of the digestive system complicating childbirth: Secondary | ICD-10-CM | POA: Diagnosis present

## 2025-01-17 DIAGNOSIS — O24419 Gestational diabetes mellitus in pregnancy, unspecified control: Secondary | ICD-10-CM

## 2025-01-17 DIAGNOSIS — O2230 Deep phlebothrombosis in pregnancy, unspecified trimester: Secondary | ICD-10-CM | POA: Diagnosis present

## 2025-01-17 LAB — CBC
HCT: 33.8 % — ABNORMAL LOW (ref 36.0–46.0)
Hemoglobin: 11.5 g/dL — ABNORMAL LOW (ref 12.0–15.0)
MCH: 27.4 pg (ref 26.0–34.0)
MCHC: 34 g/dL (ref 30.0–36.0)
MCV: 80.7 fL (ref 80.0–100.0)
Platelets: 139 10*3/uL — ABNORMAL LOW (ref 150–400)
RBC: 4.19 MIL/uL (ref 3.87–5.11)
RDW: 14.4 % (ref 11.5–15.5)
WBC: 9.7 10*3/uL (ref 4.0–10.5)
nRBC: 0 % (ref 0.0–0.2)

## 2025-01-17 LAB — COMPREHENSIVE METABOLIC PANEL WITH GFR
ALT: 32 U/L (ref 0–44)
AST: 25 U/L (ref 15–41)
Albumin: 3.6 g/dL (ref 3.5–5.0)
Alkaline Phosphatase: 175 U/L — ABNORMAL HIGH (ref 38–126)
Anion gap: 12 (ref 5–15)
BUN: 11 mg/dL (ref 6–20)
CO2: 22 mmol/L (ref 22–32)
Calcium: 8.8 mg/dL — ABNORMAL LOW (ref 8.9–10.3)
Chloride: 102 mmol/L (ref 98–111)
Creatinine, Ser: 0.62 mg/dL (ref 0.44–1.00)
GFR, Estimated: >60 mL/min
Glucose, Bld: 109 mg/dL — ABNORMAL HIGH (ref 70–99)
Potassium: 4 mmol/L (ref 3.5–5.1)
Sodium: 136 mmol/L (ref 135–145)
Total Bilirubin: <0.2 mg/dL (ref 0.0–1.2)
Total Protein: 6.5 g/dL (ref 6.5–8.1)

## 2025-01-17 LAB — GLUCOSE, CAPILLARY: Glucose-Capillary: 116 mg/dL — ABNORMAL HIGH (ref 70–99)

## 2025-01-17 MED ORDER — ACETAMINOPHEN 325 MG PO TABS
650.0000 mg | ORAL_TABLET | ORAL | Status: DC | PRN
  Administered 2025-01-18: 650 mg via ORAL
  Filled 2025-01-17: qty 2

## 2025-01-17 MED ORDER — PENICILLIN G POT IN DEXTROSE 60000 UNIT/ML IV SOLN
3.0000 10*6.[IU] | INTRAVENOUS | Status: DC
  Administered 2025-01-18 (×4): 3 10*6.[IU] via INTRAVENOUS
  Filled 2025-01-17 (×4): qty 50

## 2025-01-17 MED ORDER — OXYTOCIN-SODIUM CHLORIDE 30-0.9 UT/500ML-% IV SOLN
2.5000 [IU]/h | INTRAVENOUS | Status: DC
  Filled 2025-01-17 (×2): qty 500

## 2025-01-17 MED ORDER — LACTATED RINGERS IV SOLN: 500.0000 mL | INTRAVENOUS | Status: DC | PRN

## 2025-01-17 MED ORDER — LIDOCAINE HCL (PF) 1 % IJ SOLN: 30.0000 mL | INTRAMUSCULAR | Status: DC | PRN

## 2025-01-17 MED ORDER — FENTANYL CITRATE (PF) 100 MCG/2ML IJ SOLN: 50.0000 ug | INTRAMUSCULAR | Status: DC | PRN

## 2025-01-17 MED ORDER — TERBUTALINE SULFATE 1 MG/ML IJ SOLN: 0.2500 mg | Freq: Once | INTRAMUSCULAR | Status: DC | PRN

## 2025-01-17 MED ORDER — SOD CITRATE-CITRIC ACID 500-334 MG/5ML PO SOLN: 30.0000 mL | ORAL | Status: DC | PRN

## 2025-01-17 MED ORDER — OXYTOCIN-SODIUM CHLORIDE 30-0.9 UT/500ML-% IV SOLN
1.0000 m[IU]/min | INTRAVENOUS | Status: DC
  Administered 2025-01-17: 2 m[IU]/min via INTRAVENOUS

## 2025-01-17 MED ORDER — OXYTOCIN BOLUS FROM INFUSION
333.0000 mL | Freq: Once | INTRAVENOUS | Status: AC
  Administered 2025-01-18: 333 mL via INTRAVENOUS

## 2025-01-17 MED ORDER — ONDANSETRON HCL 4 MG/2ML IJ SOLN
4.0000 mg | Freq: Four times a day (QID) | INTRAMUSCULAR | Status: DC | PRN
  Administered 2025-01-18: 4 mg via INTRAVENOUS
  Filled 2025-01-17: qty 2

## 2025-01-17 MED ORDER — SODIUM CHLORIDE 0.9 % IV SOLN
5.0000 10*6.[IU] | Freq: Once | INTRAVENOUS | Status: AC
  Administered 2025-01-17: 5 10*6.[IU] via INTRAVENOUS
  Filled 2025-01-17: qty 5

## 2025-01-17 MED ORDER — LACTATED RINGERS IV SOLN: INTRAVENOUS | Status: DC
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
## 2025-01-18 ENCOUNTER — Encounter: Admitting: Obstetrics & Gynecology

## 2025-01-18 ENCOUNTER — Inpatient Hospital Stay (HOSPITAL_COMMUNITY): Admitting: Anesthesiology

## 2025-01-18 ENCOUNTER — Other Ambulatory Visit

## 2025-01-18 ENCOUNTER — Encounter (HOSPITAL_COMMUNITY): Payer: Self-pay | Admitting: Obstetrics and Gynecology

## 2025-01-18 ENCOUNTER — Inpatient Hospital Stay (HOSPITAL_COMMUNITY)

## 2025-01-18 LAB — GLUCOSE, CAPILLARY
Glucose-Capillary: 97 mg/dL (ref 70–99)
Glucose-Capillary: 111 mg/dL — ABNORMAL HIGH (ref 70–99)
Glucose-Capillary: 89 mg/dL (ref 70–99)
Glucose-Capillary: 74 mg/dL (ref 70–99)
Glucose-Capillary: 91 mg/dL (ref 70–99)

## 2025-01-18 LAB — COMPREHENSIVE METABOLIC PANEL WITH GFR
ALT: 29 U/L (ref 0–44)
AST: 26 U/L (ref 15–41)
Albumin: 3.3 g/dL — ABNORMAL LOW (ref 3.5–5.0)
Alkaline Phosphatase: 169 U/L — ABNORMAL HIGH (ref 38–126)
Anion gap: 12 (ref 5–15)
BUN: 11 mg/dL (ref 6–20)
CO2: 20 mmol/L — ABNORMAL LOW (ref 22–32)
Calcium: 8.6 mg/dL — ABNORMAL LOW (ref 8.9–10.3)
Chloride: 100 mmol/L (ref 98–111)
Creatinine, Ser: 0.71 mg/dL (ref 0.44–1.00)
GFR, Estimated: >60 mL/min
Glucose, Bld: 102 mg/dL — ABNORMAL HIGH (ref 70–99)
Potassium: 4.9 mmol/L (ref 3.5–5.1)
Sodium: 133 mmol/L — ABNORMAL LOW (ref 135–145)
Total Bilirubin: 0.3 mg/dL (ref 0.0–1.2)
Total Protein: 6.1 g/dL — ABNORMAL LOW (ref 6.5–8.1)

## 2025-01-18 LAB — CBC
HCT: 33.6 % — ABNORMAL LOW (ref 36.0–46.0)
Hemoglobin: 11.1 g/dL — ABNORMAL LOW (ref 12.0–15.0)
MCH: 26.9 pg (ref 26.0–34.0)
MCHC: 33 g/dL (ref 30.0–36.0)
MCV: 81.6 fL (ref 80.0–100.0)
Platelets: 130 10*3/uL — ABNORMAL LOW (ref 150–400)
RBC: 4.12 MIL/uL (ref 3.87–5.11)
RDW: 14.5 % (ref 11.5–15.5)
WBC: 12.8 10*3/uL — ABNORMAL HIGH (ref 4.0–10.5)
nRBC: 0 % (ref 0.0–0.2)

## 2025-01-18 LAB — TYPE AND SCREEN
ABO/RH(D): O NEG
Antibody Screen: POSITIVE

## 2025-01-18 LAB — SYPHILIS: RPR W/REFLEX TO RPR TITER AND TREPONEMAL ANTIBODIES, TRADITIONAL SCREENING AND DIAGNOSIS ALGORITHM
RPR Ser Ql: REACTIVE — AB
RPR Titer: 1:1 {titer}

## 2025-01-18 LAB — PROTEIN / CREATININE RATIO, URINE
Creatinine, Urine: 159 mg/dL
Protein Creatinine Ratio: 0.5 mg/mg — ABNORMAL HIGH
Total Protein, Urine: 73 mg/dL

## 2025-01-18 MED ORDER — EPHEDRINE 5 MG/ML INJ: 10.0000 mg | INTRAVENOUS | Status: DC | PRN

## 2025-01-18 MED ORDER — ACETAMINOPHEN 325 MG PO TABS
650.0000 mg | ORAL_TABLET | ORAL | Status: DC | PRN
  Administered 2025-01-19 (×2): 650 mg via ORAL
  Filled 2025-01-18 (×2): qty 2

## 2025-01-18 MED ORDER — OXYCODONE HCL 5 MG PO TABS: 5.0000 mg | ORAL_TABLET | Freq: Four times a day (QID) | ORAL | Status: DC | PRN

## 2025-01-18 MED ORDER — METHYLERGONOVINE MALEATE 0.2 MG/ML IJ SOLN: 0.2000 mg | Freq: Once | INTRAMUSCULAR | Status: DC

## 2025-01-18 MED ORDER — DIPHENHYDRAMINE HCL 50 MG/ML IJ SOLN: 12.5000 mg | INTRAMUSCULAR | Status: DC | PRN

## 2025-01-18 MED ORDER — DIPHENHYDRAMINE HCL 25 MG PO CAPS: 25.0000 mg | ORAL_CAPSULE | Freq: Four times a day (QID) | ORAL | Status: DC | PRN

## 2025-01-18 MED ORDER — SENNOSIDES-DOCUSATE SODIUM 8.6-50 MG PO TABS
2.0000 | ORAL_TABLET | ORAL | Status: DC
  Filled 2025-01-18: qty 2

## 2025-01-18 MED ORDER — SODIUM CHLORIDE 0.9 % IV SOLN: 250.0000 mL | INTRAVENOUS | Status: DC | PRN

## 2025-01-18 MED ORDER — ONDANSETRON HCL 4 MG/2ML IJ SOLN: 4.0000 mg | INTRAMUSCULAR | Status: DC | PRN

## 2025-01-18 MED ORDER — PRENATAL MULTIVITAMIN CH
1.0000 | ORAL_TABLET | Freq: Every day | ORAL | Status: DC
  Filled 2025-01-18: qty 1

## 2025-01-18 MED ORDER — SODIUM CHLORIDE 0.9% FLUSH: 3.0000 mL | INTRAVENOUS | Status: DC | PRN

## 2025-01-18 MED ORDER — MISOPROSTOL 200 MCG PO TABS
ORAL_TABLET | ORAL | Status: AC
  Filled 2025-01-18: qty 4

## 2025-01-18 MED ORDER — LIDOCAINE HCL (PF) 1 % IJ SOLN
INTRAMUSCULAR | Status: DC | PRN
  Administered 2025-01-18 (×2): 5 mL via EPIDURAL

## 2025-01-18 MED ORDER — FENTANYL-BUPIVACAINE-NACL 0.5-0.125-0.9 MG/250ML-% EP SOLN
12.0000 mL/h | EPIDURAL | Status: DC | PRN
  Administered 2025-01-18: 12 mL/h via EPIDURAL
  Filled 2025-01-18: qty 250

## 2025-01-18 MED ORDER — SODIUM CHLORIDE 0.9% FLUSH
3.0000 mL | Freq: Two times a day (BID) | INTRAVENOUS | Status: DC
  Administered 2025-01-19: 3 mL via INTRAVENOUS

## 2025-01-18 MED ORDER — WITCH HAZEL-GLYCERIN EX PADS: 1.0000 | MEDICATED_PAD | CUTANEOUS | Status: DC | PRN

## 2025-01-18 MED ORDER — FUROSEMIDE 20 MG PO TABS
20.0000 mg | ORAL_TABLET | Freq: Every day | ORAL | Status: DC
  Administered 2025-01-19 – 2025-01-20 (×2): 20 mg via ORAL
  Filled 2025-01-18 (×2): qty 1

## 2025-01-18 MED ORDER — LORATADINE 10 MG PO TABS
10.0000 mg | ORAL_TABLET | Freq: Every day | ORAL | Status: DC
  Administered 2025-01-18: 10 mg via ORAL
  Filled 2025-01-18: qty 1

## 2025-01-18 MED ORDER — METHYLERGONOVINE MALEATE 0.2 MG/ML IJ SOLN
INTRAMUSCULAR | Status: AC
  Administered 2025-01-18: 0.2 mg
  Filled 2025-01-18: qty 1

## 2025-01-18 MED ORDER — DIBUCAINE (PERIANAL) 1 % EX OINT: 1.0000 | TOPICAL_OINTMENT | CUTANEOUS | Status: DC | PRN

## 2025-01-18 MED ORDER — ONDANSETRON HCL 4 MG PO TABS: 4.0000 mg | ORAL_TABLET | ORAL | Status: DC | PRN

## 2025-01-18 MED ORDER — CEFAZOLIN SODIUM-DEXTROSE 2-4 GM/100ML-% IV SOLN: 2.0000 g | Freq: Once | INTRAVENOUS | Status: DC

## 2025-01-18 MED ORDER — SIMETHICONE 80 MG PO CHEW: 80.0000 mg | CHEWABLE_TABLET | ORAL | Status: DC | PRN

## 2025-01-18 MED ORDER — IBUPROFEN 800 MG PO TABS
800.0000 mg | ORAL_TABLET | Freq: Three times a day (TID) | ORAL | Status: DC
  Administered 2025-01-18 – 2025-01-20 (×5): 800 mg via ORAL
  Filled 2025-01-18 (×5): qty 1

## 2025-01-18 MED ORDER — POTASSIUM CHLORIDE CRYS ER 20 MEQ PO TBCR
20.0000 meq | EXTENDED_RELEASE_TABLET | Freq: Every day | ORAL | Status: DC
  Administered 2025-01-19: 20 meq via ORAL
  Filled 2025-01-18: qty 1

## 2025-01-18 MED ORDER — BENZOCAINE-MENTHOL 20-0.5 % EX AERO
1.0000 | INHALATION_SPRAY | CUTANEOUS | Status: DC | PRN
  Administered 2025-01-20: 1 via TOPICAL
  Filled 2025-01-18: qty 56

## 2025-01-18 MED ORDER — MEASLES, MUMPS & RUBELLA VAC ~~LOC~~ SUSR: 0.5000 mL | Freq: Once | SUBCUTANEOUS | Status: DC

## 2025-01-18 MED ORDER — LACTATED RINGERS IV SOLN
500.0000 mL | Freq: Once | INTRAVENOUS | Status: AC
  Administered 2025-01-18: 500 mL via INTRAVENOUS

## 2025-01-18 MED ORDER — TETANUS-DIPHTH-ACELL PERTUSSIS 5-2-15.5 LF-MCG/0.5 IM SUSP: 0.5000 mL | Freq: Once | INTRAMUSCULAR | Status: DC

## 2025-01-18 MED ORDER — COCONUT OIL OIL: 1.0000 | TOPICAL_OIL | Status: DC | PRN

## 2025-01-18 MED ORDER — PHENYLEPHRINE 80 MCG/ML (10ML) SYRINGE FOR IV PUSH (FOR BLOOD PRESSURE SUPPORT): 80.0000 ug | PREFILLED_SYRINGE | INTRAVENOUS | Status: DC | PRN

## 2025-01-18 MED ORDER — MISOPROSTOL 200 MCG PO TABS
800.0000 ug | ORAL_TABLET | Freq: Once | ORAL | Status: AC
  Administered 2025-01-18: 800 ug via RECTAL

## 2025-01-18 MED ORDER — NIFEDIPINE ER OSMOTIC RELEASE 30 MG PO TB24
30.0000 mg | ORAL_TABLET | Freq: Every day | ORAL | Status: DC
  Administered 2025-01-18 – 2025-01-20 (×3): 30 mg via ORAL
  Filled 2025-01-18 (×3): qty 1

## 2025-01-18 NOTE — Discharge Summary (Signed)
 "    Postpartum Discharge Summary  Date of Service updated***     Patient Name: Amy [name] DOB: November 23, 1996 MRN: [id number]  Date of admission: 01/17/2025 Delivery date:01/18/2025 Delivering provider: Tytionna Cloyd L Date of discharge: 01/18/2025  Admitting diagnosis: Indication for care in labor or delivery [O75.9] Intrauterine pregnancy: [redacted]w[redacted]d     Secondary diagnosis:  Active Problems:   * No active hospital problems. *  Additional problems: ***    Discharge diagnosis: {DX.:23714}                                              Post partum procedures:{Postpartum procedures:23558} Augmentation: AROM and Pitocin  Complications: None  Hospital course: Induction of Labor With Vaginal Delivery   29 y.o. yo G2P1001 at [redacted]w[redacted]d was admitted to the hospital 01/17/2025 for induction of labor.  Indication for induction: A2 DM.  Patient had an labor course complicated by development of pre-eclampsia without severe features by P:C ratio (0.5) Membrane Rupture Time/Date: 9:58 AM,01/18/2025  Delivery Method:Vaginal, Spontaneous Operative Delivery:N/A Episiotomy: None Lacerations:  2nd degree Details of delivery can be found in separate delivery note.  Patient had a postpartum course complicated by***. Patient is discharged home 01/18/25.    Newborn Data: Birth date:01/18/2025 Birth time:6:04 PM Gender:Female Living status:Living Apgars:8 ,9  Weight:   Magnesium  Sulfate received: {Mag received:30440022} BMZ received: No Rhophylac :{Rhophylac  received:30440032} MMR:N/A T-DaP:Declined prenatally, offered PP Flu: No RSV Vaccine received: No Transfusion:{Transfusion received:30440034}  Immunizations received: Immunization History  Administered Date(s) Administered   Rho (D) Immune Globulin  11/15/2020, 11/24/2024   Tdap 01/31/2023    Physical exam  Vitals:   01/18/25 1806 01/18/25 1812 01/18/25 1830 01/18/25 1845  BP: 129/80 132/74 125/72 127/65  Pulse: 90 90 79 74   Temp:      TempSrc:      SpO2:      Weight:      Height:       General: {Exam; general:21111117} Lochia: {Desc; appropriate/inappropriate:30686::appropriate} Uterine Fundus: {Desc; firm/soft:30687} Incision: {Exam; incision:21111123} DVT Evaluation: {Exam; dvt:2111122} Labs: Lab Results  Component Value Date   WBC 12.8 (H) 01/18/2025   HGB 11.1 (L) 01/18/2025   HCT 33.6 (L) 01/18/2025   MCV 81.6 01/18/2025   PLT 130 (L) 01/18/2025      Latest Ref Rng & Units 01/18/2025    4:34 PM  CMP  Glucose 70 - 99 mg/dL 897   BUN 6 - 20 mg/dL 11   Creatinine 9.55 - 1.00 mg/dL 9.28   Sodium 864 - 854 mmol/L 133   Potassium 3.5 - 5.1 mmol/L 4.9   Chloride 98 - 111 mmol/L 100   CO2 22 - 32 mmol/L 20   Calcium 8.9 - 10.3 mg/dL 8.6   Total Protein 6.5 - 8.1 g/dL 6.1   Total Bilirubin 0.0 - 1.2 mg/dL 0.3   Alkaline Phos 38 - 126 U/L 169   AST 15 - 41 U/L 26   ALT 0 - 44 U/L 29    Edinburgh Score:    02/15/2021    1:47 PM  Edinburgh Postnatal Depression Scale Screening Tool  I have been able to laugh and see the funny side of things. 0   I have looked forward with enjoyment to things. 0   I have blamed myself unnecessarily when things went wrong. 0   I have been anxious or worried for no  good reason. 0   I have felt scared or panicky for no good reason. 0   Things have been getting on top of me. 0   I have been so unhappy that I have had difficulty sleeping. 0   I have felt sad or miserable. 0   I have been so unhappy that I have been crying. 0   The thought of harming myself has occurred to me. 0   Edinburgh Postnatal Depression Scale Total 0      Data saved with a previous flowsheet row definition   No data recorded  After visit meds:  Allergies as of 01/18/2025       Reactions   Benzyl Alcohol    Burns skin   Cough Syrup [guaifenesin ] Nausea And Vomiting   Haemophilus Influenzae Vaccines    Hospitalized for 14 days asthma attack.  Allergic to ALL VACCINES   Other  Nausea And Vomiting   Any coated medications   Petrolatum    Wound Dressing Adhesive    Iodine Rash     Med Rec must be completed prior to using this SMARTLINK***        Discharge home in stable condition Infant Feeding: {Baby feeding:23562} Infant Disposition:{CHL IP OB HOME WITH FNUYZM:76418} Discharge instruction: per After Visit Summary and Postpartum booklet. Activity: Advance as tolerated. Pelvic rest for 6 weeks.  Diet: {OB ipzu:78888878} Future Appointments:No future appointments. Follow up Visit:   Please schedule this patient for a In person postpartum visit in 6 weeks with the following provider: Any provider. Additional Postpartum F/U:2 hour GTT and BP check 1 week  High risk pregnancy complicated by: GDM and HTN Delivery mode:  Vaginal, Spontaneous Anticipated Birth Control:  Condoms  Message sent to FT on 1/26.  01/18/2025 Barkley LITTIE Angles, MD    "

## 2025-01-18 NOTE — Progress Notes (Signed)
 Patient ID: Amy [name], female   DOB: 07-20-1996, 29 y.o.   MRN: [id number]  Blood pressure 123/68, pulse 82, temperature 97.6 F (36.4 C), temperature source Oral, height 5' 4 (1.626 m), weight 110 kg, last menstrual period 04/17/2024.  FHT: baseline 150/moderate variability/+accels/-decels Toco: ctx q3-20min  Dilation: 3.5 Effacement (%): 50 Station: -2 Presentation: Vertex Exam by:: Dr. Jomarie  Seen at bedside, patient resting. Pitocin  at 4 for augmentation. Plan to evaluate for AROM at next SVE. Has received two doses of PCN for GBS ppx.   Charlie DELENA Jomarie, MD

## 2025-01-18 NOTE — Anesthesia Procedure Notes (Signed)
 Epidural Patient location during procedure: OB Start time: 01/18/2025 8:19 AM End time: 01/18/2025 8:28 AM  Preanesthetic Checklist Completed: patient identified, IV checked, site marked, risks and benefits discussed, surgical consent, monitors and equipment checked, pre-op evaluation and timeout performed  Epidural Patient position: sitting Prep: DuraPrep and site prepped and draped Patient monitoring: continuous pulse ox and blood pressure Approach: midline Location: L3-L4 Injection technique: LOR air  Needle:  Needle type: Tuohy  Needle gauge: 17 G Needle length: 9 cm and 9 Needle insertion depth: 6 cm Catheter type: closed end flexible Catheter size: 19 Gauge Catheter at skin depth: 13 cm Test dose: negative and Other  Assessment Events: blood not aspirated, no cerebrospinal fluid, injection not painful, no injection resistance, no paresthesia and negative IV test  Additional Notes Patient identified. Risks and benefits discussed including failed block, incomplete  Pain control, post dural puncture headache, nerve damage, paralysis, blood pressure Changes, nausea, vomiting, reactions to medications-both toxic and allergic and post Partum back pain. All questions were answered. Patient expressed understanding and wished to proceed. Sterile technique was used throughout procedure. Epidural site was Dressed with sterile barrier dressing. No paresthesias, signs of intravascular injection Or signs of intrathecal spread were encountered.  Patient was more comfortable after the epidural was dosed. Please see RN's note for documentation of vital signs and FHR which are stable. Reason for block:procedure for pain

## 2025-01-18 NOTE — Anesthesia Preprocedure Evaluation (Addendum)
 "                                  Anesthesia Evaluation  Patient identified by MRN, date of birth, ID band Patient awake    Reviewed: Allergy & Precautions, Patient's Chart, lab work & pertinent test results  History of Anesthesia Complications (+) PONV, Family history of anesthesia reaction and history of anesthetic complications  Airway Mallampati: II  TM Distance: >3 FB     Dental no notable dental hx. (+) Teeth Intact   Pulmonary asthma , former smoker   Pulmonary exam normal breath sounds clear to auscultation       Cardiovascular + DVT  Normal cardiovascular exam Rhythm:Regular Rate:Normal  Hx/o DVT right femoral with right iliofemoral thrombectomy during pregnancy- refused lovenox  therapy   Neuro/Psych  Headaches PSYCHIATRIC DISORDERS Anxiety     PTSD   GI/Hepatic Neg liver ROS,GERD  ,,  Endo/Other  diabetes, Well Controlled, Gestational, Oral Hypoglycemic Agents  Class 3 obesity  Renal/GU negative Renal ROS Bladder dysfunction  Hx/o SUI with pregnancy    Musculoskeletal   Abdominal  (+) + obese  Peds  Hematology  (+) Blood dyscrasia, anemia , REFUSES BLOOD PRODUCTSLab Results      Component                Value               Date                      WBC                      9.7                 01/17/2025                HGB                      11.5 (L)            01/17/2025                HCT                      33.8 (L)            01/17/2025                MCV                      80.7                01/17/2025                PLT                      139 (L)             01/17/2025            Thrombocytopenia likely Gestational   Anesthesia Other Findings   Reproductive/Obstetrics (+) Pregnancy                              Anesthesia Physical Anesthesia Plan  ASA: 3  Anesthesia Plan: Epidural   Post-op Pain Management:    Induction: Intravenous  PONV Risk Score and Plan: 3 and Treatment may vary due  to age or medical condition  Airway Management Planned: Natural Airway  Additional Equipment: None and Fetal Monitoring  Intra-op Plan:   Post-operative Plan:   Informed Consent: I have reviewed the patients History and Physical, chart, labs and discussed the procedure including the risks, benefits and alternatives for the proposed anesthesia with the patient or authorized representative who has indicated his/her understanding and acceptance.       Plan Discussed with: Anesthesiologist  Anesthesia Plan Comments:          Anesthesia Quick Evaluation  "

## 2025-01-18 NOTE — Plan of Care (Signed)

## 2025-01-18 NOTE — Progress Notes (Signed)
 Patient ID: Courtany Mcmurphy Matchinis Rittenour, female   DOB: [birth year], 29 y.o.   MRN: [id number]  Blood pressure 123/68, pulse 82, temperature 97.6 F (36.4 C), temperature source Oral, height 5' 4 (1.626 m), weight 110 kg, last menstrual period 04/17/2024.  FHT: baseline 145/moderate variability/+accels/-decels Toco: ctx q48min  Dilation: 4.5 Effacement (%): 50 Station: -2 Presentation: Vertex Exam by:: Dr. Jomarie  Seen at bedside to discuss plan of care. SVE has progressed. Pitocin  currently at 12 for augmentation. Discussed AROM, patient would like epidural first. Plan for AROM after epidural placement. All questions answered.  Charlie DELENA Jomarie, MD

## 2025-01-18 NOTE — Progress Notes (Addendum)
 Labor Progress Note Sheva Mcdougle Matchinis Rittenour is a 29 y.o. G2P1001 at [redacted]w[redacted]d presented for IOL 2/2 A2GDM  O:  BP 128/82   Pulse 78   Temp 97.7 F (36.5 C) (Oral)   Ht 5' 4 (1.626 m)   Wt 110 kg   LMP 04/17/2024   SpO2 99%   BMI 41.64 kg/m   CVE: Dilation: 4.5 Effacement (%): 30 Station: -3 Presentation: Vertex Exam by:: Dr. Magali   A&P: 29 y.o. G2P1001 [redacted]w[redacted]d  #Labor: Pit at 2. Performed AROM @1000 . Clear fluid.  #Pain: s/p epidural #FWB: Cat 1 tracing   Leafy Scriver, DO 10:08 AM  Attestation of Supervision of Student:  I confirm that I have verified the information documented in the resident's note and that I have also personally performed the history, physical exam and all medical decision making activities.  I have verified that all services and findings are accurately documented in this student's note; and I agree with management and plan as outlined in the documentation. I have also made any necessary editorial changes.   Barkley LITTIE Magali, MD OB Fellow 01/18/2025 10:34 AM

## 2025-01-19 MED ORDER — ENOXAPARIN SODIUM 100 MG/ML IJ SOSY
100.0000 mg | PREFILLED_SYRINGE | Freq: Two times a day (BID) | INTRAMUSCULAR | Status: DC
  Administered 2025-01-19 (×2): 100 mg via SUBCUTANEOUS
  Filled 2025-01-19 (×4): qty 1

## 2025-01-19 MED ORDER — RHO D IMMUNE GLOBULIN 1500 UNIT/2ML IJ SOSY
300.0000 ug | PREFILLED_SYRINGE | Freq: Once | INTRAMUSCULAR | Status: AC
  Administered 2025-01-19: 300 ug via INTRAVENOUS
  Filled 2025-01-19: qty 2

## 2025-01-19 NOTE — Progress Notes (Signed)
 MOB was referred for history of depression/anxiety.  * Referral screened out by Clinical Social Worker because none of the following criteria appear to apply:  ~ History of anxiety/depression during this pregnancy, or of post-partum depression following prior delivery.  ~ Diagnosis of anxiety and/or depression within last 3 years  Per OB notes, MOB did not indicate any signs/symptoms during her pregnancy.  Edinburgh score 2  Anxiety 2021  OR  * MOB's symptoms currently being treated with medication and/or therapy.  Please contact the Clinical Social Worker if needs arise, by [facility] request, or if MOB scores greater than 9/yes to question 10 on Edinburgh Postpartum Depression Screen.  Rosina Molt, [place] Clinical Social Worker [phone]

## 2025-01-19 NOTE — Patient Instructions (Signed)
 If you are interested in an outpatient lactation consultation -- available in-office or virtually -- please reach out to us  at:  [facility] (First Floor) ?? [address], [place], [place]  ?? [phone] Please leave a message on our lactation voicemail box. We welcome any lactation-related questions or concerns -- our team is here to support you and your baby.  Lactation Support Groups Join us  at: [facility] ?? Tuesdays, 10:00 AM - 12:00 PM ?? [address], [place] [employer], [place]  Lactating parents and lap babies are welcome!  ?? [email or web address]  ?? [email or web address] -------------     Amy [name], IBCLC [facility]

## 2025-01-19 NOTE — Anesthesia Postprocedure Evaluation (Signed)
"   Anesthesia Post Note  Patient: Amy [name]  Procedure(s) Performed: AN AD HOC LABOR EPIDURAL     Anesthesia Type: Epidural Anesthetic complications: no   No notable events documented.  Last Vitals:  Vitals:   01/18/25 2215 01/19/25 0215  BP: (!) 145/87 138/72  Pulse: 89 85  Resp: 18 18  Temp: 37.3 C 36.9 C  SpO2:      Last Pain:  Vitals:   01/19/25 0215  TempSrc: Oral  PainSc: 0-No pain   Pain Goal:                   [name]      "

## 2025-01-19 NOTE — Progress Notes (Addendum)
 POSTPARTUM PROGRESS NOTE  Post Partum Day 1  Subjective:  Amy [name] is a 29 y.o. H7E7997 s/p SVD at [redacted]w[redacted]d.  No acute events overnight.  Pt denies problems with ambulating, voiding or po intake.  She denies nausea or vomiting.  Pain is well controlled.  She has not had flatus. She has not had bowel movement.  Lochia Small.   Objective: Blood pressure 120/70, pulse 76, temperature 98.1 F (36.7 C), temperature source Oral, resp. rate 18, height 5' 4 (1.626 m), weight 110 kg, last menstrual period 04/17/2024, SpO2 99%, unknown if currently breastfeeding.  Physical Exam:  General: alert, cooperative and no distress Chest: no respiratory distress Heart:regular rate, distal pulses intact Abdomen: soft, nontender,  Uterine Fundus: firm, appropriately tender DVT Evaluation: No calf swelling or tenderness Extremities: no edema Skin: warm, dry  Recent Labs    01/17/25 2252 01/18/25 1634  HGB 11.5* 11.1*  HCT 33.8* 33.6*    Assessment/Plan: Amy [name] is a 29 y.o. H7E7997 s/p SVD at [redacted]w[redacted]d   PPD#1 - Doing well, only concern is trouble w/ latching. Will involve lactation today. Pain well controlled.  Contraception: condoms Feeding: breast Pre eclampsia: Mild range BP since delivery, denies Pre E symptoms.  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  Dispo: Plan for discharge tomorrow.    LOS: 2 days   Manon Jester, DO, CNM 01/19/2025, 7:56 AM

## 2025-01-19 NOTE — Lactation Note (Signed)
 This note was copied from a baby's chart. Lactation Consultation Note  Patient Name: Amy [name] Date: 01/19/2025 Age:29 hours Reason for consult: Initial assessment;Early term 37-38.6wks;Maternal endocrine disorder  P2. Mom didn't BF her now 46 yr old 1st child. Mom has been trying to get baby to latch on the breast and hasn't been able to. LC attempted to get baby to latch and baby has a very tight mouth. He will not hardly even open wide when he cries. He cried loud once and opened wide and LC inserted nipple in his mouth and he wouldn't suck at all. LC attempted suck training w/gloved finger and LC had difficulty getting baby to open to insert finger then he wouldn't suckle on finger he only clamped hard w/gums. Mom stated when the baby cries he doesn't open his mouth wide. Mom asked for formula. LC got a bottle, sat baby upright and wiggled nipple in his mouth and he sucked on the bottle taking the formula well. Mom has been giving her EBM in a bottle and stated he would take it well but she couldn't get to latch on her breast. Baby isn't jittery which is good, LC had a concern d/t GDM and baby not eating much.  Newborn feeding habits, behavior, STS, I&O, pumping, supplementing reviewed. Mom stated she may just pump and bottle feed the baby. Mom encouraged to feed baby 8-12 times/24 hours and with feeding cues.  Mom shown how to use DEBP & how to disassemble, clean, & reassemble parts. Mom knows to pump every three hours. Encouraged mom to call for assistance or questions.  Maternal Data Has patient been taught Hand Expression?: Yes Does the patient have breastfeeding experience prior to this delivery?: No  Feeding Mother's Current Feeding Choice: Breast Milk and Formula Nipple Type: Extra Slow Flow  LATCH Score Latch: Too sleepy or reluctant, no latch achieved, no sucking elicited.  Audible Swallowing: None  Type of Nipple: Everted at rest and after  stimulation  Comfort (Breast/Nipple): Soft / non-tender  Hold (Positioning): Full assist, staff holds infant at breast  LATCH Score: 4   Lactation Tools Discussed/Used Tools: Pump;Flanges Flange Size: 18 Breast pump type: Double-Electric Breast Pump Pump Education: Setup, frequency, and cleaning;Milk Storage Reason for Pumping: pump and bottle feed Pumping frequency: q 3hr  Interventions Interventions: Breast feeding basics reviewed;Assisted with latch;Skin to skin;Breast massage;Hand express;Breast compression;Adjust position;Support pillows;Position options;Expressed milk;DEBP;Education;Pace feeding;LC Services brochure;CDC milk storage guidelines  Discharge Discharge Education: Outpatient recommendation Pump: Hands Free (mom cozy)  Consult Status Consult Status: Follow-up Date: 01/20/25 Follow-up type: In-patient    Doy Taaffe G 01/19/2025, 11:10 PM

## 2025-01-20 ENCOUNTER — Other Ambulatory Visit (HOSPITAL_COMMUNITY): Payer: Self-pay

## 2025-01-20 DIAGNOSIS — O1400 Mild to moderate pre-eclampsia, unspecified trimester: Secondary | ICD-10-CM | POA: Insufficient documentation

## 2025-01-20 LAB — RH IG WORKUP (INCLUDES ABO/RH)
Fetal Screen: NEGATIVE
Gestational Age(Wks): 38
Unit division: 0

## 2025-01-20 LAB — T.PALLIDUM AB, TOTAL: T Pallidum Abs: NONREACTIVE

## 2025-01-20 MED ORDER — POLYETHYLENE GLYCOL 3350 17 GM/SCOOP PO POWD
17.0000 g | Freq: Every day | ORAL | 1 refills | Status: AC | PRN
  Filled 2025-01-20: qty 476, 28d supply, fill #0

## 2025-01-20 MED ORDER — POLYETHYLENE GLYCOL 3350 17 GM/SCOOP PO POWD
17.0000 g | Freq: Every day | ORAL | 1 refills | Status: AC | PRN
  Filled 2025-01-20: qty 510, 30d supply, fill #0

## 2025-01-20 MED ORDER — ACETAMINOPHEN 325 MG PO TABS
650.0000 mg | ORAL_TABLET | ORAL | 0 refills | Status: AC | PRN
  Filled 2025-01-20: qty 30, 3d supply, fill #0

## 2025-01-20 MED ORDER — IBUPROFEN 800 MG PO TABS
800.0000 mg | ORAL_TABLET | Freq: Three times a day (TID) | ORAL | 0 refills | Status: AC
  Filled 2025-01-20: qty 30, 10d supply, fill #0

## 2025-01-20 MED ORDER — NIFEDIPINE ER 30 MG PO TB24
30.0000 mg | ORAL_TABLET | Freq: Every day | ORAL | 1 refills | Status: AC
  Filled 2025-01-20: qty 30, 30d supply, fill #0

## 2025-01-20 MED ORDER — ENOXAPARIN SODIUM 40 MG/0.4ML IJ SOSY: 40.0000 mg | PREFILLED_SYRINGE | Freq: Two times a day (BID) | INTRAMUSCULAR | Status: DC

## 2025-01-20 MED ORDER — FUROSEMIDE 20 MG PO TABS
20.0000 mg | ORAL_TABLET | Freq: Every day | ORAL | 0 refills | Status: AC
  Filled 2025-01-20: qty 3, 3d supply, fill #0

## 2025-01-20 MED ORDER — ENOXAPARIN SODIUM 100 MG/ML IJ SOSY
40.0000 mg | PREFILLED_SYRINGE | Freq: Two times a day (BID) | INTRAMUSCULAR | 0 refills | Status: AC
  Filled 2025-01-20: qty 24, 30d supply, fill #0

## 2025-01-20 MED ORDER — POTASSIUM CHLORIDE CRYS ER 20 MEQ PO TBCR
20.0000 meq | EXTENDED_RELEASE_TABLET | Freq: Every day | ORAL | 0 refills | Status: DC
  Filled 2025-01-20: qty 3, 3d supply, fill #0

## 2025-01-20 NOTE — Lactation Note (Signed)
 This note was copied from a baby's chart. Lactation Consultation Note  Patient Name: Boy Jessilynn Taft Matchinis Rittenour Unijb'd Date: 01/20/2025 Age:29 hours Reason for consult: Maternal endocrine disorder;Follow-up assessment;Early term 37-38.6wks;Exclusive pumping and bottle feeding (5 % weight loss, per mom baby went for a circ) Per mom has decided to just pump and bottle feed and has a electric pump.  LC reviewed breast feeding D/C teaching and the [facility] resources.  LC stressed the importance of pumping 8-10 times a day both breast for 15-20 mins.  Engorgement prevention and tx reviewed.   Maternal Data Has patient been taught Hand Expression?: Yes Does the patient have breastfeeding experience prior to this delivery?: No  Feeding Mother's Current Feeding Choice: Breast Milk and Formula Nipple Type: Slow - flow   Lactation Tools Discussed/Used Tools: Pump;Flanges Flange Size: 18 Breast pump type: Double-Electric Breast Pump Pump Education: Milk Storage;Setup, frequency, and cleaning  Interventions Interventions: Breast feeding basics reviewed;Education;DEBP;LC Services brochure;CDC milk storage guidelines;CDC Guidelines for Breast Pump Cleaning  Discharge Discharge Education: Engorgement and breast care;Warning signs for feeding baby;Outpatient recommendation (if needed, mom declined yesterday when the [facility] O/P asked her) Pump: Hands Free;Personal WIC Program: No  Consult Status Consult Status: Complete Date: 01/20/25    Rollene Jenkins Fiedler 01/20/2025, 10:24 AM

## 2025-01-21 ENCOUNTER — Encounter: Payer: Self-pay | Admitting: Obstetrics and Gynecology

## 2025-01-21 ENCOUNTER — Ambulatory Visit: Payer: Self-pay | Admitting: Obstetrics & Gynecology

## 2025-01-21 DIAGNOSIS — R7689 Other specified abnormal immunological findings in serum: Secondary | ICD-10-CM | POA: Insufficient documentation

## 2025-01-24 ENCOUNTER — Encounter: Payer: Self-pay | Admitting: *Deleted

## 2025-01-25 ENCOUNTER — Ambulatory Visit

## 2025-01-25 ENCOUNTER — Other Ambulatory Visit: Admitting: Radiology

## 2025-01-25 ENCOUNTER — Encounter: Admitting: Obstetrics & Gynecology

## 2025-01-26 ENCOUNTER — Telehealth (HOSPITAL_COMMUNITY): Payer: Self-pay | Admitting: *Deleted

## 2025-01-26 NOTE — Telephone Encounter (Signed)
 Attempted hospital discharge follow-up call. Left message for patient to return RN call with any questions or concerns. Allean IVAR Carton, RN, 01/26/25, [phone]

## 2025-01-27 ENCOUNTER — Other Ambulatory Visit (HOSPITAL_COMMUNITY): Payer: Self-pay

## 2025-01-28 ENCOUNTER — Ambulatory Visit

## 2025-01-29 ENCOUNTER — Ambulatory Visit

## 2025-01-29 VITALS — BP 120/85 | HR 105

## 2025-01-29 DIAGNOSIS — Z8759 Personal history of other complications of pregnancy, childbirth and the puerperium: Secondary | ICD-10-CM

## 2025-01-29 NOTE — Progress Notes (Cosign Needed)
" ° °  NURSE VISIT- BLOOD PRESSURE CHECK  SUBJECTIVE:  Amy [name] is a 29 y.o. [phone] female here for BP check. She is postpartum, delivery date 01/18/25    HYPERTENSION ROS:  Postpartum:  Severe headaches that don't go away with tylenol /other medicines: Yes  Visual changes (seeing spots/double/blurred vision) No  Severe pain under right breast breast or in center of upper chest No  Severe nausea/vomiting No  Taking medicines as instructed yes   OBJECTIVE:  BP 120/85 (BP Location: Right Arm, Patient Position: Sitting, Cuff Size: Large)   Pulse (!) 105   LMP 04/17/2024   Breastfeeding Yes   Appearance alert, well appearing, and in no distress.  ASSESSMENT: Postpartum  blood pressure check  PLAN: Discussed with Dr. Ozan   Recommendations: stop medicine 2 days before next visit   Follow-up: as scheduled   Harshita Bernales  01/29/2025 10:17 AM  "

## 2025-02-01 ENCOUNTER — Other Ambulatory Visit: Admitting: Radiology

## 2025-02-01 ENCOUNTER — Inpatient Hospital Stay (HOSPITAL_COMMUNITY): Admit: 2025-02-01 | Payer: Self-pay

## 2025-02-01 ENCOUNTER — Encounter: Admitting: Obstetrics & Gynecology

## 2025-02-22 ENCOUNTER — Other Ambulatory Visit

## 2025-02-22 ENCOUNTER — Ambulatory Visit: Admitting: Advanced Practice Midwife
